# Patient Record
Sex: Female | Born: 1970
Health system: Southern US, Community
[De-identification: ages and names within clinical notes are randomized; demographics above are authoritative.]

## PROBLEM LIST (undated history)

## (undated) DIAGNOSIS — F419 Anxiety disorder, unspecified: Secondary | ICD-10-CM

## (undated) DIAGNOSIS — K625 Hemorrhage of anus and rectum: Secondary | ICD-10-CM

## (undated) DIAGNOSIS — F410 Panic disorder [episodic paroxysmal anxiety] without agoraphobia: Secondary | ICD-10-CM

## (undated) DIAGNOSIS — I10 Essential (primary) hypertension: Secondary | ICD-10-CM

## (undated) DIAGNOSIS — K648 Other hemorrhoids: Secondary | ICD-10-CM

## (undated) DIAGNOSIS — R5383 Other fatigue: Secondary | ICD-10-CM

## (undated) DIAGNOSIS — F32A Depression, unspecified: Secondary | ICD-10-CM

## (undated) DIAGNOSIS — R300 Dysuria: Secondary | ICD-10-CM

## (undated) DIAGNOSIS — N189 Chronic kidney disease, unspecified: Secondary | ICD-10-CM

## (undated) DIAGNOSIS — E669 Obesity, unspecified: Secondary | ICD-10-CM

## (undated) DIAGNOSIS — Z87898 Personal history of other specified conditions: Secondary | ICD-10-CM

## (undated) DIAGNOSIS — E119 Type 2 diabetes mellitus without complications: Secondary | ICD-10-CM

## (undated) DIAGNOSIS — I517 Cardiomegaly: Secondary | ICD-10-CM

## (undated) DIAGNOSIS — D649 Anemia, unspecified: Secondary | ICD-10-CM

## (undated) DIAGNOSIS — R0602 Shortness of breath: Secondary | ICD-10-CM

## (undated) DIAGNOSIS — K635 Polyp of colon: Secondary | ICD-10-CM

## (undated) DIAGNOSIS — A5901 Trichomonal vulvovaginitis: Secondary | ICD-10-CM

## (undated) DIAGNOSIS — E8940 Asymptomatic postprocedural ovarian failure: Secondary | ICD-10-CM

## (undated) DIAGNOSIS — E785 Hyperlipidemia, unspecified: Secondary | ICD-10-CM

## (undated) DIAGNOSIS — Z7251 High risk heterosexual behavior: Secondary | ICD-10-CM

## (undated) DIAGNOSIS — R5381 Other malaise: Secondary | ICD-10-CM

## (undated) DIAGNOSIS — F329 Major depressive disorder, single episode, unspecified: Secondary | ICD-10-CM

## (undated) DIAGNOSIS — E282 Polycystic ovarian syndrome: Secondary | ICD-10-CM

## (undated) DIAGNOSIS — N838 Other noninflammatory disorders of ovary, fallopian tube and broad ligament: Secondary | ICD-10-CM

## (undated) HISTORY — DX: Hyperlipidemia, unspecified: E78.5

## (undated) HISTORY — DX: Other hemorrhoids: K64.8

## (undated) HISTORY — PX: ABDOMINAL HYSTERECTOMY: SHX81

## (undated) HISTORY — DX: Obesity, unspecified: E66.9

## (undated) HISTORY — DX: Other malaise: R53.81

## (undated) HISTORY — DX: Asymptomatic postprocedural ovarian failure: E89.40

## (undated) HISTORY — DX: Major depressive disorder, single episode, unspecified: F32.9

## (undated) HISTORY — DX: Cardiomegaly: I51.7

## (undated) HISTORY — DX: Personal history of other specified conditions: Z87.898

## (undated) HISTORY — DX: Depression, unspecified: F32.A

## (undated) HISTORY — DX: Chronic kidney disease, unspecified: N18.9

## (undated) HISTORY — DX: High risk heterosexual behavior: Z72.51

## (undated) HISTORY — DX: Other noninflammatory disorders of ovary, fallopian tube and broad ligament: N83.8

## (undated) HISTORY — DX: Anxiety disorder, unspecified: F41.9

## (undated) HISTORY — DX: Panic disorder (episodic paroxysmal anxiety): F41.0

## (undated) HISTORY — DX: Anemia, unspecified: D64.9

## (undated) HISTORY — DX: Polycystic ovarian syndrome: E28.2

## (undated) HISTORY — PX: TUBAL LIGATION: SHX77

## (undated) HISTORY — DX: Hemorrhage of anus and rectum: K62.5

## (undated) HISTORY — DX: Shortness of breath: R06.02

## (undated) HISTORY — PX: APPENDECTOMY: SHX54

## (undated) HISTORY — DX: Trichomonal vulvovaginitis: A59.01

## (undated) HISTORY — DX: Polyp of colon: K63.5

## (undated) HISTORY — DX: Dysuria: R30.0

## (undated) HISTORY — PX: CHOLECYSTECTOMY: SHX55

## (undated) HISTORY — DX: Other fatigue: R53.83

---

## 2012-12-21 ENCOUNTER — Other Ambulatory Visit: Payer: Self-pay

## 2012-12-21 DIAGNOSIS — Z1231 Encounter for screening mammogram for malignant neoplasm of breast: Secondary | ICD-10-CM

## 2013-01-16 ENCOUNTER — Ambulatory Visit: Payer: Self-pay

## 2013-02-27 ENCOUNTER — Ambulatory Visit
Admission: RE | Admit: 2013-02-27 | Discharge: 2013-02-27 | Disposition: A | Discharge status: Home / Self Care | Payer: BC Managed Care – PPO | Source: Ambulatory Visit

## 2013-02-27 DIAGNOSIS — Z1231 Encounter for screening mammogram for malignant neoplasm of breast: Secondary | ICD-10-CM

## 2013-05-02 ENCOUNTER — Emergency Department (HOSPITAL_COMMUNITY): Payer: BC Managed Care – PPO

## 2013-05-02 ENCOUNTER — Emergency Department (HOSPITAL_COMMUNITY)
Admission: EM | Admit: 2013-05-02 | Discharge: 2013-05-02 | Disposition: A | Discharge status: Home / Self Care | Payer: BC Managed Care – PPO | Attending: Emergency Medicine | Admitting: Emergency Medicine

## 2013-05-02 ENCOUNTER — Encounter (HOSPITAL_COMMUNITY): Payer: Self-pay

## 2013-05-02 DIAGNOSIS — Y93K1 Activity, walking an animal: Secondary | ICD-10-CM | POA: Insufficient documentation

## 2013-05-02 DIAGNOSIS — Y929 Unspecified place or not applicable: Secondary | ICD-10-CM | POA: Insufficient documentation

## 2013-05-02 DIAGNOSIS — I10 Essential (primary) hypertension: Secondary | ICD-10-CM | POA: Insufficient documentation

## 2013-05-02 DIAGNOSIS — E119 Type 2 diabetes mellitus without complications: Secondary | ICD-10-CM | POA: Insufficient documentation

## 2013-05-02 DIAGNOSIS — M25511 Pain in right shoulder: Secondary | ICD-10-CM

## 2013-05-02 DIAGNOSIS — W108XXA Fall (on) (from) other stairs and steps, initial encounter: Secondary | ICD-10-CM | POA: Insufficient documentation

## 2013-05-02 DIAGNOSIS — S46909A Unspecified injury of unspecified muscle, fascia and tendon at shoulder and upper arm level, unspecified arm, initial encounter: Secondary | ICD-10-CM | POA: Insufficient documentation

## 2013-05-02 DIAGNOSIS — S4980XA Other specified injuries of shoulder and upper arm, unspecified arm, initial encounter: Secondary | ICD-10-CM | POA: Insufficient documentation

## 2013-05-02 HISTORY — DX: Essential (primary) hypertension: I10

## 2013-05-02 HISTORY — DX: Type 2 diabetes mellitus without complications: E11.9

## 2013-05-02 MED ORDER — HYDROCODONE-ACETAMINOPHEN 5-325 MG PO TABS: 1.0000 | ORAL_TABLET | Freq: Four times a day (QID) | ORAL | Status: DC | PRN

## 2013-05-02 MED ORDER — KETOROLAC TROMETHAMINE 60 MG/2ML IM SOLN
60.0000 mg | Freq: Once | INTRAMUSCULAR | Status: AC
  Administered 2013-05-02: 60 mg via INTRAMUSCULAR
  Filled 2013-05-02: qty 2

## 2013-05-02 NOTE — ED Provider Notes (Signed)
Medical screening examination/treatment/procedure(s) were performed by non-physician practitioner and as supervising physician I was immediately available for consultation/collaboration.  Veryl Speak, MD 05/02/13 [phone]

## 2013-05-02 NOTE — ED Notes (Signed)
Patient transported to X-ray 

## 2013-05-02 NOTE — ED Provider Notes (Signed)
CSN: [id number]     Arrival date & time 05/02/13  [id number] History   First MD Initiated Contact with Patient 05/02/13 0654     Chief Complaint  Patient presents with  . Shoulder Pain   (Consider location/radiation/quality/duration/timing/severity/associated sxs/prior Treatment) HPI Comments: Patient is a 42 year old female with history of diabetes and hypertension who presents today with right shoulder pain after a fall last name. She was taking her dog for a walk when the dog pulled her. She landed on her right shoulder. No loss of consciousness, disorientation. She describes the pain in her shoulder as a toothache that does not radiate out of the shoulder. The pain is worse with movement. She is holding her arm in internal rotation and flexion. This is the most comfortable position. She took 800 mg of ibuprofen which did not help her pain. She has never injured the shoulder in the past. She denies fever, chills, nausea, vomiting abdominal pain, numbness, weakness, paresthesias.  Patient is a 42 y.o. female presenting with shoulder pain. The history is provided by the patient. No language interpreter was used.  Shoulder Pain Associated symptoms include arthralgias, joint swelling and myalgias. Pertinent negatives include no chills, fever, headaches, nausea or vomiting.    Past Medical History  Diagnosis Date  . Diabetes mellitus without complication   . Hypertension    Past Surgical History  Procedure Laterality Date  . Abdominal hysterectomy      partial  . Appendectomy    . Cholecystectomy     No family history on file. History  Substance Use Topics  . Smoking status: Never Smoker   . Smokeless tobacco: Not on file  . Alcohol Use: No   OB History   Grav Para Term Preterm Abortions TAB SAB Ect Mult Living                 Review of Systems  Constitutional: Negative for fever and chills.  Gastrointestinal: Negative for nausea and vomiting.  Musculoskeletal: Positive for myalgias,  joint swelling and arthralgias.  Neurological: Negative for headaches.  All other systems reviewed and are negative.    Allergies  Review of patient's allergies indicates no known allergies.  Home Medications  No current outpatient prescriptions on file. BP 176/100  Pulse 95  Temp(Src) 98.6 F (37 C) (Oral)  Resp 16  SpO2 98% Physical Exam  Nursing note and vitals reviewed. Constitutional: She is oriented to person, place, and time. She appears well-developed and well-nourished. No distress.  HENT:  Head: Normocephalic and atraumatic.  Right Ear: External ear normal.  Left Ear: External ear normal.  Nose: Nose normal.  Mouth/Throat: Oropharynx is clear and moist.  Eyes: Conjunctivae are normal.  Neck: Normal range of motion.  Cardiovascular: Normal rate, regular rhythm, normal heart sounds, intact distal pulses and normal pulses.   Pulmonary/Chest: Effort normal and breath sounds normal. No stridor. No respiratory distress. She has no wheezes. She has no rales.  Abdominal: Soft. She exhibits no distension.  Musculoskeletal:       Right shoulder: She exhibits decreased range of motion, tenderness, bony tenderness, swelling and pain. She exhibits normal pulse.  Neurological: She is alert and oriented to person, place, and time. She has normal strength.  Skin: Skin is warm and dry. She is not diaphoretic. No erythema.  Psychiatric: She has a normal mood and affect. Her behavior is normal.    ED Course  Procedures (including critical care time) Labs Review Labs Reviewed - No data to display Imaging  Review Dg Shoulder Right  05/02/2013   CLINICAL DATA:  [place] down stairs. Right shoulder injury and pain.  EXAM: RIGHT SHOULDER - 2+ VIEW  COMPARISON:  None.  FINDINGS: There is no evidence of fracture or dislocation. There is no evidence of arthropathy or other focal bone abnormality. Soft tissues are unremarkable.  IMPRESSION: Negative.   Electronically Signed   By: Earle Gell    On: 05/02/2013 08:15    MDM   1. Right shoulder pain    Patient with right shoulder pain after fall. ROM limited. Neurovascularly intact. Compartment soft. Patient feeling some improvement after toradol. XR negative for fx, dislocation, or subluxation. Will given ortho referral if sx are not improving. Return instructions given. Vital signs stable for discharge. Patient / Family / Caregiver informed of clinical course, understand medical decision-making process, and agree with plan.     Elwyn Lade, PA-C 05/02/13 [phone]

## 2013-05-02 NOTE — ED Notes (Signed)
Pt. Reports that last night she fell down the steps and landed on right shoulder. Right shoulder swollen.

## 2013-05-02 NOTE — ED Notes (Signed)
Pillows given for support and ice pack applied to shoulder. Redness noted along right side base of neck with tenderness.

## 2013-05-02 NOTE — ED Notes (Signed)
Pt. Fell down stairs at apt complex last night and hit her right shoulder. Swelling noted, denies numbness/tingling distal to injury, radial pulse strong, cap refill instant. Pt. Took 800mg  ibuprofen around 0300.

## 2014-01-05 ENCOUNTER — Ambulatory Visit: Payer: Self-pay | Admitting: Podiatrist

## 2014-03-01 ENCOUNTER — Ambulatory Visit: Payer: Self-pay | Admitting: Podiatrist

## 2014-08-07 ENCOUNTER — Other Ambulatory Visit: Payer: Self-pay

## 2014-08-07 DIAGNOSIS — N644 Mastodynia: Secondary | ICD-10-CM

## 2014-10-10 DIAGNOSIS — R55 Syncope and collapse: Secondary | ICD-10-CM | POA: Insufficient documentation

## 2014-10-10 DIAGNOSIS — D72829 Elevated white blood cell count, unspecified: Secondary | ICD-10-CM | POA: Insufficient documentation

## 2014-10-10 DIAGNOSIS — N179 Acute kidney failure, unspecified: Secondary | ICD-10-CM | POA: Insufficient documentation

## 2015-06-24 ENCOUNTER — Emergency Department (HOSPITAL_COMMUNITY)
Admission: EM | Admit: 2015-06-24 | Discharge: 2015-06-24 | Disposition: A | Discharge status: Home / Self Care | Payer: BLUE CROSS/BLUE SHIELD | Attending: Emergency Medicine | Admitting: Emergency Medicine

## 2015-06-24 ENCOUNTER — Encounter (HOSPITAL_COMMUNITY): Payer: Self-pay | Admitting: Cardiology

## 2015-06-24 ENCOUNTER — Emergency Department (HOSPITAL_COMMUNITY): Payer: BLUE CROSS/BLUE SHIELD

## 2015-06-24 DIAGNOSIS — R0789 Other chest pain: Secondary | ICD-10-CM | POA: Diagnosis not present

## 2015-06-24 DIAGNOSIS — E1165 Type 2 diabetes mellitus with hyperglycemia: Secondary | ICD-10-CM | POA: Diagnosis not present

## 2015-06-24 DIAGNOSIS — Z79899 Other long term (current) drug therapy: Secondary | ICD-10-CM | POA: Insufficient documentation

## 2015-06-24 DIAGNOSIS — R739 Hyperglycemia, unspecified: Secondary | ICD-10-CM

## 2015-06-24 DIAGNOSIS — I10 Essential (primary) hypertension: Secondary | ICD-10-CM | POA: Diagnosis not present

## 2015-06-24 DIAGNOSIS — I160 Hypertensive urgency: Secondary | ICD-10-CM

## 2015-06-24 LAB — BASIC METABOLIC PANEL
Anion gap: 12 (ref 5–15)
BUN: 14 mg/dL (ref 6–20)
CO2: 21 mmol/L — ABNORMAL LOW (ref 22–32)
Calcium: 10.5 mg/dL — ABNORMAL HIGH (ref 8.9–10.3)
Chloride: 100 mmol/L — ABNORMAL LOW (ref 101–111)
Creatinine, Ser: 1.31 mg/dL — ABNORMAL HIGH (ref 0.44–1.00)
GFR calc Af Amer: 56 mL/min — ABNORMAL LOW (ref >60)
GFR, EST NON AFRICAN AMERICAN: 48 mL/min — AB (ref >60)
Glucose, Bld: 319 mg/dL — ABNORMAL HIGH (ref 65–99)
POTASSIUM: 3.7 mmol/L (ref 3.5–5.1)
SODIUM: 133 mmol/L — AB (ref 135–145)

## 2015-06-24 LAB — URINALYSIS, ROUTINE W REFLEX MICROSCOPIC
BILIRUBIN URINE: NEGATIVE
Glucose, UA: >1000 mg/dL — AB
Hgb urine dipstick: NEGATIVE
KETONES UR: NEGATIVE mg/dL
Leukocytes, UA: NEGATIVE
NITRITE: NEGATIVE
PROTEIN: 30 mg/dL — AB
SPECIFIC GRAVITY, URINE: 1.025 (ref 1.005–1.030)
UROBILINOGEN UA: 0.2 mg/dL (ref 0.0–1.0)
pH: 5 (ref 5.0–8.0)

## 2015-06-24 LAB — CBG MONITORING, ED
GLUCOSE-CAPILLARY: 318 mg/dL — AB (ref 65–99)
Glucose-Capillary: 247 mg/dL — ABNORMAL HIGH (ref 65–99)
Glucose-Capillary: 250 mg/dL — ABNORMAL HIGH (ref 65–99)

## 2015-06-24 LAB — CBC
HEMATOCRIT: 37.5 % (ref 36.0–46.0)
Hemoglobin: 12.5 g/dL (ref 12.0–15.0)
MCH: 27.1 pg (ref 26.0–34.0)
MCHC: 33.3 g/dL (ref 30.0–36.0)
MCV: 81.2 fL (ref 78.0–100.0)
PLATELETS: 307 10*3/uL (ref 150–400)
RBC: 4.62 MIL/uL (ref 3.87–5.11)
RDW: 14.3 % (ref 11.5–15.5)
WBC: 11.1 10*3/uL — AB (ref 4.0–10.5)

## 2015-06-24 LAB — TROPONIN I: Troponin I: <0.03 ng/mL (ref <0.031)

## 2015-06-24 LAB — URINE MICROSCOPIC-ADD ON

## 2015-06-24 MED ORDER — AMLODIPINE BESYLATE 5 MG PO TABS: 10.0000 mg | ORAL_TABLET | Freq: Two times a day (BID) | ORAL | Status: DC

## 2015-06-24 MED ORDER — LOSARTAN POTASSIUM 50 MG PO TABS
100.0000 mg | ORAL_TABLET | Freq: Every day | ORAL | Status: DC
  Filled 2015-06-24: qty 2

## 2015-06-24 MED ORDER — SODIUM CHLORIDE 0.9 % IV BOLUS (SEPSIS)
1000.0000 mL | Freq: Once | INTRAVENOUS | Status: AC
  Administered 2015-06-24: 1000 mL via INTRAVENOUS

## 2015-06-24 MED ORDER — CITALOPRAM HYDROBROMIDE 10 MG PO TABS: 10.0000 mg | ORAL_TABLET | Freq: Every day | ORAL | Status: DC

## 2015-06-24 MED ORDER — LOSARTAN POTASSIUM-HCTZ 100-12.5 MG PO TABS: 1.0000 | ORAL_TABLET | Freq: Every day | ORAL | Status: DC

## 2015-06-24 MED ORDER — METFORMIN HCL 500 MG PO TABS: 500.0000 mg | ORAL_TABLET | Freq: Two times a day (BID) | ORAL | Status: DC

## 2015-06-24 MED ORDER — HYDROCHLOROTHIAZIDE 12.5 MG PO CAPS: 12.5000 mg | ORAL_CAPSULE | Freq: Every day | ORAL | Status: DC

## 2015-06-24 NOTE — ED Notes (Signed)
Discharge instructions reviewed - voiced understanding.  Patient will call to make an appointment

## 2015-06-24 NOTE — ED Notes (Signed)
Pt CBG is 250. Nurse notified.

## 2015-06-24 NOTE — Discharge Instructions (Signed)
Blood Glucose Monitoring, Adult Monitoring your blood glucose (also know as blood sugar) helps you to manage your diabetes. It also helps you and your health care provider monitor your diabetes and determine how well your treatment plan is working. WHY SHOULD YOU MONITOR YOUR BLOOD GLUCOSE?  It can help you understand how food, exercise, and medicine affect your blood glucose.  It allows you to know what your blood glucose is at any given moment. You can quickly tell if you are having low blood glucose (hypoglycemia) or high blood glucose (hyperglycemia).  It can help you and your health care provider know how to adjust your medicines.  It can help you understand how to manage an illness or adjust medicine for exercise. WHEN SHOULD YOU TEST? Your health care provider will help you decide how often you should check your blood glucose. This may depend on the type of diabetes you have, your diabetes control, or the types of medicines you are taking. Be sure to write down all of your blood glucose readings so that this information can be reviewed with your health care provider. See below for examples of testing times that your health care provider may suggest. Type 1 Diabetes  Test at least 2 times per day if your diabetes is well controlled, if you are using an insulin pump, or if you perform multiple daily injections.  If your diabetes is not well controlled or if you are sick, you may need to test more often.  It is a good idea to also test:  Before every insulin injection.  Before and after exercise.  Between meals and 2 hours after a meal.  Occasionally between 2:00 a.m. and 3:00 a.m. Type 2 Diabetes  If you are taking insulin, test at least 2 times per day. However, it is best to test before every insulin injection.  If you take medicines by mouth (orally), test 2 times a day.  If you are on a controlled diet, test once a day.  If your diabetes is not well controlled or if you  are sick, you may need to monitor more often. HOW TO MONITOR YOUR BLOOD GLUCOSE Supplies Needed  Blood glucose meter.  Test strips for your meter. Each meter has its own strips. You must use the strips that go with your own meter.  A pricking needle (lancet).  A device that holds the lancet (lancing device).  A journal or log book to write down your results. Procedure  Wash your hands with soap and water. Alcohol is not preferred.  Prick the side of your finger (not the tip) with the lancet.  Gently milk the finger until a small drop of blood appears.  Follow the instructions that come with your meter for inserting the test strip, applying blood to the strip, and using your blood glucose meter. Other Areas to Get Blood for Testing Some meters allow you to use other areas of your body (other than your finger) to test your blood. These areas are called alternative sites. The most common alternative sites are:  The forearm.  The thigh.  The back area of the lower leg.  The palm of the hand. The blood flow in these areas is slower. Therefore, the blood glucose values you get may be delayed, and the numbers are different from what you would get from your fingers. Do not use alternative sites if you think you are having hypoglycemia. Your reading will not be accurate. Always use a finger if you are  having hypoglycemia. Also, if you cannot feel your lows (hypoglycemia unawareness), always use your fingers for your blood glucose checks. ADDITIONAL TIPS FOR GLUCOSE MONITORING  Do not reuse lancets.  Always carry your supplies with you.  All blood glucose meters have a 24-hour "hotline" number to call if you have questions or need help.  Adjust (calibrate) your blood glucose meter with a control solution after finishing a few boxes of strips. BLOOD GLUCOSE RECORD KEEPING It is a good idea to keep a daily record or log of your blood glucose readings. Most glucose meters, if not all,  keep your glucose records stored in the meter. Some meters come with the ability to download your records to your home computer. Keeping a record of your blood glucose readings is especially helpful if you are wanting to look for patterns. Make notes to go along with the blood glucose readings because you might forget what happened at that exact time. Keeping good records helps you and your health care provider to work together to achieve good diabetes management.    This information is not intended to replace advice given to you by your health care provider. Make sure you discuss any questions you have with your health care provider.   Document Released: 08/20/2003 Document Revised: 09/07/2014 Document Reviewed: 01/09/2013 Elsevier Interactive Patient Education 2016 Elsevier Inc.  Chest Wall Pain Chest wall pain is pain in or around the bones and muscles of your chest. Sometimes, an injury causes this pain. Sometimes, the cause may not be known. This pain may take several weeks or longer to get better. HOME CARE INSTRUCTIONS  Pay attention to any changes in your symptoms. Take these actions to help with your pain:   Rest as told by your health care provider.   Avoid activities that cause pain. These include any activities that use your chest muscles or your abdominal and side muscles to lift heavy items.   If directed, apply ice to the painful area:  Put ice in a plastic bag.  Place a towel between your skin and the bag.  Leave the ice on for 20 minutes, 2-3 times per day.  Take over-the-counter and prescription medicines only as told by your health care provider.  Do not use tobacco products, including cigarettes, chewing tobacco, and e-cigarettes. If you need help quitting, ask your health care provider.  Keep all follow-up visits as told by your health care provider. This is important. SEEK MEDICAL CARE IF:  You have a fever.  Your chest pain becomes worse.  You have new  symptoms. SEEK IMMEDIATE MEDICAL CARE IF:  You have nausea or vomiting.  You feel sweaty or light-headed.  You have a cough with phlegm (sputum) or you cough up blood.  You develop shortness of breath.   This information is not intended to replace advice given to you by your health care provider. Make sure you discuss any questions you have with your health care provider.   Document Released: 08/17/2005 Document Revised: 05/08/2015 Document Reviewed: 11/12/2014 Elsevier Interactive Patient Education [employer].

## 2015-06-24 NOTE — ED Notes (Signed)
Reports she has been feeling bad since Thursday. Her CBG was 501 at work that night, has been running high since then. Reports feeling tried, and nauseated.

## 2015-06-24 NOTE — ED Provider Notes (Signed)
CSN: [id number]     Arrival date & time 06/24/15  1358 History   First MD Initiated Contact with Patient 06/24/15 1542     Chief Complaint  Patient presents with  . Hyperglycemia   HPI Patient presents the emergency department for assessment of fatigue and nausea and chest tightness the past 3 days. Patient states that her blood sugars have been significantly elevated running above 500s past 3 days as well. States that fatigue and nausea are also associated with an increase urinary frequency that she states is related. Patient denies dysuria or blood in urine denies vaginal bleeding or vaginal discharge no pelvic or abdominal pain. Patient has no bilateral lower back pain or flank pain. Patient states chest pain is not related to exertion is described as a generalized tightness in chest. Patient has had this sensation in several times in past and has been worked up prior including an syncopal event she states during which they were unable to find any etiology. Patient states that she does not have menstrual periods any longer as she has had a bilateral tubal ligation and partial hysterectomy however ovaries are present. Patient takes no estrogen supplementation has been on no recent antibiotics and has no history of recurrent UTIs. Patient states no family history of cardiac disease and no sudden unexplained tests in family members. States she has no sensation of chest pain at this time and took nothing to resolve this.  Past Medical History  Diagnosis Date  . Diabetes mellitus without complication ([place])   . Hypertension    Past Surgical History  Procedure Laterality Date  . Abdominal hysterectomy      partial  . Appendectomy    . Cholecystectomy     History reviewed. No pertinent family history. Social History  Substance Use Topics  . Smoking status: Never Smoker   . Smokeless tobacco: None  . Alcohol Use: No   OB History    No data available     Review of Systems  Constitutional:  Positive for fatigue. Negative for fever.  Respiratory: Positive for chest tightness. Negative for cough, choking, shortness of breath, wheezing and stridor.   Cardiovascular: Negative for chest pain, palpitations and leg swelling.  Gastrointestinal: Positive for nausea. Negative for vomiting, abdominal pain, diarrhea, blood in stool and abdominal distention.  Genitourinary: Positive for frequency. Negative for dysuria, urgency, hematuria, flank pain, decreased urine volume, vaginal bleeding, vaginal discharge, difficulty urinating, vaginal pain, menstrual problem and pelvic pain.  Musculoskeletal: Negative for back pain, neck pain and neck stiffness.  Skin: Negative for rash and wound.  Neurological: Negative for dizziness, seizures, syncope, weakness, light-headedness, numbness and headaches.  All other systems reviewed and are negative.  Allergies  Review of patient's allergies indicates no known allergies.  Home Medications   Prior to Admission medications   Medication Sig Start Date End Date Taking? Authorizing Provider  ALPRAZolam Duanne Moron) 0.5 MG tablet Take 0.5 mg by mouth 3 (three) times daily as needed for anxiety or sleep.   Yes Historical Provider, MD  amLODipine (NORVASC) 10 MG tablet Take 10 mg by mouth 2 (two) times daily.    Yes Historical Provider, MD  losartan-hydrochlorothiazide (HYZAAR) 100-12.5 MG tablet Take 1 tablet by mouth daily.   Yes Historical Provider, MD  metFORMIN (GLUCOPHAGE) 500 MG tablet Take 500 mg by mouth 2 (two) times daily with a meal.   Yes Historical Provider, MD  HYDROcodone-acetaminophen (NORCO/VICODIN) 5-325 MG per tablet Take 1 tablet by mouth every 6 (six) hours  as needed for pain. 05/02/13   Cleatrice Burke, PA-C  ibuprofen (ADVIL,MOTRIN) 200 MG tablet Take 800 mg by mouth every 6 (six) hours as needed for pain.    Historical Provider, MD   BP 173/93 mmHg  Pulse 89  Temp(Src) 98.2 F (36.8 C) (Oral)  Resp 17  Ht 5\' 6"  (1.676 m)  Wt 208 lb 6 oz  (94.518 kg)  BMI 33.65 kg/m2  SpO2 100% Physical Exam  Constitutional: She is oriented to person, place, and time. She appears well-developed and well-nourished.  Non-toxic appearance. She does not appear ill. No distress.  HENT:  Head: Normocephalic and atraumatic.  Right Ear: External ear normal.  Left Ear: External ear normal.  Eyes: Pupils are equal, round, and reactive to light. No scleral icterus.  Neck: Normal range of motion. Neck supple. No tracheal deviation present.  Cardiovascular: Normal heart sounds and intact distal pulses.   No murmur heard. Pulmonary/Chest: Effort normal and breath sounds normal. No stridor. No respiratory distress. She has no wheezes. She has no rales.  Abdominal: Soft. Bowel sounds are normal. She exhibits no distension. There is no tenderness. There is no rebound and no guarding.  Musculoskeletal: Normal range of motion.  Neurological: She is alert and oriented to person, place, and time. She has normal strength and normal reflexes. No cranial nerve deficit or sensory deficit.  Skin: Skin is warm and dry. No pallor.  Psychiatric: She has a normal mood and affect. Her behavior is normal.    ED Course  Procedures (including critical care time) Labs Review Labs Reviewed  BASIC METABOLIC PANEL - Abnormal; Notable for the following:    Sodium 133 (*)    Chloride 100 (*)    CO2 21 (*)    Glucose, Bld 319 (*)    Creatinine, Ser 1.31 (*)    Calcium 10.5 (*)    GFR calc non Af Amer 48 (*)    GFR calc Af Amer 56 (*)    All other components within normal limits  CBC - Abnormal; Notable for the following:    WBC 11.1 (*)    All other components within normal limits  URINALYSIS, ROUTINE W REFLEX MICROSCOPIC (NOT AT [facility]) - Abnormal; Notable for the following:    APPearance HAZY (*)    Glucose, UA >1000 (*)    Protein, ur 30 (*)    All other components within normal limits  URINE MICROSCOPIC-ADD ON - Abnormal; Notable for the following:    Squamous  Epithelial / LPF MANY (*)    Bacteria, UA FEW (*)    Casts HYALINE CASTS (*)    All other components within normal limits  CBG MONITORING, ED - Abnormal; Notable for the following:    Glucose-Capillary 318 (*)    All other components within normal limits  CBG MONITORING, ED - Abnormal; Notable for the following:    Glucose-Capillary 247 (*)    All other components within normal limits  CBG MONITORING, ED - Abnormal; Notable for the following:    Glucose-Capillary 250 (*)    All other components within normal limits  URINE CULTURE  TROPONIN I    Imaging Review Dg Chest 1 View  06/24/2015  CLINICAL DATA:  Hyperglycemia today, chest pain, shortness of breath, hypertension, diabetes mellitus EXAM: CHEST 1 VIEW COMPARISON:  None FINDINGS: Upper normal heart size. Mediastinal contours and pulmonary vascularity normal. Minimal peribronchial thickening. No pulmonary infiltrate, pleural effusion or pneumothorax. Bones unremarkable. IMPRESSION: Minimal bronchitic changes without infiltrate. Electronically  Signed   By: Lavonia Dana M.D.   On: 06/24/2015 16:58   I have personally reviewed and evaluated these images and lab results as part of my medical decision-making.   EKG Interpretation   Date/Time:  Monday June 24 2015 15:51:35 EDT Ventricular Rate:  83 PR Interval:  154 QRS Duration: 77 QT Interval:  381 QTC Calculation: 448 R Axis:   45 Text Interpretation:  Sinus rhythm No acute changes No old tracing to  compare Confirmed by Kathrynn Humble, MD, Thelma Comp [phone]) on 06/24/2015 4:28:47 PM      MDM   Casey Acosta is a 44 y.o. female with H&P as above. ED clinical course as follows:  DDx includes medication non-compliance, infection (PNA, UTI, Cellulitis), steroid induced, or DKA and HHNKS.  Patient is known Type 2 Diabetic.  Regimen involves metformin, with endorsed good compliance. No recent change in medications and no recent steroid course.  Patient without evidence of  acidosis or other metabolic abnormalities, and given without Hx of insulin dependence do not suspect DKA. HHNKS also unlikely.  CBC reveals WBC unremarkable. Exam reveals no concerning source of infection. Without evidence of acute infection on UA or CXR. Without AMS, hypotension or poor clinical perfusion therefore Sepsis or Septic shock not likely.  ED course inconsistent with considered emergent conditions, and thus I believe a low risk diagnosis much more likely.  Symptoms are controlled with IVF boluses which partial resolved the levated BGs.   Given atypical nature and low cardiac Hx do not suspect etiology of chest discomfort is cardiac.  Required no ASA or nitro given atypical symptoms.   Without peritoneal signs, rebound, or guarding on exam; do not suspect Intraabdominal source.  ECG is wthout anatomical ST segment or T wave changes of myocardial ischemia.  No evidence of Pericarditis.  Without leukocytosis, cough, or fever. Chest x-ray was done and evaluated by me, which showed no signs of focal airspace disease or infiltrate concerning for Pneumonia. Without Pneumothorax.   Patient is PERC negative, and low risk per [place] and have low suspicion for Pulmonary Embolism.    Patient is (total 3) low risk HEART score and had delta troponins which were both negative.  Clinical Impression:  1. Hypertensive urgency   2. Atypical chest pain   3. Hyperglycemia without ketosis    Disposition:  I explained the differential diagnoses along with likely diagnosis and have given explicit precautions to return to the ER including any other new or worsening symptoms. The patient understands and I both nursing and I confirmed there are no further concerns or questions. Discharge instructions concerning symptomatic care and follow up have been given.  The patient is STABLE and is discharged to home in good condition.  Laboratory and Imaging results were personally reviewed by myself and  used in the medical decision making of this patient's treatment and disposition. I also reviewed Radiology's interpretation of Imaging.   Patient care discussed with Dr. Kathrynn Humble, who oversaw their evaluation & treatment & voiced agreement. House Officer: Voncille Lo, MD, Emergency Medicine.  Voncille Lo, MD 06/25/15 [place], MD 07/12/15 1517

## 2015-06-25 ENCOUNTER — Ambulatory Visit: Payer: Self-pay | Admitting: Podiatry

## 2015-06-26 LAB — URINE CULTURE

## 2015-06-28 ENCOUNTER — Encounter: Payer: Self-pay | Admitting: Cardiovascular Disease

## 2015-07-11 ENCOUNTER — Ambulatory Visit (INDEPENDENT_AMBULATORY_CARE_PROVIDER_SITE_OTHER): Payer: BLUE CROSS/BLUE SHIELD | Admitting: Cardiology

## 2015-07-11 DIAGNOSIS — R072 Precordial pain: Secondary | ICD-10-CM

## 2015-07-11 NOTE — Progress Notes (Signed)
No show

## 2015-07-18 ENCOUNTER — Encounter: Payer: Self-pay | Admitting: Cardiology

## 2015-07-30 ENCOUNTER — Ambulatory Visit: Payer: Self-pay | Admitting: Podiatry

## 2017-10-16 ENCOUNTER — Emergency Department (HOSPITAL_COMMUNITY): Payer: BLUE CROSS/BLUE SHIELD

## 2017-10-16 ENCOUNTER — Emergency Department (HOSPITAL_COMMUNITY)
Admission: EM | Admit: 2017-10-16 | Discharge: 2017-10-16 | Disposition: A | Discharge status: Home / Self Care | Payer: BLUE CROSS/BLUE SHIELD | Attending: Emergency Medicine | Admitting: Emergency Medicine

## 2017-10-16 ENCOUNTER — Encounter (HOSPITAL_COMMUNITY): Payer: Self-pay | Admitting: Emergency Medicine

## 2017-10-16 DIAGNOSIS — N189 Chronic kidney disease, unspecified: Secondary | ICD-10-CM | POA: Insufficient documentation

## 2017-10-16 DIAGNOSIS — M25512 Pain in left shoulder: Secondary | ICD-10-CM | POA: Insufficient documentation

## 2017-10-16 DIAGNOSIS — I129 Hypertensive chronic kidney disease with stage 1 through stage 4 chronic kidney disease, or unspecified chronic kidney disease: Secondary | ICD-10-CM | POA: Insufficient documentation

## 2017-10-16 DIAGNOSIS — R319 Hematuria, unspecified: Secondary | ICD-10-CM | POA: Diagnosis not present

## 2017-10-16 DIAGNOSIS — Y9389 Activity, other specified: Secondary | ICD-10-CM | POA: Diagnosis not present

## 2017-10-16 DIAGNOSIS — G8911 Acute pain due to trauma: Secondary | ICD-10-CM | POA: Insufficient documentation

## 2017-10-16 DIAGNOSIS — Y999 Unspecified external cause status: Secondary | ICD-10-CM | POA: Diagnosis not present

## 2017-10-16 DIAGNOSIS — Z794 Long term (current) use of insulin: Secondary | ICD-10-CM | POA: Insufficient documentation

## 2017-10-16 DIAGNOSIS — R1032 Left lower quadrant pain: Secondary | ICD-10-CM | POA: Diagnosis present

## 2017-10-16 DIAGNOSIS — Z79899 Other long term (current) drug therapy: Secondary | ICD-10-CM | POA: Insufficient documentation

## 2017-10-16 DIAGNOSIS — S4992XA Unspecified injury of left shoulder and upper arm, initial encounter: Secondary | ICD-10-CM | POA: Diagnosis not present

## 2017-10-16 DIAGNOSIS — Y92410 Unspecified street and highway as the place of occurrence of the external cause: Secondary | ICD-10-CM | POA: Insufficient documentation

## 2017-10-16 DIAGNOSIS — E119 Type 2 diabetes mellitus without complications: Secondary | ICD-10-CM | POA: Diagnosis not present

## 2017-10-16 DIAGNOSIS — R300 Dysuria: Secondary | ICD-10-CM | POA: Insufficient documentation

## 2017-10-16 LAB — BASIC METABOLIC PANEL
Anion gap: 12 (ref 5–15)
BUN: 14 mg/dL (ref 6–20)
CO2: 27 mmol/L (ref 22–32)
CREATININE: 1.22 mg/dL — AB (ref 0.44–1.00)
Calcium: 9.5 mg/dL (ref 8.9–10.3)
Chloride: 96 mmol/L — ABNORMAL LOW (ref 101–111)
GFR, EST AFRICAN AMERICAN: 60 mL/min — AB (ref >60)
GFR, EST NON AFRICAN AMERICAN: 52 mL/min — AB (ref >60)
Glucose, Bld: 366 mg/dL — ABNORMAL HIGH (ref 65–99)
POTASSIUM: 3.4 mmol/L — AB (ref 3.5–5.1)
SODIUM: 135 mmol/L (ref 135–145)

## 2017-10-16 LAB — CBC
HEMATOCRIT: 37.3 % (ref 36.0–46.0)
HEMOGLOBIN: 12.6 g/dL (ref 12.0–15.0)
MCH: 26.9 pg (ref 26.0–34.0)
MCHC: 33.8 g/dL (ref 30.0–36.0)
MCV: 79.7 fL (ref 78.0–100.0)
Platelets: 377 10*3/uL (ref 150–400)
RBC: 4.68 MIL/uL (ref 3.87–5.11)
RDW: 14.2 % (ref 11.5–15.5)
WBC: 13.6 10*3/uL — ABNORMAL HIGH (ref 4.0–10.5)

## 2017-10-16 LAB — URINALYSIS, ROUTINE W REFLEX MICROSCOPIC
BILIRUBIN URINE: NEGATIVE
Glucose, UA: >=500 mg/dL — AB
Ketones, ur: NEGATIVE mg/dL
Leukocytes, UA: NEGATIVE
Nitrite: NEGATIVE
Protein, ur: 100 mg/dL — AB
Specific Gravity, Urine: 1.02 (ref 1.005–1.030)
pH: 6 (ref 5.0–8.0)

## 2017-10-16 LAB — URINALYSIS, MICROSCOPIC (REFLEX)
Bacteria, UA: NONE SEEN
SQUAMOUS EPITHELIAL / LPF: NONE SEEN
WBC, UA: NONE SEEN WBC/hpf (ref 0–5)

## 2017-10-16 LAB — POC URINE PREG, ED: PREG TEST UR: NEGATIVE

## 2017-10-16 MED ORDER — IOPAMIDOL (ISOVUE-300) INJECTION 61%
INTRAVENOUS | Status: AC
  Administered 2017-10-16: 100 mL
  Filled 2017-10-16: qty 100

## 2017-10-16 MED ORDER — CEPHALEXIN 500 MG PO CAPS: 500.0000 mg | ORAL_CAPSULE | Freq: Two times a day (BID) | ORAL | 0 refills | Status: AC

## 2017-10-16 NOTE — Discharge Instructions (Signed)
Your CT scan and blood work were reassuring.  You do have blood in your urine. We will treat you for a UTI. Please take antibiotic twice a day.   Please take all of your antibiotics until finished!   You may develop abdominal discomfort or diarrhea from the antibiotic.  You may help offset this with probiotics which you can buy or get in yogurt. Do not eat  or take the probiotics until 2 hours after your antibiotic.   Follow up with your regular doctor next week for recheck. Your blood pressure was elevated in the ER today, please have this rechecked.   Xray of your shoulder reassuring. No broken bones. Please apply ice twice a day for 40min at a time and take aleve as needed for pain.    Return to the ER if you have worsening abdominal pain, fever >100.81F or have any new or worsening symptoms
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
## 2017-10-16 NOTE — ED Notes (Signed)
PER PT'S REQUEST, WARM COMPRESS GIVEN AND APPLIED TO LEFT NECK AND SHOULDER AREA
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
## 2017-10-18 ENCOUNTER — Ambulatory Visit: Payer: Self-pay | Admitting: Podiatry

## 2017-10-18 LAB — URINE CULTURE

## 2018-02-23 ENCOUNTER — Encounter

## 2018-02-23 ENCOUNTER — Other Ambulatory Visit (INDEPENDENT_AMBULATORY_CARE_PROVIDER_SITE_OTHER): Payer: PRIVATE HEALTH INSURANCE

## 2018-02-23 ENCOUNTER — Encounter: Payer: Self-pay | Admitting: Nurse Practitioner

## 2018-02-23 ENCOUNTER — Ambulatory Visit: Payer: PRIVATE HEALTH INSURANCE | Admitting: Nurse Practitioner

## 2018-02-23 VITALS — BP 184/102 | HR 89 | Temp 98.7°F | Resp 16 | Ht 66.0 in | Wt 216.8 lb

## 2018-02-23 DIAGNOSIS — I10 Essential (primary) hypertension: Secondary | ICD-10-CM | POA: Diagnosis not present

## 2018-02-23 DIAGNOSIS — E119 Type 2 diabetes mellitus without complications: Secondary | ICD-10-CM

## 2018-02-23 DIAGNOSIS — F329 Major depressive disorder, single episode, unspecified: Secondary | ICD-10-CM

## 2018-02-23 DIAGNOSIS — E785 Hyperlipidemia, unspecified: Secondary | ICD-10-CM | POA: Diagnosis not present

## 2018-02-23 DIAGNOSIS — R350 Frequency of micturition: Secondary | ICD-10-CM | POA: Diagnosis not present

## 2018-02-23 DIAGNOSIS — E1165 Type 2 diabetes mellitus with hyperglycemia: Secondary | ICD-10-CM | POA: Insufficient documentation

## 2018-02-23 DIAGNOSIS — F419 Anxiety disorder, unspecified: Secondary | ICD-10-CM

## 2018-02-23 DIAGNOSIS — F32A Depression, unspecified: Secondary | ICD-10-CM | POA: Insufficient documentation

## 2018-02-23 LAB — COMPREHENSIVE METABOLIC PANEL
ALT: 16 U/L (ref 0–35)
AST: 12 U/L (ref 0–37)
Albumin: 3.9 g/dL (ref 3.5–5.2)
Alkaline Phosphatase: 41 U/L (ref 39–117)
BUN: 22 mg/dL (ref 6–23)
CHLORIDE: 99 meq/L (ref 96–112)
CO2: 26 meq/L (ref 19–32)
CREATININE: 1.47 mg/dL — AB (ref 0.40–1.20)
Calcium: 9.9 mg/dL (ref 8.4–10.5)
GFR: 48.66 mL/min — ABNORMAL LOW (ref >60.00)
GLUCOSE: 293 mg/dL — AB (ref 70–99)
Potassium: 3.4 mEq/L — ABNORMAL LOW (ref 3.5–5.1)
SODIUM: 133 meq/L — AB (ref 135–145)
Total Bilirubin: 0.3 mg/dL (ref 0.2–1.2)
Total Protein: 7.9 g/dL (ref 6.0–8.3)

## 2018-02-23 LAB — URINALYSIS, ROUTINE W REFLEX MICROSCOPIC
BILIRUBIN URINE: NEGATIVE
Hgb urine dipstick: NEGATIVE
KETONES UR: NEGATIVE
LEUKOCYTES UA: NEGATIVE
NITRITE: NEGATIVE
Urine Glucose: 100 — AB
Urobilinogen, UA: 0.2 (ref 0.0–1.0)
pH: 5.5 (ref 5.0–8.0)

## 2018-02-23 LAB — LIPID PANEL
CHOLESTEROL: 327 mg/dL — AB (ref 0–200)
HDL: 36.4 mg/dL — ABNORMAL LOW (ref >39.00)
Total CHOL/HDL Ratio: 9
Triglycerides: 453 mg/dL — ABNORMAL HIGH (ref 0.0–149.0)

## 2018-02-23 LAB — MICROALBUMIN / CREATININE URINE RATIO
Creatinine,U: 269 mg/dL
Microalb Creat Ratio: 35.3 mg/g — ABNORMAL HIGH (ref 0.0–30.0)
Microalb, Ur: 95.1 mg/dL — ABNORMAL HIGH (ref 0.0–1.9)

## 2018-02-23 LAB — CBC
HEMATOCRIT: 34.5 % — AB (ref 36.0–46.0)
Hemoglobin: 11.5 g/dL — ABNORMAL LOW (ref 12.0–15.0)
MCHC: 33.4 g/dL (ref 30.0–36.0)
MCV: 80 fl (ref 78.0–100.0)
Platelets: 357 10*3/uL (ref 150.0–400.0)
RBC: 4.32 Mil/uL (ref 3.87–5.11)
RDW: 14.3 % (ref 11.5–15.5)
WBC: 11.9 10*3/uL — ABNORMAL HIGH (ref 4.0–10.5)

## 2018-02-23 LAB — LDL CHOLESTEROL, DIRECT: Direct LDL: 184 mg/dL

## 2018-02-23 MED ORDER — OLMESARTAN MEDOXOMIL-HCTZ 40-25 MG PO TABS: 1.0000 | ORAL_TABLET | Freq: Every day | ORAL | 1 refills | Status: DC

## 2018-02-23 MED ORDER — ESCITALOPRAM OXALATE 10 MG PO TABS: 10.0000 mg | ORAL_TABLET | Freq: Every day | ORAL | 1 refills | Status: DC

## 2018-02-23 MED ORDER — METOPROLOL SUCCINATE ER 100 MG PO TB24: 100.0000 mg | ORAL_TABLET | Freq: Every day | ORAL | 1 refills | Status: DC

## 2018-02-23 MED ORDER — AMLODIPINE BESYLATE 10 MG PO TABS: 10.0000 mg | ORAL_TABLET | Freq: Every day | ORAL | 1 refills | Status: DC

## 2018-02-23 NOTE — Patient Instructions (Addendum)
Please head downstairs for lab work. If any of your test results are critically abnormal, you will be contacted right away. Otherwise, I will contact you within a week about your test results and any recommendations for abnormalities.  TODAY: Please resume your amlodipine 10, metoprolol succinate 100 daily. Please START benicar-HCT 40-25 daily. Please try to check your blood pressure once daily or at least a few times a week, at the same time each day, and keep a log.  NEXT WEEK:  I have sent a prescription for lexapro 10mg  tablets to your pharmacy. Please start 1/2 tablet once daily for 1 week and then increase to a full tablet once daily on week two as tolerated.  Some side effects such as nausea, drowsiness and weight gain can occur.  Also rarely people have experienced suicidal thoughts when taking this medication.  Please discontinue the medication and go directly to ED if this occurs.    Come back and see me in 2 weeks, so I can see how you are doing.   Mediterranean Diet A Mediterranean diet refers to food and lifestyle choices that are based on the traditions of countries located on the [employer]. This way of eating has been shown to help prevent certain conditions and improve outcomes for people who have chronic diseases, like kidney disease and heart disease. What are tips for following this plan? Lifestyle  Cook and eat meals together with your family, when possible.  Drink enough fluid to keep your urine clear or pale yellow.  Be physically active every day. This includes: ? Aerobic exercise like running or swimming. ? Leisure activities like gardening, walking, or housework.  Get 7-8 hours of sleep each night.  If recommended by your health care provider, drink red wine in moderation. This means 1 glass a day for nonpregnant women and 2 glasses a day for men. A glass of wine equals 5 oz (150 mL). Reading food labels  Check the serving size of packaged foods. For  foods such as rice and pasta, the serving size refers to the amount of cooked product, not dry.  Check the total fat in packaged foods. Avoid foods that have saturated fat or trans fats.  Check the ingredients list for added sugars, such as corn syrup. Shopping  At the grocery store, buy most of your food from the areas near the walls of the store. This includes: ? Fresh fruits and vegetables (produce). ? Grains, beans, nuts, and seeds. Some of these may be available in unpackaged forms or large amounts (in bulk). ? Fresh seafood. ? Poultry and eggs. ? Low-fat dairy products.  Buy whole ingredients instead of prepackaged foods.  Buy fresh fruits and vegetables in-season from local farmers markets.  Buy frozen fruits and vegetables in resealable bags.  If you do not have access to quality fresh seafood, buy precooked frozen shrimp or canned fish, such as tuna, salmon, or sardines.  Buy small amounts of raw or cooked vegetables, salads, or olives from the deli or salad bar at your store.  Stock your pantry so you always have certain foods on hand, such as olive oil, canned tuna, canned tomatoes, rice, pasta, and beans. Cooking  Cook foods with extra-virgin olive oil instead of using butter or other vegetable oils.  Have meat as a side dish, and have vegetables or grains as your main dish. This means having meat in small portions or adding small amounts of meat to foods like pasta or stew.  Use beans  or vegetables instead of meat in common dishes like chili or lasagna.  Experiment with different cooking methods. Try roasting or broiling vegetables instead of steaming or sauteing them.  Add frozen vegetables to soups, stews, pasta, or rice.  Add nuts or seeds for added healthy fat at each meal. You can add these to yogurt, salads, or vegetable dishes.  Marinate fish or vegetables using olive oil, lemon juice, garlic, and fresh herbs. Meal planning  Plan to eat 1 vegetarian meal  one day each week. Try to work up to 2 vegetarian meals, if possible.  Eat seafood 2 or more times a week.  Have healthy snacks readily available, such as: ? Vegetable sticks with hummus. ? [place] yogurt. ? Fruit and nut trail mix.  Eat balanced meals throughout the week. This includes: ? Fruit: 2-3 servings a day ? Vegetables: 4-5 servings a day ? Low-fat dairy: 2 servings a day ? Fish, poultry, or lean meat: 1 serving a day ? Beans and legumes: 2 or more servings a week ? Nuts and seeds: 1-2 servings a day ? Whole grains: 6-8 servings a day ? Extra-virgin olive oil: 3-4 servings a day  Limit red meat and sweets to only a few servings a month What are my food choices?  Mediterranean diet ? Recommended ? Grains: Whole-grain pasta. Brown rice. Bulgar wheat. Polenta. Couscous. Whole-wheat bread. [place]. ? Vegetables: Artichokes. Beets. Broccoli. Cabbage. Carrots. Eggplant. Green beans. Chard. Kale. Spinach. Onions. Leeks. Peas. Squash. Tomatoes. Peppers. Radishes. ? Fruits: Apples. Apricots. Avocado. Berries. Bananas. Cherries. Dates. Figs. Grapes. Lemons. Melon. Oranges. Peaches. Plums. Pomegranate. ? Meats and other protein foods: Beans. Almonds. Sunflower seeds. Pine nuts. Peanuts. [place]. Salmon. [place]all[place]. Shrimp. [place]. Tilapia. Clams. Oysters. Eggs. ? Dairy: Low-fat milk. Cheese. Greek yogurt. ? Beverages: Water. Red wine. Herbal tea. ? Fats and oils: Extra virgin olive oil. Avocado oil. Grape seed oil. ? Sweets and desserts: [place] yogurt with honey. Baked apples. Poached pears. Trail mix. ? Seasoning and other foods: Basil. Cilantro. Coriander. Cumin. Mint. Parsley. Sage. Rosemary. Tarragon. Garlic. Oregano. Thyme. Pepper. Balsalmic vinegar. Tahini. Hummus. Tomato sauce. Olives. Mushrooms. ? Limit these ? Grains: Prepackaged pasta or rice dishes. Prepackaged cereal with added sugar. ? Vegetables: Deep fried potatoes (french fries). ? Fruits: Fruit canned in  syrup. ? Meats and other protein foods: Beef. Pork. Lamb. Poultry with skin. Hot dogs. [place] Salines. ? Dairy: Ice cream. Sour cream. Whole milk. ? Beverages: Juice. Sugar-sweetened soft drinks. Beer. Liquor and spirits. ? Fats and oils: Butter. Canola oil. Vegetable oil. Beef fat (tallow). Lard. ? Sweets and desserts: Cookies. Cakes. Pies. Candy. ? Seasoning and other foods: Mayonnaise. Premade sauces and marinades. ? The items listed may not be a complete list. Talk with your dietitian about what dietary choices are right for you. Summary  The Mediterranean diet includes both food and lifestyle choices.  Eat a variety of fresh fruits and vegetables, beans, nuts, seeds, and whole grains.  Limit the amount of red meat and sweets that you eat.  Talk with your health care provider about whether it is safe for you to drink red wine in moderation. This means 1 glass a day for nonpregnant women and 2 glasses a day for men. A glass of wine equals 5 oz (150 mL). This information is not intended to replace advice given to you by your health care provider. Make sure you discuss any questions you have with your health care provider. Document Released: 04/09/2016 Document Revised: 05/12/2016 Document Reviewed: 04/09/2016  Chartered certified accountant Patient Education  [employer].

## 2018-02-23 NOTE — Assessment & Plan Note (Signed)
She would like to try lexapro- prescription sent-dosing and side effects discussed- she was instructed to start lexapro next week, after starting blood pressure medications this week, to avoid starting multiple medications at one time We also discussed long term risks associated with benzodiazepines including memory loss, falls, addiction and we will not resume xanax at this time  RTC in 1 month for F/U - escitalopram (LEXAPRO) 10 MG tablet; Take 1 tablet (10 mg total) by mouth daily.  Dispense: 30 tablet; Refill: 1

## 2018-02-23 NOTE — Assessment & Plan Note (Signed)
Update labs F/U with further recommendations pending lab results - CBC; Future - Comprehensive metabolic panel; Future - Microalbumin / creatinine urine ratio; Future - Ambulatory referral to Nutrition and Diabetic Education

## 2018-02-23 NOTE — Assessment & Plan Note (Signed)
Update labs F/U with further recommendations pending lab results She is not fasting- steamed vegetables for lunch - Lipid panel; Future - CBC; Future - Comprehensive metabolic panel; Future - Ambulatory referral to Nutrition and Diabetic Education
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
## 2018-02-23 NOTE — Assessment & Plan Note (Addendum)
BP is quite elevated today, will resume amlodipine, metoprolol succinate and start benicar HCT-dosing and side effects discussed  Update labs RTC in 2 weeks for follow up - CBC; Future - Comprehensive metabolic panel; Future - olmesartan-hydrochlorothiazide (BENICAR HCT) 40-25 MG tablet; Take 1 tablet by mouth daily.  Dispense: 30 tablet; Refill: 1 - metoprolol succinate (TOPROL-XL) 100 MG 24 hr tablet; Take 1 tablet (100 mg total) by mouth daily. Take with or immediately following a meal.  Dispense: 30 tablet; Refill: 1 - amLODipine (NORVASC) 10 MG tablet; Take 1 tablet (10 mg total) by mouth daily.  Dispense: 30 tablet; Refill: 1 - EKG 12-Lead: I have personally reviewed the EKG tracing and agree with computerized printout as noted: Sinus  Rhythm, no acute abnormalities comparison to prior EKG dated 06/24/15 - Ambulatory referral to Nutrition and Diabetic Education

## 2018-02-24 ENCOUNTER — Other Ambulatory Visit (INDEPENDENT_AMBULATORY_CARE_PROVIDER_SITE_OTHER): Payer: PRIVATE HEALTH INSURANCE

## 2018-02-24 DIAGNOSIS — E119 Type 2 diabetes mellitus without complications: Secondary | ICD-10-CM | POA: Diagnosis not present

## 2018-02-24 LAB — HEMOGLOBIN A1C: HEMOGLOBIN A1C: 14.9 % — AB (ref 4.6–6.5)

## 2018-02-25 LAB — CULTURE, URINE COMPREHENSIVE
MICRO NUMBER: 90763671
SPECIMEN QUALITY:: ADEQUATE

## 2018-02-28 ENCOUNTER — Other Ambulatory Visit: Payer: Self-pay | Admitting: Nurse Practitioner

## 2018-02-28 DIAGNOSIS — E876 Hypokalemia: Secondary | ICD-10-CM

## 2018-02-28 DIAGNOSIS — E119 Type 2 diabetes mellitus without complications: Secondary | ICD-10-CM

## 2018-02-28 MED ORDER — POTASSIUM CHLORIDE CRYS ER 20 MEQ PO TBCR: 20.0000 meq | EXTENDED_RELEASE_TABLET | Freq: Every day | ORAL | 1 refills | Status: DC

## 2018-02-28 MED ORDER — INSULIN DETEMIR 100 UNIT/ML ~~LOC~~ SOLN: 10.0000 [IU] | Freq: Every day | SUBCUTANEOUS | 2 refills | Status: DC

## 2018-03-01 ENCOUNTER — Other Ambulatory Visit: Payer: Self-pay

## 2018-03-01 DIAGNOSIS — E119 Type 2 diabetes mellitus without complications: Secondary | ICD-10-CM

## 2018-03-01 DIAGNOSIS — E876 Hypokalemia: Secondary | ICD-10-CM

## 2018-03-01 MED ORDER — ATORVASTATIN CALCIUM 40 MG PO TABS: 40.0000 mg | ORAL_TABLET | Freq: Every day | ORAL | 1 refills | Status: DC

## 2018-03-01 MED ORDER — POTASSIUM CHLORIDE CRYS ER 20 MEQ PO TBCR: 20.0000 meq | EXTENDED_RELEASE_TABLET | Freq: Every day | ORAL | 1 refills | Status: DC

## 2018-03-01 MED ORDER — BLOOD GLUCOSE MONITOR KIT: PACK | 0 refills | Status: DC

## 2018-03-01 MED ORDER — INSULIN DETEMIR 100 UNIT/ML ~~LOC~~ SOLN: 10.0000 [IU] | Freq: Every day | SUBCUTANEOUS | 2 refills | Status: DC

## 2018-03-10 ENCOUNTER — Ambulatory Visit: Payer: PRIVATE HEALTH INSURANCE | Admitting: Nurse Practitioner

## 2018-03-10 DIAGNOSIS — Z0289 Encounter for other administrative examinations: Secondary | ICD-10-CM

## 2018-04-06 ENCOUNTER — Ambulatory Visit: Payer: PRIVATE HEALTH INSURANCE | Admitting: Nurse Practitioner

## 2018-04-06 DIAGNOSIS — Z0289 Encounter for other administrative examinations: Secondary | ICD-10-CM

## 2018-04-19 ENCOUNTER — Ambulatory Visit: Payer: PRIVATE HEALTH INSURANCE | Admitting: Registered"

## 2018-04-22 ENCOUNTER — Ambulatory Visit: Payer: PRIVATE HEALTH INSURANCE | Admitting: Nurse Practitioner

## 2018-04-22 ENCOUNTER — Encounter: Payer: Self-pay | Admitting: Nurse Practitioner

## 2018-04-22 ENCOUNTER — Encounter (INDEPENDENT_AMBULATORY_CARE_PROVIDER_SITE_OTHER): Payer: Self-pay

## 2018-04-22 ENCOUNTER — Encounter: Payer: Self-pay | Admitting: Internal Medicine

## 2018-04-22 ENCOUNTER — Ambulatory Visit (INDEPENDENT_AMBULATORY_CARE_PROVIDER_SITE_OTHER): Payer: PRIVATE HEALTH INSURANCE | Admitting: Internal Medicine

## 2018-04-22 VITALS — BP 150/90 | HR 97 | Temp 98.2°F | Ht 66.0 in | Wt 219.0 lb

## 2018-04-22 DIAGNOSIS — F419 Anxiety disorder, unspecified: Secondary | ICD-10-CM

## 2018-04-22 DIAGNOSIS — E876 Hypokalemia: Secondary | ICD-10-CM

## 2018-04-22 DIAGNOSIS — E1121 Type 2 diabetes mellitus with diabetic nephropathy: Secondary | ICD-10-CM

## 2018-04-22 DIAGNOSIS — I10 Essential (primary) hypertension: Secondary | ICD-10-CM

## 2018-04-22 DIAGNOSIS — Z23 Encounter for immunization: Secondary | ICD-10-CM | POA: Diagnosis not present

## 2018-04-22 DIAGNOSIS — F329 Major depressive disorder, single episode, unspecified: Secondary | ICD-10-CM | POA: Diagnosis not present

## 2018-04-22 DIAGNOSIS — F32A Depression, unspecified: Secondary | ICD-10-CM

## 2018-04-22 DIAGNOSIS — Z124 Encounter for screening for malignant neoplasm of cervix: Secondary | ICD-10-CM | POA: Diagnosis not present

## 2018-04-22 DIAGNOSIS — Z1231 Encounter for screening mammogram for malignant neoplasm of breast: Secondary | ICD-10-CM

## 2018-04-22 DIAGNOSIS — Z1239 Encounter for other screening for malignant neoplasm of breast: Secondary | ICD-10-CM

## 2018-04-22 DIAGNOSIS — R799 Abnormal finding of blood chemistry, unspecified: Secondary | ICD-10-CM

## 2018-04-22 DIAGNOSIS — N289 Disorder of kidney and ureter, unspecified: Secondary | ICD-10-CM

## 2018-04-22 DIAGNOSIS — E785 Hyperlipidemia, unspecified: Secondary | ICD-10-CM

## 2018-04-22 LAB — POCT GLYCOSYLATED HEMOGLOBIN (HGB A1C): HEMOGLOBIN A1C: 12.2 % — AB (ref 4.0–5.6)

## 2018-04-22 MED ORDER — METFORMIN HCL ER 500 MG PO TB24
500.0000 mg | ORAL_TABLET | Freq: Every day | ORAL | 5 refills | Status: DC
Start: 2018-04-22 — End: 2018-08-18

## 2018-04-22 MED ORDER — AMLODIPINE-OLMESARTAN 10-40 MG PO TABS: 1.0000 | ORAL_TABLET | Freq: Every day | ORAL | 1 refills | Status: DC

## 2018-04-22 MED ORDER — SEMAGLUTIDE(0.25 OR 0.5MG/DOS) 2 MG/1.5ML ~~LOC~~ SOPN: 0.5000 mg | PEN_INJECTOR | SUBCUTANEOUS | 3 refills | Status: DC

## 2018-04-22 MED ORDER — ESCITALOPRAM OXALATE 20 MG PO TABS: 10.0000 mg | ORAL_TABLET | Freq: Every day | ORAL | 1 refills | Status: DC

## 2018-04-22 MED ORDER — TRIAMTERENE-HCTZ 37.5-25 MG PO TABS: 1.0000 | ORAL_TABLET | Freq: Every day | ORAL | 1 refills | Status: DC

## 2018-04-22 MED ORDER — INSULIN DETEMIR 100 UNIT/ML ~~LOC~~ SOLN: 25.0000 [IU] | Freq: Every day | SUBCUTANEOUS | 5 refills | Status: DC

## 2018-04-22 NOTE — Patient Instructions (Addendum)
Please increase: - Levemir to 25 units at bedtime  Please add: - Metformin ER 500 mg 2x a day with meals   Please start: - Ozempic 0.25 mg weekly in a.m. (for example on Sunday morning) x 4 weeks, then increase to 0.5 mg weekly in a.m. if no nausea or hypoglycemia.  Please return in 3 months with your sugar log.   PATIENT INSTRUCTIONS FOR TYPE 2 DIABETES:  **Please join MyChart!** - see attached instructions about how to join if you have not done so already.  DIET AND EXERCISE Diet and exercise is an important part of diabetic treatment.  We recommended aerobic exercise in the form of brisk walking (working between 40-60% of maximal aerobic capacity, similar to brisk walking) for 150 minutes per week (such as 30 minutes five days per week) along with 3 times per week performing 'resistance' training (using various gauge rubber tubes with handles) 5-10 exercises involving the major muscle groups (upper body, lower body and core) performing 10-15 repetitions (or near fatigue) each exercise. Start at half the above goal but build slowly to reach the above goals. If limited by weight, joint pain, or disability, we recommend daily walking in a swimming pool with water up to waist to reduce pressure from joints while allow for adequate exercise.    BLOOD GLUCOSES Monitoring your blood glucoses is important for continued management of your diabetes. Please check your blood glucoses 2-4 times a day: fasting, before meals and at bedtime (you can rotate these measurements - e.g. one day check before the 3 meals, the next day check before 2 of the meals and before bedtime, etc.).   HYPOGLYCEMIA (low blood sugar) Hypoglycemia is usually a reaction to not eating, exercising, or taking too much insulin/ other diabetes drugs.  Symptoms include tremors, sweating, hunger, confusion, headache, etc. Treat IMMEDIATELY with 15 grams of Carbs: . 4 glucose tablets .  cup regular juice/soda . 2 tablespoons  raisins . 4 teaspoons sugar . 1 tablespoon honey Recheck blood glucose in 15 mins and repeat above if still symptomatic/blood glucose <100.  RECOMMENDATIONS TO REDUCE YOUR RISK OF DIABETIC COMPLICATIONS: * Take your prescribed MEDICATION(S) * Follow a DIABETIC diet: Complex carbs, fiber rich foods, (monounsaturated and polyunsaturated) fats * AVOID saturated/trans fats, high fat foods, >2,300 mg salt per day. * EXERCISE at least 5 times a week for 30 minutes or preferably daily.  * DO NOT SMOKE OR DRINK more than 1 drink a day. * Check your FEET every day. Do not wear tightfitting shoes. Contact us if you develop an ulcer * See your EYE doctor once a year or more if needed * Get a FLU shot once a year * Get a PNEUMONIA vaccine once before and once after age 80 years  GOALS:  * Your Hemoglobin A1c of <7%  * fasting sugars need to be <130 * after meals sugars need to be <180 (2h after you start eating) * Your Systolic BP should be 503 or lower  * Your Diastolic BP should be 80 or lower  * Your HDL (Good Cholesterol) should be 40 or higher  * Your LDL (Bad Cholesterol) should be 100 or lower. * Your Triglycerides should be 150 or lower  * Your Urine microalbumin (kidney function) should be <30 * Your Body Mass Index should be 25 or lower    Please consider the following ways to cut down carbs and fat and increase fiber and micronutrients in your diet: - substitute whole grain for  white bread or pasta - substitute brown rice for white rice - substitute 90-calorie flat bread pieces for slices of bread when possible - substitute sweet potatoes or yams for white potatoes - substitute humus for margarine - substitute tofu for cheese when possible - substitute almond or rice milk for regular milk (would not drink soy milk daily due to concern for soy estrogen influence on breast cancer risk) - substitute dark chocolate for other sweets when possible - substitute water - can add lemon or  orange slices for taste - for diet sodas (artificial sweeteners will trick your body that you can eat sweets without getting calories and will lead you to overeating and weight gain in the long run) - do not skip breakfast or other meals (this will slow down the metabolism and will result in more weight gain over time)  - can try smoothies made from fruit and almond/rice milk in am instead of regular breakfast - can also try old-fashioned (not instant) oatmeal made with almond/rice milk in am - order the dressing on the side when eating salad at a restaurant (pour less than half of the dressing on the salad) - eat as little meat as possible - can try juicing, but should not forget that juicing will get rid of the fiber, so would alternate with eating raw veg./fruits or drinking smoothies - use as little oil as possible, even when using olive oil - can dress a salad with a mix of balsamic vinegar and lemon juice, for e.g. - use agave nectar, stevia sugar, or regular sugar rather than artificial sweateners - steam or broil/roast veggies  - snack on veggies/fruit/nuts (unsalted, preferably) when possible, rather than processed foods - reduce or eliminate aspartame in diet (it is in diet sodas, chewing gum, etc) Read the labels!  Try to read Dr. Janene Harvey book: "Program for Reversing Diabetes" for other ideas for healthy eating.

## 2018-04-22 NOTE — Progress Notes (Signed)
Name: Casey Acosta   MRN: [id number]    DOB: 09-14-1970   Date:04/27/2018       Progress Note  Subjective  Chief Complaint Follow up   HPI Casey Acosta is here today for follow up of hypertension and anxiety and depression. We will also follow up on abnormal lab work noted on labs drawn on 6/26 OV including abnormal cholesterol, potassium, white blood cell count, and renal function. Since our last OV she has established care with endocrinology for management of uncontrolled type 2 diabetes.  Hypertension -maintained on amlodipine 10, Metoprolol succinate 100 And benicar HCT 40-25 daily was added at her last OV on 6/26 for high BP readings. She was asked to return in 2 weeks for follow up but says she missed an appointment and had trouble re-scheduling. She reports she has made medication adjustments as instructed but her BP readings remain elevated when she checks occasionally. Denies headaches, vision changes, chest pain, shortness of breath, edema. She was also found to be slightly hypokalemic on her 6/26 lab draw, given 1 week course of kdur 20 daily which she says he completed, however did not return for repeat potassium labwork as instructed.  BP Readings from Last 3 Encounters:  04/22/18 (!) 150/90  04/22/18 (!) 152/88  02/23/18 (!) 184/102   Cholesterol- started on atorvastatin 40 daily due to abnormal lipid panel on 6/26. Reports she has been taking the atorvastatin daily as prescribed and tolerating well without noted adverse medication effects including nausea, myalgias.  Lab Results  Component Value Date   CHOL 327 (H) 02/23/2018   HDL 36.40 (L) 02/23/2018   LDLDIRECT 184.0 02/23/2018   TRIG (H) 02/23/2018    453.0 Triglyceride is over 400; calculations on Lipids are invalid.   CHOLHDL 9 02/23/2018    Anxiety and depression- started on lexapro 10 at last OV on 02/23/18 for uncontrolled anxiety and depression. She says she likes the lexapro, has been taking daily as  instructed and tolerating well without noted adverse medication effects, and has noted overall improvement in her mood, does not feel as depressed, easily irritated or upset but does continue to experience some anxiety on a daily basis.  She denies thoughts of hurting herself or others  Depression screen [facility] 2/9 04/22/2018  Decreased Interest 0  Down, Depressed, Hopeless 0  PHQ - 2 Score 0  Altered sleeping 0  Tired, decreased energy 1  Change in appetite 3  Feeling bad or failure about yourself  0  Trouble concentrating 2  Moving slowly or fidgety/restless 0  Suicidal thoughts 0  PHQ-9 Score 6  Difficult doing work/chores Not difficult at all    Patient Active Problem List   Diagnosis Date Noted  . Essential hypertension 02/23/2018  . Type 2 diabetes mellitus without complication, without long-term current use of insulin ([place]) 02/23/2018  . Hyperlipidemia 02/23/2018  . Anxiety and depression 02/23/2018    Past Surgical History:  Procedure Laterality Date  . ABDOMINAL HYSTERECTOMY     partial  . APPENDECTOMY    . CHOLECYSTECTOMY    . TUBAL LIGATION      Family History  Problem Relation Age of Onset  . Anxiety disorder Mother   . Hyperlipidemia Mother   . Depression Mother   . Hypertension Mother   . Diabetes Mother   . Hypertension Sister   . Diabetes Sister     Social History   Socioeconomic History  . Marital status: Single    Spouse name: Not on file  .  Number of children: Not on file  . Years of education: Not on file  . Highest education level: Not on file  Occupational History  . Not on file  Social Needs  . Financial resource strain: Not on file  . Food insecurity:    Worry: Not on file    Inability: Not on file  . Transportation needs:    Medical: Not on file    Non-medical: Not on file  Tobacco Use  . Smoking status: Never Smoker  . Smokeless tobacco: Never Used  Substance and Sexual Activity  . Alcohol use: No  . Drug use: No  . Sexual  activity: Not on file  Lifestyle  . Physical activity:    Days per week: Not on file    Minutes per session: Not on file  . Stress: Not on file  Relationships  . Social connections:    Talks on phone: Not on file    Gets together: Not on file    Attends religious service: Not on file    Active member of club or organization: Not on file    Attends meetings of clubs or organizations: Not on file    Relationship status: Not on file  . Intimate partner violence:    Fear of current or ex partner: Not on file    Emotionally abused: Not on file    Physically abused: Not on file    Forced sexual activity: Not on file  Other Topics Concern  . Not on file  Social History Narrative  . Not on file     Current Outpatient Medications:  .  atorvastatin (LIPITOR) 40 MG tablet, Take 1 tablet (40 mg total) by mouth daily., Disp: 30 tablet, Rfl: 1 .  blood glucose meter kit and supplies KIT, Dispense based on patient and insurance preference. Use up to four times daily as directed. Dx Code-E11.9, Disp: 1 each, Rfl: 0 .  CINNAMON PO, Take 1 capsule by mouth every evening., Disp: , Rfl:  .  escitalopram (LEXAPRO) 20 MG tablet, Take 0.5 tablets (10 mg total) by mouth daily., Disp: 30 tablet, Rfl: 1 .  insulin detemir (LEVEMIR) 100 UNIT/ML injection, Inject 0.25 mLs (25 Units total) into the skin at bedtime. Taper up as directed., Disp: 15 mL, Rfl: 5 .  Magnesium 250 MG TABS, Take 250 mg by mouth every evening., Disp: , Rfl:  .  metFORMIN (GLUCOPHAGE-XR) 500 MG 24 hr tablet, Take 1 tablet (500 mg total) by mouth daily with breakfast., Disp: 60 tablet, Rfl: 5 .  metoprolol succinate (TOPROL-XL) 100 MG 24 hr tablet, Take 1 tablet (100 mg total) by mouth daily. Take with or immediately following a meal., Disp: 30 tablet, Rfl: 1 .  Semaglutide (OZEMPIC) 0.25 or 0.5 MG/DOSE SOPN, Inject 0.5 mg into the skin once a week., Disp: 2 pen, Rfl: 3 .  amLODipine-olmesartan (AZOR) 10-40 MG tablet, Take 1 tablet by  mouth daily., Disp: 30 tablet, Rfl: 1 .  triamterene-hydrochlorothiazide (MAXZIDE-25) 37.5-25 MG tablet, Take 1 tablet by mouth daily., Disp: 30 tablet, Rfl: 1  No Known Allergies   ROS See HPI  Objective  Vitals:   04/22/18 1620  BP: (!) 150/90  Pulse: 97  Temp: 98.2 F (36.8 C)  TempSrc: Oral  SpO2: 99%  Weight: 219 lb (99.3 kg)  Height: '5\' 6"'$  (1.676 m)    Body mass index is 35.35 kg/m.  Physical Exam Vital signs reviewed. Constitutional: Patient appears well-developed and well-nourished. No distress.  HENT: Head:  Normocephalic and atraumatic. Nose: Nose normal. Mouth/Throat: Oropharynx is clear and moist. No oropharyngeal exudate.  Eyes: Conjunctivae and EOM are normal. Pupils are equal, round, and reactive to light. No scleral icterus.  Neck: Normal range of motion. Neck supple.  Cardiovascular: Normal rate, regular rhythm and normal heart sounds.   No BLE edema. Distal pulses intact. Pulmonary/Chest: Effort normal and breath sounds normal. No respiratory distress. Neurological: She is alert and oriented to person, place, and time. No cranial nerve deficit. Coordination, balance, strength, speech and gait are normal.  Skin: Skin is warm and dry. No rash noted. No erythema.  Psychiatric: Patient has a normal mood and affect. behavior is normal. Judgment and thought content normal.    Assessment & Plan RTC in 3 weeks for F/U: HTN-medication adjustments, recheck BMET; Anxiety and depression- increasing lexapro dosage  -Reviewed Health Maintenance:  Breast cancer screening- MM Digital Screening; Future Need for Tdap vaccination- Tdap vaccine greater than or equal to 7yo IM Cervical cancer screening- Ambulatory referral to Obstetrics / Gynecology- hx of hysterectomy but she is unsure if partial or full, we discussed referral to GYN for routine womens care and she is agreeable  Abnormal blood cell count Update labs F/U with further recommendations pending lab  results - CBC; Future  Hypokalemia Update labs F/U with further recommendations pending lab results - Comprehensive metabolic panel; Future  Decreased renal function Update labs F/U with further recommendations pending lab results - Comprehensive metabolic panel; Future

## 2018-04-22 NOTE — Patient Instructions (Addendum)
Please head downstairs for lab work today If any of your test results are critically abnormal, you will be contacted right away. Otherwise, I will contact you within a week about your test results and follow up recommendations  Please CONTINUE your metoprolol Please STOP your amlodipine and olmesartan-HCTZ. Please START amlodipine- olmesartan 10-40 once daily Please START triamterene- HCTZ 37.5-25 once daily.  Please INCREASE lexapro to 20 mg daily.  Please return in about 3 weeks for follow up.   DASH Eating Plan DASH stands for "Dietary Approaches to Stop Hypertension." The DASH eating plan is a healthy eating plan that has been shown to reduce high blood pressure (hypertension). It may also reduce your risk for type 2 diabetes, heart disease, and stroke. The DASH eating plan may also help with weight loss. What are tips for following this plan? General guidelines  Avoid eating more than 2,300 mg (milligrams) of salt (sodium) a day. If you have hypertension, you may need to reduce your sodium intake to 1,500 mg a day.  Limit alcohol intake to no more than 1 drink a day for nonpregnant women and 2 drinks a day for men. One drink equals 12 oz of beer, 5 oz of wine, or 1 oz of hard liquor.  Work with your health care provider to maintain a healthy body weight or to lose weight. Ask what an ideal weight is for you.  Get at least 30 minutes of exercise that causes your heart to beat faster (aerobic exercise) most days of the week. Activities may include walking, swimming, or biking.  Work with your health care provider or diet and nutrition specialist (dietitian) to adjust your eating plan to your individual calorie needs. Reading food labels  Check food labels for the amount of sodium per serving. Choose foods with less than 5 percent of the Daily Value of sodium. Generally, foods with less than 300 mg of sodium per serving fit into this eating plan.  To find whole grains, look for the  word "whole" as the first word in the ingredient list. Shopping  Buy products labeled as "low-sodium" or "no salt added."  Buy fresh foods. Avoid canned foods and premade or frozen meals. Cooking  Avoid adding salt when cooking. Use salt-free seasonings or herbs instead of table salt or sea salt. Check with your health care provider or pharmacist before using salt substitutes.  Do not fry foods. Cook foods using healthy methods such as baking, boiling, grilling, and broiling instead.  Cook with heart-healthy oils, such as olive, canola, soybean, or sunflower oil. Meal planning   Eat a balanced diet that includes: ? 5 or more servings of fruits and vegetables each day. At each meal, try to fill half of your plate with fruits and vegetables. ? Up to 6-8 servings of whole grains each day. ? Less than 6 oz of lean meat, poultry, or fish each day. A 3-oz serving of meat is about the same size as a deck of cards. One egg equals 1 oz. ? 2 servings of low-fat dairy each day. ? A serving of nuts, seeds, or beans 5 times each week. ? Heart-healthy fats. Healthy fats called Omega-3 fatty acids are found in foods such as flaxseeds and coldwater fish, like sardines, salmon, and mackerel.  Limit how much you eat of the following: ? Canned or prepackaged foods. ? Food that is high in trans fat, such as fried foods. ? Food that is high in saturated fat, such as fatty meat. ?  Sweets, desserts, sugary drinks, and other foods with added sugar. ? Full-fat dairy products.  Do not salt foods before eating.  Try to eat at least 2 vegetarian meals each week.  Eat more home-cooked food and less restaurant, buffet, and fast food.  When eating at a restaurant, ask that your food be prepared with less salt or no salt, if possible. What foods are recommended? The items listed may not be a complete list. Talk with your dietitian about what dietary choices are best for you. Grains Whole-grain or  whole-wheat bread. Whole-grain or whole-wheat pasta. Brown rice. [place]. Bulgur. Whole-grain and low-sodium cereals. Pita bread. Low-fat, low-sodium crackers. Whole-wheat flour tortillas. Vegetables Fresh or frozen vegetables (raw, steamed, roasted, or grilled). Low-sodium or reduced-sodium tomato and vegetable juice. Low-sodium or reduced-sodium tomato sauce and tomato paste. Low-sodium or reduced-sodium canned vegetables. Fruits All fresh, dried, or frozen fruit. Canned fruit in natural juice (without added sugar). Meat and other protein foods Skinless chicken or [place]. Ground chicken or [place]. Pork with fat trimmed off. Fish and seafood. Egg whites. Dried beans, peas, or lentils. Unsalted nuts, nut butters, and seeds. Unsalted canned beans. Lean cuts of beef with fat trimmed off. Low-sodium, lean deli meat. Dairy Low-fat (1%) or fat-free (skim) milk. Fat-free, low-fat, or reduced-fat cheeses. Nonfat, low-sodium ricotta or cottage cheese. Low-fat or nonfat yogurt. Low-fat, low-sodium cheese. Fats and oils Soft margarine without trans fats. Vegetable oil. Low-fat, reduced-fat, or light mayonnaise and salad dressings (reduced-sodium). Canola, safflower, olive, soybean, and sunflower oils. Avocado. Seasoning and other foods Herbs. Spices. Seasoning mixes without salt. Unsalted popcorn and pretzels. Fat-free sweets. What foods are not recommended? The items listed may not be a complete list. Talk with your dietitian about what dietary choices are best for you. Grains Baked goods made with fat, such as croissants, muffins, or some breads. Dry pasta or rice meal packs. Vegetables Creamed or fried vegetables. Vegetables in a cheese sauce. Regular canned vegetables (not low-sodium or reduced-sodium). Regular canned tomato sauce and paste (not low-sodium or reduced-sodium). Regular tomato and vegetable juice (not low-sodium or reduced-sodium). Angie Fava. Olives. Fruits Canned fruit in a light  or heavy syrup. Fried fruit. Fruit in cream or butter sauce. Meat and other protein foods Fatty cuts of meat. Ribs. Fried meat. Berniece Salines. Sausage. Bologna and other processed lunch meats. Salami. Fatback. Hotdogs. Bratwurst. Salted nuts and seeds. Canned beans with added salt. Canned or smoked fish. Whole eggs or egg yolks. Chicken or [place] with skin. Dairy Whole or 2% milk, cream, and half-and-half. Whole or full-fat cream cheese. Whole-fat or sweetened yogurt. Full-fat cheese. Nondairy creamers. Whipped toppings. Processed cheese and cheese spreads. Fats and oils Butter. Stick margarine. Lard. Shortening. Ghee. Bacon fat. Tropical oils, such as coconut, palm kernel, or palm oil. Seasoning and other foods Salted popcorn and pretzels. Onion salt, garlic salt, seasoned salt, table salt, and sea salt. Worcestershire sauce. Tartar sauce. Barbecue sauce. Teriyaki sauce. Soy sauce, including reduced-sodium. Steak sauce. Canned and packaged gravies. Fish sauce. Oyster sauce. Cocktail sauce. Horseradish that you find on the shelf. Ketchup. Mustard. Meat flavorings and tenderizers. Bouillon cubes. Hot sauce and Tabasco sauce. Premade or packaged marinades. Premade or packaged taco seasonings. Relishes. Regular salad dressings. Where to find more information:  National Heart, Lung, and [place]: https://wilson-eaton.com/  American Heart Association: www.heart.org Summary  The DASH eating plan is a healthy eating plan that has been shown to reduce high blood pressure (hypertension). It may also reduce your risk for type 2 diabetes,  heart disease, and stroke.  With the DASH eating plan, you should limit salt (sodium) intake to 2,300 mg a day. If you have hypertension, you may need to reduce your sodium intake to 1,500 mg a day.  When on the DASH eating plan, aim to eat more fresh fruits and vegetables, whole grains, lean proteins, low-fat dairy, and heart-healthy fats.  Work with your health care provider or  diet and nutrition specialist (dietitian) to adjust your eating plan to your individual calorie needs. This information is not intended to replace advice given to you by your health care provider. Make sure you discuss any questions you have with your health care provider. Document Released: 08/06/2011 Document Revised: 08/10/2016 Document Reviewed: 08/10/2016 Elsevier Interactive Patient Education  [employer].

## 2018-04-22 NOTE — Progress Notes (Signed)
Patient ID: Casey Acosta, female   DOB: 1971-02-07, 47 y.o.   MRN: [id number]   HPI: Casey Acosta is a 47 y.o.-year-old female, referred by her PCP, Dr. Glendon Axe, for management of DM2, dx in 2010, insulin-dependent, uncontrolled, with complications (CKD).  She has a 9-year history of diabetes, with recent worsening.  She saw her PCP 2 months ago and at that time her HbA1c was very high, at 14.9%.  At that time, she was started on the low-dose of basal insulin.  She increase this to 15 units of Levemir daily.  She does not feel that this is helping significantly.    She complains of nocturia, increased urination, blurry vision.  Her weight is fluctuating, with no consistent trend.  Last hemoglobin A1c was: Lab Results  Component Value Date   HGBA1C 14.9 (H) 02/24/2018   Pt is on a regimen of: - Levemir 10 >> 15 units at bedtime -started 02/24/2018 She has been Metformin >> tolerated this well, but stopped 2/2 CKD. Had diarrhea with the regular formulation  She tried [place] >> tolerated this well, but not covered by her insurance.  Pt checks her sugars 2x a day and they are: - am: 300s - 2h after b'fast: n/c - before lunch: n/c - 2h after lunch: n/c - before dinner: n/c - 2h after dinner: 300s - bedtime: n/c - nighttime: n/c No lows. Lowest sugar was 200. Highest sugar was 542.  Glucometer: ReliOn  Pt's meals are: - Breakfast: boiled egg + bacon + toast; bisquit ([employer]) - Lunch: salad, chicken salad + croissant - Dinner: may skip - Snacks: chips, crackers  - + CKD, last BUN/creatinine:  Lab Results  Component Value Date   BUN 22 02/23/2018   BUN 14 10/16/2017   CREATININE 1.47 (H) 02/23/2018   CREATININE 1.22 (H) 10/16/2017  On olmesartan 40.  - + HL; last set of lipids: Lab Results  Component Value Date   CHOL 327 (H) 02/23/2018   HDL 36.40 (L) 02/23/2018   LDLDIRECT 184.0 02/23/2018   TRIG (H) 02/23/2018    453.0 Triglyceride is over 400;  calculations on Lipids are invalid.   CHOLHDL 9 02/23/2018  On Lipitor 40.  - last eye exam was in 2019. No DR.   - no numbness and tingling in her feet.  Pt has FH of DM in mother and sister.  ROS: Constitutional: + Please see HPI, also, + fatigue, feeling both hot and cold, hot flashes, poor sleep Eyes: no blurry vision, no xerophthalmia ENT: no sore throat, no nodules palpated in throat, no dysphagia/odynophagia, no hoarseness Cardiovascular: no CP/SOB/+ palpitations/no leg swelling Respiratory: no cough/SOB Gastrointestinal: no N/V/D/C Musculoskeletal: no muscle/joint aches Skin: no rashes Neurological: no tremors/numbness/tingling/dizziness, + headache Psychiatric: no depression/anxiety  Past Medical History:  Diagnosis Date  . Anemia   . Anxiety   . Anxiety and depression   . Chronic kidney disease   . Colon polyp   . Diabetes mellitus without complication ([place])   . Dysuria   . High risk sexual behavior   . History of syncope   . Hyperlipidemia   . Hypertension   . Internal hemorrhoids   . LVH (left ventricular hypertrophy)   . Malaise and fatigue   . Obesity (BMI 30-39.9)   . Ovarian mass   . Panic disorder   . PCOS (polycystic ovarian syndrome)   . Rectal bleeding   . Shortness of breath   . Surgical menopause   . Thyromegaly   .  Vaginal trichomoniasis    Past Surgical History:  Procedure Laterality Date  . ABDOMINAL HYSTERECTOMY     partial  . APPENDECTOMY    . CHOLECYSTECTOMY    . TUBAL LIGATION     Social History   Socioeconomic History  . Marital status: Single    Spouse name: Not on file  . Number of children: 2  . Years of education: Not on file  . Highest education level: Not on file  Occupational History  . nurse  Social Needs  . Financial resource strain: Not on file  . Food insecurity:    Worry: Not on file    Inability: Not on file  . Transportation needs:    Medical: Not on file    Non-medical: Not on file  Tobacco Use  .  Smoking status: Never Smoker  . Smokeless tobacco: Never Used  Substance and Sexual Activity  . Alcohol use:  Occasional 1 glass of wine  . Drug use: No   Current Outpatient Medications on File Prior to Visit  Medication Sig Dispense Refill  . amLODipine (NORVASC) 10 MG tablet Take 1 tablet (10 mg total) by mouth daily. 30 tablet 1  . atorvastatin (LIPITOR) 40 MG tablet Take 1 tablet (40 mg total) by mouth daily. 30 tablet 1  . blood glucose meter kit and supplies KIT Dispense based on patient and insurance preference. Use up to four times daily as directed. Dx Code-E11.9 1 each 0  . CINNAMON PO Take 1 capsule by mouth every evening.    . escitalopram (LEXAPRO) 10 MG tablet Take 1 tablet (10 mg total) by mouth daily. 30 tablet 1  . Magnesium 250 MG TABS Take 250 mg by mouth every evening.    . metoprolol succinate (TOPROL-XL) 100 MG 24 hr tablet Take 1 tablet (100 mg total) by mouth daily. Take with or immediately following a meal. 30 tablet 1  . naproxen sodium (ALEVE) 220 MG tablet Take 440 mg by mouth 2 (two) times daily as needed (for pain.).    Marland Kitchen olmesartan-hydrochlorothiazide (BENICAR HCT) 40-25 MG tablet Take 1 tablet by mouth daily. 30 tablet 1   No current facility-administered medications on file prior to visit.    Also, Levemir, 15 units at bedtime.  No Known Allergies Family History  Problem Relation Age of Onset  . Anxiety disorder Mother   . Hyperlipidemia Mother   . Depression Mother   . Hypertension Mother   . Diabetes Mother   . Hypertension Sister   . Diabetes Sister    PE: BP (!) 152/88   Pulse 83   Ht '5\' 6"'$  (1.676 m)   Wt 216 lb 12.8 oz (98.3 kg)   SpO2 98%   BMI 34.99 kg/m  Wt Readings from Last 3 Encounters:  04/22/18 216 lb 12.8 oz (98.3 kg)  02/23/18 216 lb 12.8 oz (98.3 kg)  10/16/17 212 lb 9 oz (96.4 kg)   Constitutional: overweight, in NAD Eyes: PERRLA, EOMI, no exophthalmos ENT: moist mucous membranes, no thyromegaly, no cervical  lymphadenopathy Cardiovascular: RRR, No MRG Respiratory: CTA B Gastrointestinal: abdomen soft, NT, ND, BS+ Musculoskeletal: no deformities, strength intact in all 4 Skin: moist, warm, no rashes Neurological: no tremor with outstretched hands, DTR normal in all 4  ASSESSMENT: 1. DM2, insulin-dependent, uncontrolled, with long-term complications - CKD  PLAN:  1. Patient with long-standing, uncontrolled diabetes, on a low-dose of basal insulin started 2 months ago. HbA1c today: 12.2%, lower.   - We discussed that her  insulin dose is likely not high enough so I advised her to increase to 25 units at bedtime. - She was on metformin before which was stopped due to her kidney dysfunction, however, her latest GFR is 48, so we can start on the half maximal dose of metformin.  Since she had diarrhea with regular metformin before, I advised her to start metformin ER 500 mg twice a day. - Since I do not think that the above changes are enough, and also since she tolerated Solik well well, I advised her to start another GLP-1 receptor agonist, Ozempic.  She does not have a history of pancreatitis or family history of medullary thyroid cancer.  We will started a low dose and I explained that it may have a side effect of nausea, which usually improves after the first injection.  I advised her to increase the dose after 4 weeks. - I suggested to:  Patient Instructions  Please increase: - Levemir to 25 units at bedtime  Please add: - Metformin ER 500 mg 2x a day with meals   Please start: - Ozempic 0.25 mg weekly in a.m. (for example on Sunday morning) x 4 weeks, then increase to 0.5 mg weekly in a.m. if no nausea or hypoglycemia.  Please return in 3 months with your sugar log.   - Strongly advised her to start checking sugars at different times of the day - check 2x a day, rotating checks - given sugar log and advised how to fill it and to bring it at next appt  - given foot care handout and  explained the principles  - given instructions for hypoglycemia management "15-15 rule"  - advised for yearly eye exams  - Return to clinic in 3 mo with sugar log   Philemon Kingdom, MD PhD [facility]

## 2018-04-25 ENCOUNTER — Encounter: Payer: Self-pay | Admitting: Nurse Practitioner

## 2018-04-26 ENCOUNTER — Encounter: Payer: Self-pay | Admitting: Nurse Practitioner

## 2018-04-27 ENCOUNTER — Telehealth: Payer: Self-pay

## 2018-04-27 ENCOUNTER — Encounter: Payer: Self-pay | Admitting: Nurse Practitioner

## 2018-04-27 MED ORDER — DULAGLUTIDE 0.75 MG/0.5ML ~~LOC~~ SOAJ: 0.7500 mg | SUBCUTANEOUS | 0 refills | Status: DC

## 2018-04-27 NOTE — Telephone Encounter (Signed)
sent 

## 2018-04-27 NOTE — Telephone Encounter (Signed)
Yes, we can try Trulicity 8.97 mg weekly for 4 weeks and then increase to 1.5 mg weekly.

## 2018-04-27 NOTE — Telephone Encounter (Signed)
Called [employer] benefits and have to fax office notes to them for the PA

## 2018-04-27 NOTE — Telephone Encounter (Signed)
Let us try a preauthorization for Ozempic.

## 2018-04-27 NOTE — Telephone Encounter (Signed)
Ozempic is not covered on pts insurance, please advise if PA is needed or we can switch medications

## 2018-04-27 NOTE — Addendum Note (Signed)
Addended by: Drucilla Schmidt on: 04/27/2018 11:49 AM   Modules accepted: Orders

## 2018-04-27 NOTE — Telephone Encounter (Signed)
Devon from [employer] benefits called to let us know that PA for Ozemoic has been approved from 04/27/18 to 10/28/18- PA ref # is [id number]

## 2018-04-27 NOTE — Telephone Encounter (Signed)
Trulicity is not covered either, would you like a PA for Trulicity Ozempic or try a new medication?

## 2018-04-28 MED ORDER — SEMAGLUTIDE(0.25 OR 0.5MG/DOS) 2 MG/1.5ML ~~LOC~~ SOPN: 0.5000 mg | PEN_INJECTOR | SUBCUTANEOUS | 3 refills | Status: DC

## 2018-04-28 NOTE — Telephone Encounter (Signed)
Resent medication

## 2018-05-01 ENCOUNTER — Encounter: Payer: Self-pay | Admitting: Nurse Practitioner

## 2018-05-10 ENCOUNTER — Ambulatory Visit: Payer: PRIVATE HEALTH INSURANCE | Admitting: *Deleted

## 2018-05-16 NOTE — Assessment & Plan Note (Signed)
Continue atorvastatin Update labs - Lipid panel; Future - Comprehensive metabolic panel; Future

## 2018-05-16 NOTE — Assessment & Plan Note (Signed)
BP readings remain elevated on current medications, Will adjust medications as follows: continue metoprolol, STOP amlodipine and olmesartan-HCTZ,  START amlodipine- olmesartan 10-40 once daily, START triamterene- HCTZ 37.5-25 once daily to see if we can get better control of her blood pressure Discussed the role of healthy diet and exercise in the management of HTN and printed additional information on AVS RTC in 3 weeks for follow up - Comprehensive metabolic panel; Future - amLODipine-olmesartan (AZOR) 10-40 MG tablet; Take 1 tablet by mouth daily.  Dispense: 30 tablet; Refill: 1 - triamterene-hydrochlorothiazide (MAXZIDE-25) 37.5-25 MG tablet; Take 1 tablet by mouth daily.  Dispense: 30 tablet; Refill: 1

## 2018-05-16 NOTE — Assessment & Plan Note (Signed)
Reported improvement in mood with some continued symptoms and tolerating lexapro well, will increase dosage to 20 daily  RTC in 3 weeks for follow up - escitalopram (LEXAPRO) 20 MG tablet; Take 0.5 tablets (10 mg total) by mouth daily.  Dispense: 30 tablet; Refill: 1

## 2018-05-18 ENCOUNTER — Ambulatory Visit: Payer: PRIVATE HEALTH INSURANCE | Admitting: Nurse Practitioner

## 2018-05-18 ENCOUNTER — Other Ambulatory Visit: Payer: Self-pay | Admitting: Nurse Practitioner

## 2018-05-18 DIAGNOSIS — I10 Essential (primary) hypertension: Secondary | ICD-10-CM

## 2018-05-20 ENCOUNTER — Other Ambulatory Visit (INDEPENDENT_AMBULATORY_CARE_PROVIDER_SITE_OTHER): Payer: PRIVATE HEALTH INSURANCE

## 2018-05-20 ENCOUNTER — Encounter: Payer: Self-pay | Admitting: Nurse Practitioner

## 2018-05-20 ENCOUNTER — Ambulatory Visit: Payer: PRIVATE HEALTH INSURANCE | Admitting: Nurse Practitioner

## 2018-05-20 VITALS — BP 170/100 | HR 96 | Ht 66.0 in | Wt 211.0 lb

## 2018-05-20 DIAGNOSIS — I10 Essential (primary) hypertension: Secondary | ICD-10-CM

## 2018-05-20 DIAGNOSIS — F329 Major depressive disorder, single episode, unspecified: Secondary | ICD-10-CM | POA: Diagnosis not present

## 2018-05-20 DIAGNOSIS — N289 Disorder of kidney and ureter, unspecified: Secondary | ICD-10-CM | POA: Diagnosis not present

## 2018-05-20 DIAGNOSIS — E876 Hypokalemia: Secondary | ICD-10-CM

## 2018-05-20 DIAGNOSIS — Z23 Encounter for immunization: Secondary | ICD-10-CM

## 2018-05-20 DIAGNOSIS — F419 Anxiety disorder, unspecified: Secondary | ICD-10-CM | POA: Diagnosis not present

## 2018-05-20 DIAGNOSIS — R799 Abnormal finding of blood chemistry, unspecified: Secondary | ICD-10-CM | POA: Diagnosis not present

## 2018-05-20 DIAGNOSIS — E785 Hyperlipidemia, unspecified: Secondary | ICD-10-CM

## 2018-05-20 LAB — COMPREHENSIVE METABOLIC PANEL
ALT: 22 U/L (ref 0–35)
AST: 15 U/L (ref 0–37)
Albumin: 4.1 g/dL (ref 3.5–5.2)
Alkaline Phosphatase: 47 U/L (ref 39–117)
BUN: 18 mg/dL (ref 6–23)
CHLORIDE: 99 meq/L (ref 96–112)
CO2: 28 meq/L (ref 19–32)
Calcium: 10.1 mg/dL (ref 8.4–10.5)
Creatinine, Ser: 1.34 mg/dL — ABNORMAL HIGH (ref 0.40–1.20)
GFR: 54.09 mL/min — AB (ref >60.00)
GLUCOSE: 277 mg/dL — AB (ref 70–99)
POTASSIUM: 3.8 meq/L (ref 3.5–5.1)
Sodium: 136 mEq/L (ref 135–145)
Total Bilirubin: 0.6 mg/dL (ref 0.2–1.2)
Total Protein: 8.3 g/dL (ref 6.0–8.3)

## 2018-05-20 LAB — LIPID PANEL
CHOL/HDL RATIO: 6
Cholesterol: 222 mg/dL — ABNORMAL HIGH (ref 0–200)
HDL: 37.1 mg/dL — AB (ref >39.00)
NONHDL: 184.75
TRIGLYCERIDES: 381 mg/dL — AB (ref 0.0–149.0)
VLDL: 76.2 mg/dL — ABNORMAL HIGH (ref 0.0–40.0)

## 2018-05-20 LAB — CBC
HCT: 37.9 % (ref 36.0–46.0)
Hemoglobin: 12.6 g/dL (ref 12.0–15.0)
MCHC: 33.3 g/dL (ref 30.0–36.0)
MCV: 79.5 fl (ref 78.0–100.0)
PLATELETS: 311 10*3/uL (ref 150.0–400.0)
RBC: 4.76 Mil/uL (ref 3.87–5.11)
RDW: 14.9 % (ref 11.5–15.5)
WBC: 10.3 10*3/uL (ref 4.0–10.5)

## 2018-05-20 LAB — LDL CHOLESTEROL, DIRECT: Direct LDL: 120 mg/dL

## 2018-05-20 NOTE — Progress Notes (Signed)
Name: Casey Acosta   MRN: [id number]    DOB: 1971-06-07   Date:05/20/2018       Progress Note  Subjective  Chief Complaint Follow up  HPI Casey Acosta is here today for F/U of HTN, anxiety, which we discussed at her last OV 8/23. I also asked her to go for several follow up labs on 8/23 but she says she forgot, will go today, is fasting.  Hypertension -the following medication adjustments were made at her last OV on 8/23 due to continued elevated HTN: continue metoprolol, STOP amlodipine and olmesartan-HCTZ,  START amlodipine- olmesartan 10-40 once daily, START triamterene- HCTZ 37.5-25 once daily to see if we could get better control of her blood pressure. She tells me today she has made all changes as instructed, has felt some nausea in the am but otherwise tolerating the medications well.  Her BP is quite elevated today, she is tearful during visit, telling me "work is just so stressful right now, management changes, rude patients, I feel I may break." she works as Marine scientist in [employer]. she is requesting a few days off to rest and feels certain her BP will go down then. She does not want to make any medication changes today, would like to return for BP recheck after a few days of rest from work. Denies headaches, vision changes, shortness of breath, edema.  BP Readings from Last 3 Encounters:  05/20/18 (!) 170/100  04/22/18 (!) 150/90  04/22/18 (!) 152/88   Anxiety and depression-lexapro dosage was increased to 20 daily at last OV due to continued symptoms of anxiety and depression, but overall feeling better after starting lexapro. She has increased dosage as instructed, says she continues to notice overall improvement in her mood on lexapro but does say today that work is so stressful right now she does not think any medication would help, asking for a few days off work to rest and get away from stress. No SI, HI   Patient Active Problem List   Diagnosis Date Noted  . Essential hypertension  02/23/2018  . Type 2 diabetes mellitus without complication, without long-term current use of insulin ([place]) 02/23/2018  . Hyperlipidemia 02/23/2018  . Anxiety and depression 02/23/2018    Past Surgical History:  Procedure Laterality Date  . ABDOMINAL HYSTERECTOMY     partial  . APPENDECTOMY    . CHOLECYSTECTOMY    . TUBAL LIGATION      Family History  Problem Relation Age of Onset  . Anxiety disorder Mother   . Hyperlipidemia Mother   . Depression Mother   . Hypertension Mother   . Diabetes Mother   . Hypertension Sister   . Diabetes Sister     Social History   Socioeconomic History  . Marital status: Single    Spouse name: Not on file  . Number of children: Not on file  . Years of education: Not on file  . Highest education level: Not on file  Occupational History  . Not on file  Social Needs  . Financial resource strain: Not on file  . Food insecurity:    Worry: Not on file    Inability: Not on file  . Transportation needs:    Medical: Not on file    Non-medical: Not on file  Tobacco Use  . Smoking status: Never Smoker  . Smokeless tobacco: Never Used  Substance and Sexual Activity  . Alcohol use: No  . Drug use: No  . Sexual activity: Not on file  Lifestyle  . Physical activity:    Days per week: Not on file    Minutes per session: Not on file  . Stress: Not on file  Relationships  . Social connections:    Talks on phone: Not on file    Gets together: Not on file    Attends religious service: Not on file    Active member of club or organization: Not on file    Attends meetings of clubs or organizations: Not on file    Relationship status: Not on file  . Intimate partner violence:    Fear of current or ex partner: Not on file    Emotionally abused: Not on file    Physically abused: Not on file    Forced sexual activity: Not on file  Other Topics Concern  . Not on file  Social History Narrative  . Not on file     Current Outpatient  Medications:  .  amLODipine-olmesartan (AZOR) 10-40 MG tablet, Take 1 tablet by mouth daily., Disp: 30 tablet, Rfl: 1 .  atorvastatin (LIPITOR) 40 MG tablet, Take 1 tablet (40 mg total) by mouth daily., Disp: 30 tablet, Rfl: 1 .  blood glucose meter kit and supplies KIT, Dispense based on patient and insurance preference. Use up to four times daily as directed. Dx Code-E11.9, Disp: 1 each, Rfl: 0 .  CINNAMON PO, Take 1 capsule by mouth every evening., Disp: , Rfl:  .  escitalopram (LEXAPRO) 20 MG tablet, Take 0.5 tablets (10 mg total) by mouth daily., Disp: 30 tablet, Rfl: 1 .  insulin detemir (LEVEMIR) 100 UNIT/ML injection, Inject 0.25 mLs (25 Units total) into the skin at bedtime. Taper up as directed., Disp: 15 mL, Rfl: 5 .  Magnesium 250 MG TABS, Take 250 mg by mouth every evening., Disp: , Rfl:  .  metFORMIN (GLUCOPHAGE-XR) 500 MG 24 hr tablet, Take 1 tablet (500 mg total) by mouth daily with breakfast., Disp: 60 tablet, Rfl: 5 .  metoprolol succinate (TOPROL-XL) 100 MG 24 hr tablet, Take 1 tablet (100 mg total) by mouth daily. Take with or immediately following a meal., Disp: 30 tablet, Rfl: 1 .  Semaglutide (OZEMPIC) 0.25 or 0.5 MG/DOSE SOPN, Inject 0.5 mg into the skin once a week., Disp: 2 pen, Rfl: 3 .  triamterene-hydrochlorothiazide (MAXZIDE-25) 37.5-25 MG tablet, Take 1 tablet by mouth daily., Disp: 30 tablet, Rfl: 1  No Known Allergies   ROS See HPI  Objective  Vitals:   05/20/18 0925  BP: (!) 170/100  Pulse: 96  SpO2: 97%  Weight: 211 lb (95.7 kg)  Height: '5\' 6"'$  (1.676 m)    Body mass index is 34.06 kg/m.  Physical Exam Vital signs reviewed. Constitutional: Patient appears well-developed and well-nourished. No distress.  HENT: Head: Normocephalic and atraumatic. Nose: Nose normal. Mouth/Throat: Oropharynx is clear and moist. No oropharyngeal exudate.  Eyes: Conjunctivae and EOM are normal. Pupils are equal, round, and reactive to light. No scleral icterus.  Neck:  Normal range of motion. Neck supple.  Cardiovascular: Normal rate, regular rhythm and normal heart sounds.  Distal pulses intact. Pulmonary/Chest: Effort normal and breath sounds normal. No respiratory distress. Neurological: She is alert and oriented to person, place, and time. No cranial nerve deficit. Coordination, balance, strength, speech and gait are normal.  Skin: Skin is warm and dry. No rash noted. No erythema.  Psychiatric: Patient appears anxious, tearful. behavior is normal. Judgment and thought content normal.   Assessment & Plan RTC in 3 days for  F/U: HTN- recheck BP, Anxiety and depression  -Reviewed Health Maintenance: Need for influenza vaccination- Flu Vaccine QUAD 36+ mos IM

## 2018-05-20 NOTE — Patient Instructions (Signed)
Please head downstairs for lab work  Please return Monday for follow up so I can see how you are doing

## 2018-05-20 NOTE — Assessment & Plan Note (Signed)
BP is quite elevated today with reported medication compliance, she declines medication changes today I have provided her with 3 days off work at her request, asked her to return on Monday to recheck BP Will consider additional testing, medication changes if BP remains elevated in 3 days

## 2018-05-20 NOTE — Assessment & Plan Note (Signed)
Reports Anxiety and depression improved on new dosage of lexapro but is requesting work note today for respite due to severe stress at job Will continue lexapro at current dosage for now I have provided her with 3 days off work at her request, asked her to return on Monday for follow up

## 2018-05-23 ENCOUNTER — Ambulatory Visit: Payer: PRIVATE HEALTH INSURANCE | Admitting: Nurse Practitioner

## 2018-05-23 ENCOUNTER — Encounter: Payer: Self-pay | Admitting: Nurse Practitioner

## 2018-05-23 VITALS — BP 150/98 | HR 106 | Ht 66.0 in | Wt 210.0 lb

## 2018-05-23 DIAGNOSIS — R799 Abnormal finding of blood chemistry, unspecified: Secondary | ICD-10-CM | POA: Diagnosis not present

## 2018-05-23 DIAGNOSIS — E785 Hyperlipidemia, unspecified: Secondary | ICD-10-CM | POA: Diagnosis not present

## 2018-05-23 DIAGNOSIS — F329 Major depressive disorder, single episode, unspecified: Secondary | ICD-10-CM

## 2018-05-23 DIAGNOSIS — F32A Depression, unspecified: Secondary | ICD-10-CM

## 2018-05-23 DIAGNOSIS — E876 Hypokalemia: Secondary | ICD-10-CM

## 2018-05-23 DIAGNOSIS — F419 Anxiety disorder, unspecified: Secondary | ICD-10-CM | POA: Diagnosis not present

## 2018-05-23 DIAGNOSIS — N289 Disorder of kidney and ureter, unspecified: Secondary | ICD-10-CM

## 2018-05-23 DIAGNOSIS — I10 Essential (primary) hypertension: Secondary | ICD-10-CM | POA: Diagnosis not present

## 2018-05-23 MED ORDER — VENLAFAXINE HCL ER 37.5 MG PO CP24: 37.5000 mg | ORAL_CAPSULE | Freq: Every day | ORAL | 1 refills | Status: DC

## 2018-05-23 MED ORDER — VENLAFAXINE HCL ER 37.5 MG PO CP24: 37.5000 mg | ORAL_CAPSULE | Freq: Two times a day (BID) | ORAL | 1 refills | Status: DC

## 2018-05-23 NOTE — Patient Instructions (Addendum)
STOP lexapro START effexor 37.5 mg twice daily  Please start taking your metoprolol at night  Continue other medications as you have been taking  I will see you back in 1 month, or sooner if needed.

## 2018-05-23 NOTE — Progress Notes (Signed)
Name: Casey Acosta   MRN: [id number]    DOB: [birth year]   Date:05/23/2018       Progress Note  Subjective  Chief Complaint Follow up  HPI  Casey Acosta is here today for follow up of hypertension, anxiety and depression, she came in last Friday on 05/20/18 for follow up and her blood pressure was very elevated, she was very stressed about work and asked to have some time off from work for respite prior to any further medication adjustments. She returns today for follow up, says she stayed in bed all weekend, still not ready to go back to work and feeling very anxious about returning, asking for one more day off, She has continued to take the lexapro 20 daily as prescribed, but says she really is not sure its helping her. We discussed returning for lab follow up at her 9/20 OV, for HLD, hypokalemia, elevated white blood cell count, and abnormal renal function, will review these labs today.  Hypertension -maintained on metoprolol succinate 100, amlodipine- olmesartan 10-40 once daily, triamterene- HCTZ 37.5-25  Reports daily medication compliance without noted adverse medication effects. Reports she takes all three medications at the same time every day, notices her blood pressure seems higher at some times of day than others, continues to feel stressed, asking for one more day off work  BP Readings from Last 3 Encounters:  05/23/18 (!) 150/98  05/20/18 (!) 170/100  04/22/18 (!) 150/90    Patient Active Problem List   Diagnosis Date Noted  . Essential hypertension 02/23/2018  . Type 2 diabetes mellitus without complication, without long-term current use of insulin ([place]) 02/23/2018  . Hyperlipidemia 02/23/2018  . Anxiety and depression 02/23/2018    Past Surgical History:  Procedure Laterality Date  . ABDOMINAL HYSTERECTOMY     partial  . APPENDECTOMY    . CHOLECYSTECTOMY    . TUBAL LIGATION      Family History  Problem Relation Age of Onset  . Anxiety disorder Mother   .  Hyperlipidemia Mother   . Depression Mother   . Hypertension Mother   . Diabetes Mother   . Hypertension Sister   . Diabetes Sister     Social History   Socioeconomic History  . Marital status: Single    Spouse name: Not on file  . Number of children: Not on file  . Years of education: Not on file  . Highest education level: Not on file  Occupational History  . Not on file  Social Needs  . Financial resource strain: Not on file  . Food insecurity:    Worry: Not on file    Inability: Not on file  . Transportation needs:    Medical: Not on file    Non-medical: Not on file  Tobacco Use  . Smoking status: Never Smoker  . Smokeless tobacco: Never Used  Substance and Sexual Activity  . Alcohol use: No  . Drug use: No  . Sexual activity: Not on file  Lifestyle  . Physical activity:    Days per week: Not on file    Minutes per session: Not on file  . Stress: Not on file  Relationships  . Social connections:    Talks on phone: Not on file    Gets together: Not on file    Attends religious service: Not on file    Active member of club or organization: Not on file    Attends meetings of clubs or organizations: Not on file  Relationship status: Not on file  . Intimate partner violence:    Fear of current or ex partner: Not on file    Emotionally abused: Not on file    Physically abused: Not on file    Forced sexual activity: Not on file  Other Topics Concern  . Not on file  Social History Narrative  . Not on file     Current Outpatient Medications:  .  amLODipine-olmesartan (AZOR) 10-40 MG tablet, Take 1 tablet by mouth daily., Disp: 30 tablet, Rfl: 1 .  atorvastatin (LIPITOR) 40 MG tablet, Take 1 tablet (40 mg total) by mouth daily., Disp: 30 tablet, Rfl: 1 .  blood glucose meter kit and supplies KIT, Dispense based on patient and insurance preference. Use up to four times daily as directed. Dx Code-E11.9, Disp: 1 each, Rfl: 0 .  CINNAMON PO, Take 1 capsule by  mouth every evening., Disp: , Rfl:  .  escitalopram (LEXAPRO) 20 MG tablet, Take 0.5 tablets (10 mg total) by mouth daily., Disp: 30 tablet, Rfl: 1 .  insulin detemir (LEVEMIR) 100 UNIT/ML injection, Inject 0.25 mLs (25 Units total) into the skin at bedtime. Taper up as directed., Disp: 15 mL, Rfl: 5 .  Magnesium 250 MG TABS, Take 250 mg by mouth every evening., Disp: , Rfl:  .  metFORMIN (GLUCOPHAGE-XR) 500 MG 24 hr tablet, Take 1 tablet (500 mg total) by mouth daily with breakfast., Disp: 60 tablet, Rfl: 5 .  metoprolol succinate (TOPROL-XL) 100 MG 24 hr tablet, Take 1 tablet (100 mg total) by mouth daily. Take with or immediately following a meal., Disp: 30 tablet, Rfl: 1 .  Semaglutide (OZEMPIC) 0.25 or 0.5 MG/DOSE SOPN, Inject 0.5 mg into the skin once a week., Disp: 2 pen, Rfl: 3 .  triamterene-hydrochlorothiazide (MAXZIDE-25) 37.5-25 MG tablet, Take 1 tablet by mouth daily., Disp: 30 tablet, Rfl: 1  No Known Allergies   ROS See HPI  Objective  Vitals:   05/23/18 1556  BP: (!) 150/98  Pulse: (!) 106  SpO2: 96%  Weight: 210 lb (95.3 kg)  Height: '5\' 6"'$  (1.676 m)    Body mass index is 33.89 kg/m.  Physical Exam Vital signs reviewed. Constitutional: Patient appears well-developed and well-nourished. No distress.  HENT: Head: Normocephalic and atraumatic. Nose: Nose normal. Mouth/Throat: Oropharynx is clear and moist. No oropharyngeal exudate.  Eyes: Conjunctivae and EOM are normal. Pupils are equal, round, and reactive to light. No scleral icterus.  Neck: Normal range of motion. Neck supple.  Cardiovascular: Normal rate, regular rhythm and normal heart sounds.  Distal pulses intact. Pulmonary/Chest: Effort normal and breath sounds normal. No respiratory distress. Neurological:She is alert and oriented to person, place, and time. No cranial nerve deficit. Coordination, balance, strength, speech and gait are normal.  Skin: Skin is warm and dry. No rash noted. No erythema.   Psychiatric: Patient appears anxious, tearful. behavior is normal. Judgment and thought content normal.  Assessment & Plan RTC in 1 month for F/U: HTN- moving metoprolol to evening dosing; Anxiety and depression- stopping lexapro, starting effexor  Abnormal blood cell count 05/20/18 CBC WNL Continue to monitor   Hypokalemia Potassium normal on 05/20/18, off potassium supplements Continue to monitor

## 2018-05-26 ENCOUNTER — Encounter: Payer: Self-pay | Admitting: Nurse Practitioner

## 2018-05-26 DIAGNOSIS — N184 Chronic kidney disease, stage 4 (severe): Secondary | ICD-10-CM | POA: Insufficient documentation

## 2018-05-26 DIAGNOSIS — N183 Chronic kidney disease, stage 3 (moderate): Secondary | ICD-10-CM

## 2018-05-26 NOTE — Assessment & Plan Note (Addendum)
Uncontrolled We discussed options to treatment anxiety and depression including medication adjustment, referral to counseling or psychiatry, she would like to try a different medication today, effexor sent-dosing and side effects discussed D/c lexapro We also discussed that her job or other factors could be contributing to her stress which I have no control over, she says she must continue to work due to finances but thinking about other ways to change her stress at home at [facility], I have provided a note for 1 more day off work but she says she will return after that RTC in 1 month for F/U - venlafaxine XR (EFFEXOR XR) 37.5 MG 24 hr capsule; Take 1 capsule (37.5 mg total) by mouth 2 (two) times daily.  Dispense: 60 capsule; Refill: 1

## 2018-05-26 NOTE — Assessment & Plan Note (Signed)
BP has improved today Will adjust timing of medications but continue current dosages - moving metoprolol to PM dosing She still feels BP is related to stress, which we are addressing today as well RTC in 1 month for F/U

## 2018-05-26 NOTE — Assessment & Plan Note (Signed)
cholesterol remains elevated but is improving on atorvastatin, continue current dosage Will consider additional medication adjustments once we have acute problems stabilized

## 2018-05-26 NOTE — Assessment & Plan Note (Signed)
Stable.       - Continue to monitor

## 2018-06-17 ENCOUNTER — Ambulatory Visit: Payer: PRIVATE HEALTH INSURANCE | Admitting: Nurse Practitioner

## 2018-06-17 ENCOUNTER — Encounter: Payer: Self-pay | Admitting: Nurse Practitioner

## 2018-06-17 VITALS — BP 152/100 | HR 86 | Temp 98.7°F | Wt 209.8 lb

## 2018-06-17 DIAGNOSIS — F329 Major depressive disorder, single episode, unspecified: Secondary | ICD-10-CM

## 2018-06-17 DIAGNOSIS — I10 Essential (primary) hypertension: Secondary | ICD-10-CM

## 2018-06-17 DIAGNOSIS — F419 Anxiety disorder, unspecified: Secondary | ICD-10-CM | POA: Diagnosis not present

## 2018-06-17 DIAGNOSIS — F32A Depression, unspecified: Secondary | ICD-10-CM

## 2018-06-17 MED ORDER — CLONIDINE HCL 0.1 MG PO TABS: 0.1000 mg | ORAL_TABLET | Freq: Every day | ORAL | 1 refills | Status: DC

## 2018-06-17 NOTE — Assessment & Plan Note (Signed)
Continue effexor at current dosage RTC in 1 month for F/U- check response to effexor

## 2018-06-17 NOTE — Assessment & Plan Note (Signed)
Metoprolol refills do not appear consistent in our system over past few months, I did try to call her pharmacy to verify but unable to get through She tells me that for the past 1 month she is positive that she has taken all medications daily as prescribed, does think maybe prior to last month she may have taken a few of her medications inconsistently Due to her continued high blood pressure on multiple doses of medications and young age, I am going to place referral to nephrology for further evaluation We discussed the importance of daily medication compliance, reduction of salt, avoidance of nsaids, in the management of BP as well as the long term risks of uncontrolled BP including MI, CVA, kidney disease We also discussed adding clonidine at bedtime, she is hesitant to clonidine but says she will take it until nephrology evaluation I am going to have her RTC in 1 month to follow up while awaiting nephrology referral Additional education provided on AVS - Ambulatory referral to Nephrology - cloNIDine (CATAPRES) 0.1 MG tablet; Take 1 tablet (0.1 mg total) by mouth daily.  Dispense: 30 tablet; Refill: 1

## 2018-06-17 NOTE — Patient Instructions (Signed)
Continue current medications  Add clonidine 0.1mg  at bedtime  Follow up with me in 1 month, you will hear about referral to kidney doctor as well   DASH Eating Plan DASH stands for "Dietary Approaches to Stop Hypertension." The DASH eating plan is a healthy eating plan that has been shown to reduce high blood pressure (hypertension). It may also reduce your risk for type 2 diabetes, heart disease, and stroke. The DASH eating plan may also help with weight loss. What are tips for following this plan? General guidelines  Avoid eating more than 2,300 mg (milligrams) of salt (sodium) a day. If you have hypertension, you may need to reduce your sodium intake to 1,500 mg a day.  Limit alcohol intake to no more than 1 drink a day for nonpregnant women and 2 drinks a day for men. One drink equals 12 oz of beer, 5 oz of wine, or 1 oz of hard liquor.  Work with your health care provider to maintain a healthy body weight or to lose weight. Ask what an ideal weight is for you.  Get at least 30 minutes of exercise that causes your heart to beat faster (aerobic exercise) most days of the week. Activities may include walking, swimming, or biking.  Work with your health care provider or diet and nutrition specialist (dietitian) to adjust your eating plan to your individual calorie needs. Reading food labels  Check food labels for the amount of sodium per serving. Choose foods with less than 5 percent of the Daily Value of sodium. Generally, foods with less than 300 mg of sodium per serving fit into this eating plan.  To find whole grains, look for the word "whole" as the first word in the ingredient list. Shopping  Buy products labeled as "low-sodium" or "no salt added."  Buy fresh foods. Avoid canned foods and premade or frozen meals. Cooking  Avoid adding salt when cooking. Use salt-free seasonings or herbs instead of table salt or sea salt. Check with your health care provider or pharmacist  before using salt substitutes.  Do not fry foods. Cook foods using healthy methods such as baking, boiling, grilling, and broiling instead.  Cook with heart-healthy oils, such as olive, canola, soybean, or sunflower oil. Meal planning   Eat a balanced diet that includes: ? 5 or more servings of fruits and vegetables each day. At each meal, try to fill half of your plate with fruits and vegetables. ? Up to 6-8 servings of whole grains each day. ? Less than 6 oz of lean meat, poultry, or fish each day. A 3-oz serving of meat is about the same size as a deck of cards. One egg equals 1 oz. ? 2 servings of low-fat dairy each day. ? A serving of nuts, seeds, or beans 5 times each week. ? Heart-healthy fats. Healthy fats called Omega-3 fatty acids are found in foods such as flaxseeds and coldwater fish, like sardines, salmon, and mackerel.  Limit how much you eat of the following: ? Canned or prepackaged foods. ? Food that is high in trans fat, such as fried foods. ? Food that is high in saturated fat, such as fatty meat. ? Sweets, desserts, sugary drinks, and other foods with added sugar. ? Full-fat dairy products.  Do not salt foods before eating.  Try to eat at least 2 vegetarian meals each week.  Eat more home-cooked food and less restaurant, buffet, and fast food.  When eating at a restaurant, ask that your food  be prepared with less salt or no salt, if possible. What foods are recommended? The items listed may not be a complete list. Talk with your dietitian about what dietary choices are best for you. Grains Whole-grain or whole-wheat bread. Whole-grain or whole-wheat pasta. Brown rice. [place]. Bulgur. Whole-grain and low-sodium cereals. Pita bread. Low-fat, low-sodium crackers. Whole-wheat flour tortillas. Vegetables Fresh or frozen vegetables (raw, steamed, roasted, or grilled). Low-sodium or reduced-sodium tomato and vegetable juice. Low-sodium or reduced-sodium tomato  sauce and tomato paste. Low-sodium or reduced-sodium canned vegetables. Fruits All fresh, dried, or frozen fruit. Canned fruit in natural juice (without added sugar). Meat and other protein foods Skinless chicken or [place]. Ground chicken or [place]. Pork with fat trimmed off. Fish and seafood. Egg whites. Dried beans, peas, or lentils. Unsalted nuts, nut butters, and seeds. Unsalted canned beans. Lean cuts of beef with fat trimmed off. Low-sodium, lean deli meat. Dairy Low-fat (1%) or fat-free (skim) milk. Fat-free, low-fat, or reduced-fat cheeses. Nonfat, low-sodium ricotta or cottage cheese. Low-fat or nonfat yogurt. Low-fat, low-sodium cheese. Fats and oils Soft margarine without trans fats. Vegetable oil. Low-fat, reduced-fat, or light mayonnaise and salad dressings (reduced-sodium). Canola, safflower, olive, soybean, and sunflower oils. Avocado. Seasoning and other foods Herbs. Spices. Seasoning mixes without salt. Unsalted popcorn and pretzels. Fat-free sweets. What foods are not recommended? The items listed may not be a complete list. Talk with your dietitian about what dietary choices are best for you. Grains Baked goods made with fat, such as croissants, muffins, or some breads. Dry pasta or rice meal packs. Vegetables Creamed or fried vegetables. Vegetables in a cheese sauce. Regular canned vegetables (not low-sodium or reduced-sodium). Regular canned tomato sauce and paste (not low-sodium or reduced-sodium). Regular tomato and vegetable juice (not low-sodium or reduced-sodium). Angie Fava. Olives. Fruits Canned fruit in a light or heavy syrup. Fried fruit. Fruit in cream or butter sauce. Meat and other protein foods Fatty cuts of meat. Ribs. Fried meat. Berniece Salines. Sausage. Bologna and other processed lunch meats. Salami. Fatback. Hotdogs. Bratwurst. Salted nuts and seeds. Canned beans with added salt. Canned or smoked fish. Whole eggs or egg yolks. Chicken or [place] with skin. Dairy Whole  or 2% milk, cream, and half-and-half. Whole or full-fat cream cheese. Whole-fat or sweetened yogurt. Full-fat cheese. Nondairy creamers. Whipped toppings. Processed cheese and cheese spreads. Fats and oils Butter. Stick margarine. Lard. Shortening. Ghee. Bacon fat. Tropical oils, such as coconut, palm kernel, or palm oil. Seasoning and other foods Salted popcorn and pretzels. Onion salt, garlic salt, seasoned salt, table salt, and sea salt. Worcestershire sauce. Tartar sauce. Barbecue sauce. Teriyaki sauce. Soy sauce, including reduced-sodium. Steak sauce. Canned and packaged gravies. Fish sauce. Oyster sauce. Cocktail sauce. Horseradish that you find on the shelf. Ketchup. Mustard. Meat flavorings and tenderizers. Bouillon cubes. Hot sauce and Tabasco sauce. Premade or packaged marinades. Premade or packaged taco seasonings. Relishes. Regular salad dressings. Where to find more information:  National Heart, Lung, and [place]: https://wilson-eaton.com/  American Heart Association: www.heart.org Summary  The DASH eating plan is a healthy eating plan that has been shown to reduce high blood pressure (hypertension). It may also reduce your risk for type 2 diabetes, heart disease, and stroke.  With the DASH eating plan, you should limit salt (sodium) intake to 2,300 mg a day. If you have hypertension, you may need to reduce your sodium intake to 1,500 mg a day.  When on the DASH eating plan, aim to eat more fresh fruits and vegetables, whole  grains, lean proteins, low-fat dairy, and heart-healthy fats.  Work with your health care provider or diet and nutrition specialist (dietitian) to adjust your eating plan to your individual calorie needs. This information is not intended to replace advice given to you by your health care provider. Make sure you discuss any questions you have with your health care provider. Document Released: 08/06/2011 Document Revised: 08/10/2016 Document Reviewed:  08/10/2016 Elsevier Interactive Patient Education  [employer].

## 2018-06-17 NOTE — Progress Notes (Signed)
Casey Acosta is a 47 y.o. female with the following history as recorded in EpicCare:  Patient Active Problem List   Diagnosis Date Noted  . Decreased renal function 05/26/2018  . Essential hypertension 02/23/2018  . Type 2 diabetes mellitus without complication, without long-term current use of insulin ([place]) 02/23/2018  . Hyperlipidemia 02/23/2018  . Anxiety and depression 02/23/2018    Current Outpatient Medications  Medication Sig Dispense Refill  . amLODipine-olmesartan (AZOR) 10-40 MG tablet Take 1 tablet by mouth daily. 30 tablet 1  . atorvastatin (LIPITOR) 40 MG tablet Take 1 tablet (40 mg total) by mouth daily. 30 tablet 1  . blood glucose meter kit and supplies KIT Dispense based on patient and insurance preference. Use up to four times daily as directed. Dx Code-E11.9 1 each 0  . CINNAMON PO Take 1 capsule by mouth every evening.    . insulin detemir (LEVEMIR) 100 UNIT/ML injection Inject 0.25 mLs (25 Units total) into the skin at bedtime. Taper up as directed. 15 mL 5  . Magnesium 250 MG TABS Take 250 mg by mouth every evening.    . metFORMIN (GLUCOPHAGE-XR) 500 MG 24 hr tablet Take 1 tablet (500 mg total) by mouth daily with breakfast. 60 tablet 5  . metoprolol succinate (TOPROL-XL) 100 MG 24 hr tablet Take 1 tablet (100 mg total) by mouth daily. Take with or immediately following a meal. 30 tablet 1  . Semaglutide (OZEMPIC) 0.25 or 0.5 MG/DOSE SOPN Inject 0.5 mg into the skin once a week. 2 pen 3  . triamterene-hydrochlorothiazide (MAXZIDE-25) 37.5-25 MG tablet Take 1 tablet by mouth daily. 30 tablet 1  . venlafaxine XR (EFFEXOR XR) 37.5 MG 24 hr capsule Take 1 capsule (37.5 mg total) by mouth 2 (two) times daily. 60 capsule 1  . cloNIDine (CATAPRES) 0.1 MG tablet Take 1 tablet (0.1 mg total) by mouth daily. 30 tablet 1   No current facility-administered medications for this visit.     Allergies: Patient has no known allergies.  Past Medical History:  Diagnosis Date  .  Anemia   . Anxiety   . Anxiety and depression   . Chronic kidney disease   . Colon polyp   . Diabetes mellitus without complication ([place])   . Dysuria   . High risk sexual behavior   . History of syncope   . Hyperlipidemia   . Hypertension   . Internal hemorrhoids   . LVH (left ventricular hypertrophy)   . Malaise and fatigue   . Obesity (BMI 30-39.9)   . Ovarian mass   . Panic disorder   . PCOS (polycystic ovarian syndrome)   . Rectal bleeding   . Shortness of breath   . Surgical menopause   . Thyromegaly   . Vaginal trichomoniasis     Past Surgical History:  Procedure Laterality Date  . ABDOMINAL HYSTERECTOMY     partial  . APPENDECTOMY    . CHOLECYSTECTOMY    . TUBAL LIGATION      Family History  Problem Relation Age of Onset  . Anxiety disorder Mother   . Hyperlipidemia Mother   . Depression Mother   . Hypertension Mother   . Diabetes Mother   . Hypertension Sister   . Diabetes Sister     Social History   Tobacco Use  . Smoking status: Never Smoker  . Smokeless tobacco: Never Used  Substance Use Topics  . Alcohol use: No     Subjective:  Casey Acosta is here today for  follow up of HTN, anxiety and depression. I last saw her on 05/23/18, she was very stressed and anxious especially about work, she felt this was contributing to her blood pressure, we decided to change her from lexapro to effexor for her anxiety. Her blood pressure was actually a little better than on her prior visit, we decided to try moving her metoprolol dosing to pm to see if this improved her BP readings, and the remainder of her BP medications were continued Today, she tells me she actually only started effexor about 1 week ago, so far has not noted any adverse effects, but has noticed feeling a little less worried and stressed, also things at work have improved some which has helped. She has moved metoprolol dosing to bedtime, continued amlodipine- olmesartan 10-40 once daily, triamterene-  HCTZ 37.5-25 once daily in am as instructed, reports taking all medications daily with no missed doses however was "late" taking her medications today due to busy day at work and was rushing to get her job done to get here in time for appointment, feels that is probably why her BP is up today. She denies weakness, syncope, cp, sob, palpitations, edema. She has noticed some intermittent headaches. She does report eating lots of soup recently, high in sodium, sometimes takes ibuprofen for her headaches but not often.  BP Readings from Last 3 Encounters:  06/17/18 (!) 152/100  05/23/18 (!) 150/98  05/20/18 (!) 170/100    ROS-See HPI  Objective:  Vitals:   06/17/18 1608  BP: (!) 152/100  Pulse: 86  Temp: 98.7 F (37.1 C)  TempSrc: Oral  Weight: 209 lb 12.8 oz (95.2 kg)    General: Well developed, well nourished, in no acute distress  Skin : Warm and dry.  Head: Normocephalic and atraumatic  Eyes: Sclera and conjunctiva clear; pupils round and reactive to light; extraocular movements intact  Oropharynx: Pink, supple. Neck: Supple  Lungs: Respirations unlabored; clear to auscultation bilaterally CVS exam: normal rate, regular rhythm, normal S1, S2.  Extremities: No edema, cyanosis Vessels: Symmetric bilaterally  Neurologic: Alert and oriented; speech intact; face symmetrical; moves all extremities well; CNII-XII intact without focal deficit    Assessment:  1. Essential hypertension   2. Anxiety and depression     Plan:   Return in about 1 month (around 07/18/2018) for F/U: anxiety-continue effexor, HTN-adding clonidine, neprhology referral .  Orders Placed This Encounter  Procedures  . Ambulatory referral to Nephrology    Referral Priority:   Routine    Referral Type:   Consultation    Referral Reason:   Specialty Services Required    Requested Specialty:   Nephrology    Number of Visits Requested:   1    Requested Prescriptions   Signed Prescriptions Disp Refills  .  cloNIDine (CATAPRES) 0.1 MG tablet 30 tablet 1    Sig: Take 1 tablet (0.1 mg total) by mouth daily.

## 2018-06-21 ENCOUNTER — Ambulatory Visit: Payer: PRIVATE HEALTH INSURANCE | Admitting: Women's Health

## 2018-06-26 ENCOUNTER — Other Ambulatory Visit: Payer: Self-pay | Admitting: Nurse Practitioner

## 2018-06-26 DIAGNOSIS — I10 Essential (primary) hypertension: Secondary | ICD-10-CM

## 2018-06-29 ENCOUNTER — Ambulatory Visit: Payer: PRIVATE HEALTH INSURANCE | Admitting: Women's Health

## 2018-07-11 ENCOUNTER — Ambulatory Visit: Payer: PRIVATE HEALTH INSURANCE | Admitting: Podiatry

## 2018-07-13 ENCOUNTER — Encounter: Payer: Self-pay | Admitting: Nurse Practitioner

## 2018-07-13 ENCOUNTER — Ambulatory Visit: Payer: PRIVATE HEALTH INSURANCE | Admitting: Nurse Practitioner

## 2018-07-13 VITALS — BP 160/90 | HR 99 | Temp 98.4°F | Ht 66.0 in | Wt 208.0 lb

## 2018-07-13 DIAGNOSIS — I10 Essential (primary) hypertension: Secondary | ICD-10-CM

## 2018-07-13 DIAGNOSIS — J069 Acute upper respiratory infection, unspecified: Secondary | ICD-10-CM

## 2018-07-13 MED ORDER — HYDROCODONE-HOMATROPINE 5-1.5 MG/5ML PO SYRP: 5.0000 mL | ORAL_SOLUTION | Freq: Three times a day (TID) | ORAL | 0 refills | Status: DC | PRN

## 2018-07-13 NOTE — Progress Notes (Signed)
Casey Acosta is a 47 y.o. female with the following history as recorded in EpicCare:  Patient Active Problem List   Diagnosis Date Noted  . Decreased renal function 05/26/2018  . Essential hypertension 02/23/2018  . Type 2 diabetes mellitus without complication, without long-term current use of insulin (Casey Acosta) 02/23/2018  . Hyperlipidemia 02/23/2018  . Anxiety and depression 02/23/2018    Current Outpatient Medications  Medication Sig Dispense Refill  . atorvastatin (LIPITOR) 40 MG tablet TAKE 1 TABLET BY MOUTH ONCE DAILY 90 tablet 1  . blood glucose meter kit and supplies KIT Dispense based on patient and insurance preference. Use up to four times daily as directed. Dx Code-E11.9 1 each 0  . cloNIDine (CATAPRES) 0.1 MG tablet Take 1 tablet (0.1 mg total) by mouth daily. 30 tablet 1  . insulin detemir (LEVEMIR) 100 UNIT/ML injection Inject 0.25 mLs (25 Units total) into the skin at bedtime. Taper up as directed. 15 mL 5  . metFORMIN (GLUCOPHAGE-XR) 500 MG 24 hr tablet Take 1 tablet (500 mg total) by mouth daily with breakfast. 60 tablet 5  . metoprolol succinate (TOPROL-XL) 100 MG 24 hr tablet TAKE 1 TABLET BY MOUTH ONCE DAILY. TAKE WITH OR IMMEDIATELY FOLLOWING A MEAL. 90 tablet 1  . Semaglutide (OZEMPIC) 0.25 or 0.5 MG/DOSE SOPN Inject 0.5 mg into the skin once a week. 2 pen 3  . triamterene-hydrochlorothiazide (MAXZIDE-25) 37.5-25 MG tablet Take 1 tablet by mouth daily. 30 tablet 1  . venlafaxine XR (EFFEXOR XR) 37.5 MG 24 hr capsule Take 1 capsule (37.5 mg total) by mouth 2 (two) times daily. 60 capsule 1  . amLODipine-olmesartan (AZOR) 10-40 MG tablet Take 1 tablet by mouth daily. (Patient not taking: Reported on 07/13/2018) 30 tablet 1  . HYDROcodone-homatropine (HYCODAN) 5-1.5 MG/5ML syrup Take 5 mLs by mouth every 8 (eight) hours as needed for cough. 120 mL 0   No current facility-administered medications for this visit.     Allergies: Patient has no known allergies.  Past  Medical History:  Diagnosis Date  . Anemia   . Anxiety   . Anxiety and depression   . Chronic kidney disease   . Colon polyp   . Diabetes mellitus without complication (Casey Acosta)   . Dysuria   . High risk sexual behavior   . History of syncope   . Hyperlipidemia   . Hypertension   . Internal hemorrhoids   . LVH (left ventricular hypertrophy)   . Malaise and fatigue   . Obesity (BMI 30-39.9)   . Ovarian mass   . Panic disorder   . PCOS (polycystic ovarian syndrome)   . Rectal bleeding   . Shortness of breath   . Surgical menopause   . Thyromegaly   . Vaginal trichomoniasis     Past Surgical History:  Procedure Laterality Date  . ABDOMINAL HYSTERECTOMY     partial  . APPENDECTOMY    . CHOLECYSTECTOMY    . TUBAL LIGATION      Family History  Problem Relation Age of Onset  . Anxiety disorder Mother   . Hyperlipidemia Mother   . Depression Mother   . Hypertension Mother   . Diabetes Mother   . Hypertension Sister   . Diabetes Sister     Social History   Tobacco Use  . Smoking status: Never Smoker  . Smokeless tobacco: Never Used  Substance Use Topics  . Alcohol use: No     Subjective:  Casey Acosta is here today requesting evaluation of acute  complaint of cough/cold symptoms, which first began about 4 days ago, this past Saturday, initially with body aches, dry cough, then over past 4 days developed malaise, watery eyes, nasal congestion, sore throat, some shortness of breath while coughing. States she cant sleep due to cough. Denies: fevers, chills, abdominal pain, nausea, vomiting. Smoker? No  Tried at home: otc alka seltzer cold and flu, theraflu, roibtussin dm, hot tea, lemon Works in [employer], many sick coworkers and patients recently with similar symptoms   ROS- SEE HPI  Objective:  Vitals:   07/13/18 1400  BP: (!) 160/90  Pulse: 99  Temp: 98.4 F (36.9 C)  TempSrc: Oral  SpO2: 98%  Weight: 208 lb (94.3 kg)  Height: '5\' 6"'$  (1.676 m)    General: Well  developed, well nourished, in no acute distress  Skin : Warm and dry.  Head: Normocephalic and atraumatic  Eyes: Sclera and conjunctiva clear; pupils round and reactive to light; extraocular movements intact  Ears: External normal; canals clear; tympanic membranes normal  Nose: no maxillary or frontal sinus tenderness Oropharynx: Pink, supple. No suspicious lesions  Neck: Supple without thyromegaly, adenopathy  Lungs: Respirations unlabored; clear to auscultation bilaterally  CVS exam: normal rate and regular rhythm.  Extremities: No edema, cyanosis Vessels: Symmetric bilaterally  Neurologic: Alert and oriented; speech intact; face symmetrical; moves all extremities well; CNII-XII intact without focal deficit  Psychiatric: Normal mood and affect.  Assessment:  1. Upper respiratory tract infection, unspecified type   2. Essential hypertension     Plan:   Due to short duration of symptoms, will treat symptoms  with OTC mediations, hycodan Rx for cough/sleep- dosing, side effects discussed  Home management, red flags and return precautions including when to seek immediate care discussed and printed on AVS She was instructed to follow up if symptoms continue for >1 week  No follow-ups on file.  No orders of the defined types were placed in this encounter.   Requested Prescriptions   Signed Prescriptions Disp Refills  . HYDROcodone-homatropine (HYCODAN) 5-1.5 MG/5ML syrup 120 mL 0    Sig: Take 5 mLs by mouth every 8 (eight) hours as needed for cough.

## 2018-07-13 NOTE — Patient Instructions (Signed)
Please start flonase and mucinex as discussed  Hycodan for cough/ to help you sleep at night- this can cause drowsiness. Please do not drink alcohol or operate machinery when you take this medication.  Please follow up for fevers over 101, if your symptoms get worse, or if your symptoms dont get better with the antibiotic.   Upper Respiratory Infection, Adult Most upper respiratory infections (URIs) are caused by a virus. A URI affects the nose, throat, and upper air passages. The most common type of URI is often called "the common cold." Follow these instructions at home:  Take medicines only as told by your doctor.  Gargle warm saltwater or take cough drops to comfort your throat as told by your doctor.  Use a warm mist humidifier or inhale steam from a shower to increase air moisture. This may make it easier to breathe.  Drink enough fluid to keep your pee (urine) clear or pale yellow.  Eat soups and other clear broths.  Have a healthy diet.  Rest as needed.  Go back to work when your fever is gone or your doctor says it is okay. ? You may need to stay home longer to avoid giving your URI to others. ? You can also wear a face mask and wash your hands often to prevent spread of the virus.  Use your inhaler more if you have asthma.  Do not use any tobacco products, including cigarettes, chewing tobacco, or electronic cigarettes. If you need help quitting, ask your doctor. Contact a doctor if:  You are getting worse, not better.  Your symptoms are not helped by medicine.  You have chills.  You are getting more short of breath.  You have brown or red mucus.  You have yellow or brown discharge from your nose.  You have pain in your face, especially when you bend forward.  You have a fever.  You have puffy (swollen) neck glands.  You have pain while swallowing.  You have white areas in the back of your throat. Get help right away if:  You have very bad or  constant: ? Headache. ? Ear pain. ? Pain in your forehead, behind your eyes, and over your cheekbones (sinus pain). ? Chest pain.  You have long-lasting (chronic) lung disease and any of the following: ? Wheezing. ? Long-lasting cough. ? Coughing up blood. ? A change in your usual mucus.  You have a stiff neck.  You have changes in your: ? Vision. ? Hearing. ? Thinking. ? Mood. This information is not intended to replace advice given to you by your health care provider. Make sure you discuss any questions you have with your health care provider. Document Released: 02/03/2008 Document Revised: 04/19/2016 Document Reviewed: 11/22/2013 Elsevier Interactive Patient Education  2018 [employer].

## 2018-07-13 NOTE — Assessment & Plan Note (Addendum)
Here for an acute visit today Continue current medications Continue with scheduled F/U next week for BP recheck

## 2018-07-14 ENCOUNTER — Other Ambulatory Visit: Payer: Self-pay | Admitting: *Deleted

## 2018-07-14 DIAGNOSIS — I10 Essential (primary) hypertension: Secondary | ICD-10-CM

## 2018-07-14 MED ORDER — AMLODIPINE-OLMESARTAN 10-40 MG PO TABS: 1.0000 | ORAL_TABLET | Freq: Every day | ORAL | 1 refills | Status: DC

## 2018-07-20 ENCOUNTER — Ambulatory Visit: Payer: PRIVATE HEALTH INSURANCE | Admitting: Nurse Practitioner

## 2018-07-20 DIAGNOSIS — Z0289 Encounter for other administrative examinations: Secondary | ICD-10-CM

## 2018-08-04 ENCOUNTER — Encounter: Payer: Self-pay | Admitting: Internal Medicine

## 2018-08-04 ENCOUNTER — Ambulatory Visit: Payer: PRIVATE HEALTH INSURANCE | Admitting: Internal Medicine

## 2018-08-04 VITALS — BP 150/100 | HR 96 | Ht 66.0 in | Wt 206.0 lb

## 2018-08-04 DIAGNOSIS — E785 Hyperlipidemia, unspecified: Secondary | ICD-10-CM | POA: Diagnosis not present

## 2018-08-04 DIAGNOSIS — E119 Type 2 diabetes mellitus without complications: Secondary | ICD-10-CM

## 2018-08-04 LAB — POCT GLYCOSYLATED HEMOGLOBIN (HGB A1C): HEMOGLOBIN A1C: 8.3 % — AB (ref 4.0–5.6)

## 2018-08-04 NOTE — Patient Instructions (Addendum)
Please continue: - Metformin ER 500 mg 2x a day with meals  - Ozempic 0.5 mg weekly in a.m.   Please stop insulin completely.  Please return in 3 months with your sugar log.

## 2018-08-04 NOTE — Addendum Note (Signed)
Addended by: Cardell Peach I on: 08/04/2018 03:59 PM   Modules accepted: Orders

## 2018-08-04 NOTE — Progress Notes (Signed)
Patient ID: Casey Acosta, female   DOB: 03-08-1971, 47 y.o.   MRN: [id number]   HPI: Casey Acosta is a 47 y.o.-year-old female, initially referred by her PCP, Dr. Glendon Axe, for management of DM2, dx in 2010, insulin-dependent, uncontrolled, with complications (CKD).  Last visit 3.5 months ago.  Last hemoglobin A1c was: Lab Results  Component Value Date   HGBA1C 12.2 (A) 04/22/2018   HGBA1C 14.9 (H) 02/24/2018   At last visit, she was on: - Levemir 10 >> 15 units at bedtime -started 02/24/2018 She has been Metformin >> tolerated this well, but stopped 2/2 CKD. Had diarrhea with the regular formulation  She tried [place] >> tolerated this well, but not covered by her insurance.  At that time, we changed to: - Levemir 25 units at bedtime  - takes this 3x a week - Metformin ER 500 mg 2x a day with meals  - Ozempic 0.5 mg weekly in a.m. >> decreased appetite and had nausea after she started Ozempic, but now improved  Pt checks her sugars 1-2x a day -significantly improved: - am: 300s >> 95-134 - 2h after b'fast: n/c - before lunch: n/c - 2h after lunch: n/c - before dinner: n/c - 2h after dinner: 300s >> 140-180 - bedtime: n/c - nighttime: n/c Lowest sugar was 200 >> 95. Highest sugar was 542 >> 200.  Glucometer: ReliOn  Pt's meals are: - Breakfast: boiled egg + bacon + toast; bisquit ([employer]) - Lunch: salad, chicken salad + croissant - Dinner: may skip - Snacks: chips, crackers  -+ CKD, last BUN/creatinine:  Lab Results  Component Value Date   BUN 18 05/20/2018   BUN 22 02/23/2018   CREATININE 1.34 (H) 05/20/2018   CREATININE 1.47 (H) 02/23/2018  On olmesartan 40.  -+ HL; last set of lipids: Lab Results  Component Value Date   CHOL 222 (H) 05/20/2018   HDL 37.10 (L) 05/20/2018   LDLDIRECT 120.0 05/20/2018   TRIG 381.0 (H) 05/20/2018   CHOLHDL 6 05/20/2018  On Lipitor 40.  - last eye exam was in 2019: No DR  -No numbness and tingling in her  feet.  Pt has FH of DM in mother and sister.  ROS: Constitutional: no weight gain/+ weight loss, no fatigue, no subjective hyperthermia, no subjective hypothermia Eyes: no blurry vision, no xerophthalmia ENT: no sore throat, no nodules palpated in neck, no dysphagia, no odynophagia, no hoarseness Cardiovascular: no CP/no SOB/no palpitations/no leg swelling Respiratory: no cough/no SOB/no wheezing Gastrointestinal: + N/no V/no D/no C/no acid reflux Musculoskeletal: no muscle aches/no joint aches Skin: no rashes, no hair loss Neurological: no tremors/no numbness/no tingling/no dizziness  I reviewed pt's medications, allergies, PMH, social hx, family hx, and changes were documented in the history of present illness. Otherwise, unchanged from my initial visit note.   Past Medical History:  Diagnosis Date  . Anemia   . Anxiety   . Anxiety and depression   . Chronic kidney disease   . Colon polyp   . Diabetes mellitus without complication ([place])   . Dysuria   . High risk sexual behavior   . History of syncope   . Hyperlipidemia   . Hypertension   . Internal hemorrhoids   . LVH (left ventricular hypertrophy)   . Malaise and fatigue   . Obesity (BMI 30-39.9)   . Ovarian mass   . Panic disorder   . PCOS (polycystic ovarian syndrome)   . Rectal bleeding   . Shortness of breath   .  Surgical menopause   . Thyromegaly   . Vaginal trichomoniasis    Past Surgical History:  Procedure Laterality Date  . ABDOMINAL HYSTERECTOMY     partial  . APPENDECTOMY    . CHOLECYSTECTOMY    . TUBAL LIGATION     Social History   Socioeconomic History  . Marital status: Single    Spouse name: Not on file  . Number of children: 2  . Years of education: Not on file  . Highest education level: Not on file  Occupational History  . nurse  Social Needs  . Financial resource strain: Not on file  . Food insecurity:    Worry: Not on file    Inability: Not on file  . Transportation needs:     Medical: Not on file    Non-medical: Not on file  Tobacco Use  . Smoking status: Never Smoker  . Smokeless tobacco: Never Used  Substance and Sexual Activity  . Alcohol use:  Occasional 1 glass of wine  . Drug use: No   Current Outpatient Medications on File Prior to Visit  Medication Sig Dispense Refill  . amLODipine-olmesartan (AZOR) 10-40 MG tablet Take 1 tablet by mouth daily. 90 tablet 1  . atorvastatin (LIPITOR) 40 MG tablet TAKE 1 TABLET BY MOUTH ONCE DAILY 90 tablet 1  . blood glucose meter kit and supplies KIT Dispense based on patient and insurance preference. Use up to four times daily as directed. Dx Code-E11.9 1 each 0  . cloNIDine (CATAPRES) 0.1 MG tablet Take 1 tablet (0.1 mg total) by mouth daily. 30 tablet 1  . insulin detemir (LEVEMIR) 100 UNIT/ML injection Inject 0.25 mLs (25 Units total) into the skin at bedtime. Taper up as directed. 15 mL 5  . metFORMIN (GLUCOPHAGE-XR) 500 MG 24 hr tablet Take 1 tablet (500 mg total) by mouth daily with breakfast. 60 tablet 5  . metoprolol succinate (TOPROL-XL) 100 MG 24 hr tablet TAKE 1 TABLET BY MOUTH ONCE DAILY. TAKE WITH OR IMMEDIATELY FOLLOWING A MEAL. 90 tablet 1  . Semaglutide (OZEMPIC) 0.25 or 0.5 MG/DOSE SOPN Inject 0.5 mg into the skin once a week. 2 pen 3  . triamterene-hydrochlorothiazide (MAXZIDE-25) 37.5-25 MG tablet Take 1 tablet by mouth daily. 30 tablet 1  . venlafaxine XR (EFFEXOR XR) 37.5 MG 24 hr capsule Take 1 capsule (37.5 mg total) by mouth 2 (two) times daily. 60 capsule 1  . HYDROcodone-homatropine (HYCODAN) 5-1.5 MG/5ML syrup Take 5 mLs by mouth every 8 (eight) hours as needed for cough. (Patient not taking: Reported on 08/04/2018) 120 mL 0   No current facility-administered medications on file prior to visit.    Also, Levemir, 15 units at bedtime.  No Known Allergies Family History  Problem Relation Age of Onset  . Anxiety disorder Mother   . Hyperlipidemia Mother   . Depression Mother   . Hypertension  Mother   . Diabetes Mother   . Hypertension Sister   . Diabetes Sister    PE: BP (!) 150/100   Pulse 96   Ht '5\' 6"'$  (1.676 m)   Wt 206 lb (93.4 kg)   SpO2 98%   BMI 33.25 kg/m  Wt Readings from Last 3 Encounters:  08/04/18 206 lb (93.4 kg)  07/13/18 208 lb (94.3 kg)  06/17/18 209 lb 12.8 oz (95.2 kg)   Constitutional: overweight, in NAD Eyes: PERRLA, EOMI, no exophthalmos ENT: moist mucous membranes, no thyromegaly, no cervical lymphadenopathy Cardiovascular: Tachycardia, RR, No MRG Respiratory: CTA B Gastrointestinal:  abdomen soft, NT, ND, BS+ Musculoskeletal: no deformities, strength intact in all 4 Skin: moist, warm, no rashes Neurological: no tremor with outstretched hands, DTR normal in all 4  ASSESSMENT: 1. DM2, insulin-dependent, uncontrolled, with long-term complications - CKD  2.  Hyperlipidemia  3.  Obesity class I  PLAN:  1. Patient with longstanding, uncontrolled, type 2 diabetes, previously on long-acting insulin only, to which we added metformin and GLP-1 receptor agonist at last visit.  At that time, HbA1c was slightly better, at 12.2%, but still very high. Today, HbA1c is 8.3% (much better).  Her sugars improved significantly.  At last visit, almost all of her sugars were in the 300s, while now, they are at or close to goal. -We are using a half maximal dose of metformin due to her decreased kidney function. -She is tolerating Ozempic better now, but she had nausea initially.  She still has a decreased appetite.  She lost 10 pounds since last visit.  Overall, however, she feels very good. -We discussed about continuing the same regimen, but I advised her that if she does have nausea with Ozempic, to try to alternate 0.25 with 05 mg every other week -As of now, she tells me that she is taking her Levemir only approximately 3 times a week.  In this case, I advised her to try to stay off Levemir completely.  I did advise her to let me know if the sugars increase  as in that case we may need to restart Levemir, but at a lower dose. - I suggested to:  Patient Instructions  Please continue: - Metformin ER 500 mg 2x a day with meals  - Ozempic 0.5 mg weekly in a.m.   Please stop insulin completely.  Please return in 3 months with your sugar log.   - continue checking sugars at different times of the day - check 1x a day, rotating checks - advised for yearly eye exams >> she is UTD - Return to clinic in 3 mo with sugar log   2. HL - Reviewed latest lipid panel from 05/2018: LDL high, as were the triglycerides.  HDL low Lab Results  Component Value Date   CHOL 222 (H) 05/20/2018   HDL 37.10 (L) 05/20/2018   LDLDIRECT 120.0 05/20/2018   TRIG 381.0 (H) 05/20/2018   CHOLHDL 6 05/20/2018  - Continues the statin without side effects.  3.  Obesity class I -She lost 10 pounds since last visit -Continue Ozempic which should also help with weight loss  Philemon Kingdom, MD PhD [facility] Endocrinology

## 2018-08-15 ENCOUNTER — Encounter: Payer: Self-pay | Admitting: Nurse Practitioner

## 2018-08-15 DIAGNOSIS — F329 Major depressive disorder, single episode, unspecified: Secondary | ICD-10-CM

## 2018-08-15 DIAGNOSIS — I10 Essential (primary) hypertension: Secondary | ICD-10-CM

## 2018-08-15 DIAGNOSIS — F419 Anxiety disorder, unspecified: Secondary | ICD-10-CM

## 2018-08-18 MED ORDER — CLONIDINE HCL 0.1 MG PO TABS: 0.1000 mg | ORAL_TABLET | Freq: Every day | ORAL | 0 refills | Status: DC

## 2018-08-18 MED ORDER — METOPROLOL SUCCINATE ER 100 MG PO TB24: ORAL_TABLET | ORAL | 0 refills | Status: DC

## 2018-08-18 MED ORDER — VENLAFAXINE HCL ER 37.5 MG PO CP24: 37.5000 mg | ORAL_CAPSULE | Freq: Two times a day (BID) | ORAL | 0 refills | Status: DC

## 2018-08-18 MED ORDER — METFORMIN HCL ER 500 MG PO TB24: 500.0000 mg | ORAL_TABLET | Freq: Every day | ORAL | 0 refills | Status: DC

## 2018-08-18 MED ORDER — TRIAMTERENE-HCTZ 37.5-25 MG PO TABS: 1.0000 | ORAL_TABLET | Freq: Every day | ORAL | 1 refills | Status: DC

## 2018-08-18 NOTE — Telephone Encounter (Signed)
Reviewed chart pt is up-to-date sent refills to [facility].../lmb  

## 2018-08-30 ENCOUNTER — Ambulatory Visit: Payer: PRIVATE HEALTH INSURANCE | Admitting: Women's Health

## 2018-08-30 DIAGNOSIS — Z0289 Encounter for other administrative examinations: Secondary | ICD-10-CM

## 2018-09-01 ENCOUNTER — Telehealth: Payer: Self-pay | Admitting: Internal Medicine

## 2018-09-01 NOTE — Telephone Encounter (Signed)
At last visit, I advised her to stop insulin completely.  If she is still off?

## 2018-09-01 NOTE — Telephone Encounter (Signed)
Patients insurance has called stating that they cover [place] for. Will be faxing a request also. Please Advise, thanks

## 2018-09-01 NOTE — Telephone Encounter (Signed)
Just an FYI

## 2018-09-15 NOTE — Telephone Encounter (Signed)
Patient is not taking any insulin

## 2018-09-19 ENCOUNTER — Other Ambulatory Visit: Payer: Self-pay | Admitting: Nurse Practitioner

## 2018-09-19 DIAGNOSIS — I10 Essential (primary) hypertension: Secondary | ICD-10-CM

## 2018-09-19 MED ORDER — METFORMIN HCL ER 500 MG PO TB24: 500.0000 mg | ORAL_TABLET | Freq: Every day | ORAL | 0 refills | Status: DC

## 2018-09-19 MED ORDER — CLONIDINE HCL 0.1 MG PO TABS: 0.1000 mg | ORAL_TABLET | Freq: Every day | ORAL | 0 refills | Status: DC

## 2018-09-19 MED ORDER — METOPROLOL SUCCINATE ER 100 MG PO TB24: ORAL_TABLET | ORAL | 0 refills | Status: DC

## 2018-09-20 MED ORDER — BLOOD GLUCOSE MONITOR KIT: PACK | 0 refills | Status: DC

## 2018-10-17 ENCOUNTER — Encounter: Payer: Self-pay | Admitting: Internal Medicine

## 2018-10-17 ENCOUNTER — Other Ambulatory Visit: Payer: Self-pay | Admitting: Internal Medicine

## 2018-10-17 MED ORDER — DULAGLUTIDE 1.5 MG/0.5ML ~~LOC~~ SOAJ: 1.5000 mg | SUBCUTANEOUS | 11 refills | Status: DC

## 2018-11-15 ENCOUNTER — Telehealth: Payer: Self-pay | Admitting: Internal Medicine

## 2018-11-15 ENCOUNTER — Ambulatory Visit: Payer: PRIVATE HEALTH INSURANCE | Admitting: Internal Medicine

## 2018-11-15 DIAGNOSIS — E669 Obesity, unspecified: Secondary | ICD-10-CM | POA: Insufficient documentation

## 2018-11-15 NOTE — Progress Notes (Deleted)
Patient ID: Casey Acosta, female   DOB: 07/07/1971, 48 y.o.   MRN: [id number]   HPI: Casey Acosta is a 48 y.o.-year-old female, initially referred by her PCP, Dr. Glendon Axe, for management of DM2, dx in 2010, insulin-dependent, uncontrolled, with complications (CKD).  Last visit 3 mo ago.  Last hemoglobin A1c was: Lab Results  Component Value Date   HGBA1C 8.3 (A) 08/04/2018   HGBA1C 12.2 (A) 04/22/2018   HGBA1C 14.9 (H) 02/24/2018   Previously on: - Levemir 10 >> 15 units at bedtime -started 02/24/2018 She has been Metformin >> tolerated this well, but stopped 2/2 CKD. Had diarrhea with the regular formulation  She tried [place] >> tolerated this well, but not covered by her insurance.  Now on: - Metformin ER 500 mg 2x a day with meals  - Ozempic 0.5 mg weekly in a.m. >> decreased appetite and had nausea after she started Ozempic, but then improved >> Trulicity 1.5 mg weekly Stopped Levemir 25 units daily in 07/2018  Pt checks her sugars 1-2x a day: - am: 300s >> 95-134 - 2h after b'fast: n/c - before lunch: n/c - 2h after lunch: n/c - before dinner: n/c - 2h after dinner: 300s >> 140-180 - bedtime: n/c - nighttime: n/c Lowest sugar was 200 >> 95. Highest sugar was 542 >> 200.  Glucometer: ReliOn  Pt's meals are: - Breakfast: boiled egg + bacon + toast; bisquit ([employer]) - Lunch: salad, chicken salad + croissant - Dinner: may skip - Snacks: chips, crackers  -+ CKD, last BUN/creatinine:  Lab Results  Component Value Date   BUN 18 05/20/2018   BUN 22 02/23/2018   CREATININE 1.34 (H) 05/20/2018   CREATININE 1.47 (H) 02/23/2018  On Olmesartan 40.  -+ HL; last set of lipids: Lab Results  Component Value Date   CHOL 222 (H) 05/20/2018   HDL 37.10 (L) 05/20/2018   LDLDIRECT 120.0 05/20/2018   TRIG 381.0 (H) 05/20/2018   CHOLHDL 6 05/20/2018  On Lipitor 40.  - last eye exam was in 2019: No DR  - no numbness and tingling in her feet.  Pt has FH of DM  in mother and sister.  ROS: Constitutional: no weight gain/no weight loss, no fatigue, no subjective hyperthermia, no subjective hypothermia Eyes: no blurry vision, no xerophthalmia ENT: no sore throat, no nodules palpated in neck, no dysphagia, no odynophagia, no hoarseness Cardiovascular: no CP/no SOB/no palpitations/no leg swelling Respiratory: no cough/no SOB/no wheezing Gastrointestinal: no N/no V/no D/no C/no acid reflux Musculoskeletal: no muscle aches/no joint aches Skin: no rashes, no hair loss Neurological: no tremors/no numbness/no tingling/no dizziness  I reviewed pt's medications, allergies, PMH, social hx, family hx, and changes were documented in the history of present illness. Otherwise, unchanged from my initial visit note.   Past Medical History:  Diagnosis Date  . Anemia   . Anxiety   . Anxiety and depression   . Chronic kidney disease   . Colon polyp   . Diabetes mellitus without complication ([place])   . Dysuria   . High risk sexual behavior   . History of syncope   . Hyperlipidemia   . Hypertension   . Internal hemorrhoids   . LVH (left ventricular hypertrophy)   . Malaise and fatigue   . Obesity (BMI 30-39.9)   . Ovarian mass   . Panic disorder   . PCOS (polycystic ovarian syndrome)   . Rectal bleeding   . Shortness of breath   . Surgical menopause   .  Thyromegaly   . Vaginal trichomoniasis    Past Surgical History:  Procedure Laterality Date  . ABDOMINAL HYSTERECTOMY     partial  . APPENDECTOMY    . CHOLECYSTECTOMY    . TUBAL LIGATION     Social History   Socioeconomic History  . Marital status: Single    Spouse name: Not on file  . Number of children: 2  . Years of education: Not on file  . Highest education level: Not on file  Occupational History  . nurse  Social Needs  . Financial resource strain: Not on file  . Food insecurity:    Worry: Not on file    Inability: Not on file  . Transportation needs:    Medical: Not on file     Non-medical: Not on file  Tobacco Use  . Smoking status: Never Smoker  . Smokeless tobacco: Never Used  Substance and Sexual Activity  . Alcohol use:  Occasional 1 glass of wine  . Drug use: No   Current Outpatient Medications on File Prior to Visit  Medication Sig Dispense Refill  . amLODipine-olmesartan (AZOR) 10-40 MG tablet Take 1 tablet by mouth daily. 90 tablet 1  . atorvastatin (LIPITOR) 40 MG tablet TAKE 1 TABLET BY MOUTH ONCE DAILY 90 tablet 1  . blood glucose meter kit and supplies KIT Dispense based on patient and insurance preference. Use up to four times daily as directed. 1 each 0  . cloNIDine (CATAPRES) 0.1 MG tablet Take 1 tablet (0.1 mg total) by mouth daily. 90 tablet 0  . Dulaglutide (TRULICITY) 1.5 ON/6.2XB SOPN Inject 1.5 mg into the skin once a week. 4 pen 11  . HYDROcodone-homatropine (HYCODAN) 5-1.5 MG/5ML syrup Take 5 mLs by mouth every 8 (eight) hours as needed for cough. (Patient not taking: Reported on 08/04/2018) 120 mL 0  . metFORMIN (GLUCOPHAGE-XR) 500 MG 24 hr tablet Take 1 tablet (500 mg total) by mouth daily with breakfast. 180 tablet 0  . metoprolol succinate (TOPROL-XL) 100 MG 24 hr tablet TAKE 1 TABLET BY MOUTH ONCE DAILY. TAKE WITH OR IMMEDIATELY FOLLOWING A MEAL. 90 tablet 0  . triamterene-hydrochlorothiazide (MAXZIDE-25) 37.5-25 MG tablet Take 1 tablet by mouth daily. 30 tablet 1  . venlafaxine XR (EFFEXOR XR) 37.5 MG 24 hr capsule Take 1 capsule (37.5 mg total) by mouth 2 (two) times daily. 180 capsule 0   No current facility-administered medications on file prior to visit.    Also, Levemir, 15 units at bedtime.  No Known Allergies Family History  Problem Relation Age of Onset  . Anxiety disorder Mother   . Hyperlipidemia Mother   . Depression Mother   . Hypertension Mother   . Diabetes Mother   . Hypertension Sister   . Diabetes Sister    PE: There were no vitals taken for this visit. Wt Readings from Last 3 Encounters:  08/04/18 206 lb  (93.4 kg)  07/13/18 208 lb (94.3 kg)  06/17/18 209 lb 12.8 oz (95.2 kg)   Constitutional: overweight, in NAD Eyes: PERRLA, EOMI, no exophthalmos ENT: moist mucous membranes, no thyromegaly, no cervical lymphadenopathy Cardiovascular: RRR, No MRG Respiratory: CTA B Gastrointestinal: abdomen soft, NT, ND, BS+ Musculoskeletal: no deformities, strength intact in all 4 Skin: moist, warm, no rashes Neurological: no tremor with outstretched hands, DTR normal in all 4  ASSESSMENT: 1. DM2, insulin-dependent, uncontrolled, with long-term complications - CKD  2.  Hyperlipidemia  3.  Obesity class I  PLAN:  1. Patient with longstanding, uncontrolled, type  2 diabetes, previously on long-acting insulin along with metformin and GLP-1 receptor agonist, but with improved sugars at last visit, after which we could stop insulin completely.  She did have some nausea with Ozempic and we discussed about trying to alternate 0.25 with 0.5 mg every other week. However, she is now on Trulicity 2/2 insurance preference.  We have to continue with half maximum dose of metformin due to her decreased kidney function. Latest HbA1c was much improved, from 12.2% to 8.3%.  - I suggested to:  Patient Instructions  Please continue: - Metformin ER 500 mg 2x a day with meals  - Trulicity 1.5 mg weekly in a.m.   Please return in 3-4 months with your sugar log.   - today, HbA1c is 7%  - continue checking sugars at different times of the day - check 1x a day, rotating checks - advised for yearly eye exams >> she is UTD - Return to clinic in 3-4 mo with sugar log    2. HL - Reviewed latest lipid panel from 05/2018: LDL higher than target, as were her triglycerides.  HDL low: Lab Results  Component Value Date   CHOL 222 (H) 05/20/2018   HDL 37.10 (L) 05/20/2018   LDLDIRECT 120.0 05/20/2018   TRIG 381.0 (H) 05/20/2018   CHOLHDL 6 05/20/2018  - Continues the statin without side effects.  3.  Obesity class I -She  lost 10 pounds before last visit -At last visit, we were also able to stop insulin completely -Continue GLP1 R agonist, which should also help with weight loss  Philemon Kingdom, MD PhD [facility] Endocrinology

## 2018-11-15 NOTE — Telephone Encounter (Signed)
3mo

## 2018-11-15 NOTE — Telephone Encounter (Signed)
Patient no showed today's appt. Please advise on how to follow up. °A. No follow up necessary. °B. Follow up urgent. Contact patient immediately. °C. Follow up necessary. Contact patient and schedule visit in ___ days. °D. Follow up advised. Contact patient and schedule visit in ____weeks. ° °Would you like the NS fee to be applied to this visit? ° °

## 2018-11-17 ENCOUNTER — Ambulatory Visit: Payer: PRIVATE HEALTH INSURANCE | Admitting: Internal Medicine

## 2018-11-23 NOTE — Progress Notes (Deleted)
Virtual Visit via Video Note  I connected with Casey Acosta on 11/23/18 at 10:30 AM EDT by a video enabled telemedicine application and verified that I am speaking with the correct person using two identifiers.   I discussed the limitations of evaluation and management by telemedicine and the availability of in person appointments. The patient expressed understanding and agreed to proceed.  History of Present Illness: She is here for follow up of her chronic medical conditions.   Hypertension: She is taking her medication daily. She is compliant with a low sodium diet.  She denies chest pain, palpitations, edema, shortness of breath and regular headaches. She does not monitor her blood pressure at home.    Diabetes: She is taking her medication daily as prescribed. She is compliant with a diabetic diet.  She monitors her sugars and they have been running XXX. She checks her feet daily and denies foot lesions. She is up-to-date with an ophthalmology examination.   Hyperlipidemia: She is taking her medication daily. She is compliant with a low fat/cholesterol diet. She denies myalgias.   Depression, anxiety: She is taking her medication daily as prescribed. She denies any side effects from the medication. She feels her depression and anxiety are well controlled and she is happy with her current dose of medication.   CKD:    Obesity:      Observations/Objective:    Lab Results  Component Value Date   WBC 10.3 05/20/2018   HGB 12.6 05/20/2018   HCT 37.9 05/20/2018   PLT 311.0 05/20/2018   GLUCOSE 277 (H) 05/20/2018   CHOL 222 (H) 05/20/2018   TRIG 381.0 (H) 05/20/2018   HDL 37.10 (L) 05/20/2018   LDLDIRECT 120.0 05/20/2018   ALT 22 05/20/2018   AST 15 05/20/2018   NA 136 05/20/2018   K 3.8 05/20/2018   CL 99 05/20/2018   CREATININE 1.34 (H) 05/20/2018   BUN 18 05/20/2018   CO2 28 05/20/2018   HGBA1C 8.3 (A) 08/04/2018   MICROALBUR 95.1 (H) 02/23/2018    Assessment and  Plan:  See Problem List for Assessment and Plan of chronic medical problems.   Follow Up Instructions:    I discussed the assessment and treatment plan with the patient. The patient was provided an opportunity to ask questions and all were answered. The patient agreed with the plan and demonstrated an understanding of the instructions.   The patient was advised to call back or seek an in-person evaluation if the symptoms worsen or if the condition fails to improve as anticipated.  I provided *** minutes of non-face-to-face time during this encounter.   Binnie Rail, MD

## 2018-11-24 ENCOUNTER — Ambulatory Visit: Payer: PRIVATE HEALTH INSURANCE | Admitting: Nurse Practitioner

## 2018-11-24 ENCOUNTER — Ambulatory Visit: Payer: PRIVATE HEALTH INSURANCE | Admitting: Internal Medicine

## 2018-11-24 ENCOUNTER — Other Ambulatory Visit: Payer: Self-pay

## 2018-11-28 ENCOUNTER — Other Ambulatory Visit: Payer: Self-pay

## 2018-11-28 ENCOUNTER — Ambulatory Visit: Payer: BLUE CROSS/BLUE SHIELD | Admitting: Women's Health

## 2018-11-28 ENCOUNTER — Encounter: Payer: Self-pay | Admitting: Women's Health

## 2018-11-28 VITALS — BP 140/90 | Ht 66.0 in | Wt 201.0 lb

## 2018-11-28 DIAGNOSIS — N898 Other specified noninflammatory disorders of vagina: Secondary | ICD-10-CM | POA: Diagnosis not present

## 2018-11-28 DIAGNOSIS — Z113 Encounter for screening for infections with a predominantly sexual mode of transmission: Secondary | ICD-10-CM | POA: Diagnosis not present

## 2018-11-28 DIAGNOSIS — A5901 Trichomonal vulvovaginitis: Secondary | ICD-10-CM

## 2018-11-28 DIAGNOSIS — Z01419 Encounter for gynecological examination (general) (routine) without abnormal findings: Secondary | ICD-10-CM | POA: Diagnosis not present

## 2018-11-28 LAB — WET PREP FOR TRICH, YEAST, CLUE

## 2018-11-28 MED ORDER — FLUCONAZOLE 150 MG PO TABS: 150.0000 mg | ORAL_TABLET | Freq: Once | ORAL | 0 refills | Status: AC

## 2018-11-28 MED ORDER — METRONIDAZOLE 500 MG PO TABS: ORAL_TABLET | ORAL | 0 refills | Status: DC

## 2018-11-28 NOTE — Progress Notes (Signed)
Casey Acosta 1971/05/28 [id number]    History:    Presents for annual exam.  TAH for fibroids/menorrhagia.  Primary care manages hypertension, diabetes, anxiety/depression and hypercholesteremia.  Reports hemoglobin A1c was 12 now at  8.  Same partner 3 years.  Normal Pap and mammogram history, overdue for mammogram.  Past medical history, past surgical history, family history and social history were all reviewed and documented in the EPIC chart.  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  2 children ages 58 and 58 both doing well.  Mother hypertension diabetes, father hypertension.  ROS:  A ROS was performed and pertinent positives and negatives are included.  Exam:  Vitals:   11/28/18 0937  BP: 140/90  Weight: 201 lb (91.2 kg)  Height: 5\' 6"  (1.676 m)   Body mass index is 32.44 kg/m.   General appearance:  Normal Thyroid:  Symmetrical, normal in size, without palpable masses or nodularity. Respiratory  Auscultation:  Clear without wheezing or rhonchi Cardiovascular  Auscultation:  Regular rate, without rubs, murmurs or gallops  Edema/varicosities:  Not grossly evident Abdominal  Soft,nontender, without masses, guarding or rebound.  Liver/spleen:  No organomegaly noted  Hernia:  None appreciated  Skin  Inspection:  Grossly normal   Breasts: Examined lying and sitting.     Right: Without masses, retractions, discharge or axillary adenopathy.     Left: Without masses, retractions, discharge or axillary adenopathy. Gentitourinary   Inguinal/mons:  Normal without inguinal adenopathy  External genitalia:  Normal  BUS/Urethra/Skene's glands:  Normal  Vagina: Erythemic, wet prep positive for trichomonas, clues, TNTC bacteria.  Cervix: And uterus absent  Adnexa/parametria:     Rt: Without masses or tenderness.   Lt: Without masses or tenderness.  Anus and perineum: Normal  Digital rectal exam: Normal sphincter tone without palpated masses or tenderness  Assessment/Plan:  48 y.o. SBF  G4 P2 for annual exam with complaint of dyspareunia and vaginal irritation for months.  TAH for fibroids Trichomonas Diabetes, hypertension, anxiety/depression, hypercholesteremia-primary care manages labs and meds Obesity  Plan: Flagyl 2 g p.o. for both she and partner.  Alcohol precautions reviewed.  Instructed to call if continued vaginal issues.  Diflucan 150 p.o. x1 dose if needed.  SBEs, reviewed importance of annual screening mammogram, breast center information given instructed to schedule.  Congratulated on recent weight loss, reviewed importance to continue to decrease simple carbs/calories and increase regular cardio type exercise.  Vitamin D 1000 daily encouraged.  GC/chlamydia, HIV, RPR pending.Huel C[facility], 10:04 AM 11/28/2018

## 2018-11-28 NOTE — Patient Instructions (Addendum)
Health Maintenance for Postmenopausal Women Menopause is a normal process in which your reproductive ability comes to an end. This process happens gradually over a span of months to years, usually between the ages of 62 and 89. Menopause is complete when you have missed 12 consecutive menstrual periods. It is important to talk with your health care provider about some of the most common conditions that affect postmenopausal women, such as heart disease, cancer, and bone loss (osteoporosis). Adopting a healthy lifestyle and getting preventive care can help to promote your health and wellness. Those actions can also lower your chances of developing some of these common conditions. What should I know about menopause? During menopause, you may experience a number of symptoms, such as:  Moderate-to-severe hot flashes.  Night sweats.  Decrease in sex drive.  Mood swings.  Headaches.  Tiredness.  Irritability.  Memory problems.  Insomnia. Choosing to treat or not to treat menopausal changes is an individual decision that you make with your health care provider. What should I know about hormone replacement therapy and supplements? Hormone therapy products are effective for treating symptoms that are associated with menopause, such as hot flashes and night sweats. Hormone replacement carries certain risks, especially as you become older. If you are thinking about using estrogen or estrogen with progestin treatments, discuss the benefits and risks with your health care provider. What should I know about heart disease and stroke? Heart disease, heart attack, and stroke become more likely as you age. This may be due, in part, to the hormonal changes that your body experiences during menopause. These can affect how your body processes dietary fats, triglycerides, and cholesterol. Heart attack and stroke are both medical emergencies. There are many things that you can do to help prevent heart disease  and stroke:  Have your blood pressure checked at least every 1-2 years. High blood pressure causes heart disease and increases the risk of stroke.  If you are 79-72 years old, ask your health care provider if you should take aspirin to prevent a heart attack or a stroke.  Do not use any tobacco products, including cigarettes, chewing tobacco, or electronic cigarettes. If you need help quitting, ask your health care provider.  It is important to eat a healthy diet and maintain a healthy weight. ? Be sure to include plenty of vegetables, fruits, low-fat dairy products, and lean protein. ? Avoid eating foods that are high in solid fats, added sugars, or salt (sodium).  Get regular exercise. This is one of the most important things that you can do for your health. ? Try to exercise for at least 150 minutes each week. The type of exercise that you do should increase your heart rate and make you sweat. This is known as moderate-intensity exercise. ? Try to do strengthening exercises at least twice each week. Do these in addition to the moderate-intensity exercise.  Know your numbers.Ask your health care provider to check your cholesterol and your blood glucose. Continue to have your blood tested as directed by your health care provider.  What should I know about cancer screening? There are several types of cancer. Take the following steps to reduce your risk and to catch any cancer development as early as possible. Breast Cancer  Practice breast self-awareness. ? This means understanding how your breasts normally appear and feel. ? It also means doing regular breast self-exams. Let your health care provider know about any changes, no matter how small.  If you are 40 or  older, have a clinician do a breast exam (clinical breast exam or CBE) every year. Depending on your age, family history, and medical history, it may be recommended that you also have a yearly breast X-ray (mammogram).  If you  have a family history of breast cancer, talk with your health care provider about genetic screening.  If you are at high risk for breast cancer, talk with your health care provider about having an MRI and a mammogram every year.  Breast cancer (BRCA) gene test is recommended for women who have family members with BRCA-related cancers. Results of the assessment will determine the need for genetic counseling and BRCA1 and for BRCA2 testing. BRCA-related cancers include these types: ? Breast. This occurs in males or females. ? Ovarian. ? Tubal. This may also be called fallopian tube cancer. ? Cancer of the abdominal or pelvic lining (peritoneal cancer). ? Prostate. ? Pancreatic. Cervical, Uterine, and Ovarian Cancer Your health care provider may recommend that you be screened regularly for cancer of the pelvic organs. These include your ovaries, uterus, and vagina. This screening involves a pelvic exam, which includes checking for microscopic changes to the surface of your cervix (Pap test).  For women ages 21-65, health care providers may recommend a pelvic exam and a Pap test every three years. For women ages 39-65, they may recommend the Pap test and pelvic exam, combined with testing for human papilloma virus (HPV), every five years. Some types of HPV increase your risk of cervical cancer. Testing for HPV may also be done on women of any age who have unclear Pap test results.  Other health care providers may not recommend any screening for nonpregnant women who are considered low risk for pelvic cancer and have no symptoms. Ask your health care provider if a screening pelvic exam is right for you.  If you have had past treatment for cervical cancer or a condition that could lead to cancer, you need Pap tests and screening for cancer for at least 20 years after your treatment. If Pap tests have been discontinued for you, your risk factors (such as having a new sexual partner) need to be reassessed  to determine if you should start having screenings again. Some women have medical problems that increase the chance of getting cervical cancer. In these cases, your health care provider may recommend that you have screening and Pap tests more often.  If you have a family history of uterine cancer or ovarian cancer, talk with your health care provider about genetic screening.  If you have vaginal bleeding after reaching menopause, tell your health care provider.  There are currently no reliable tests available to screen for ovarian cancer. Lung Cancer Lung cancer screening is recommended for adults 57-50 years old who are at high risk for lung cancer because of a history of smoking. A yearly low-dose CT scan of the lungs is recommended if you:  Currently smoke.  Have a history of at least 30 pack-years of smoking and you currently smoke or have quit within the past 15 years. A pack-year is smoking an average of one pack of cigarettes per day for one year. Yearly screening should:  Continue until it has been 15 years since you quit.  Stop if you develop a health problem that would prevent you from having lung cancer treatment. Colorectal Cancer  This type of cancer can be detected and can often be prevented.  Routine colorectal cancer screening usually begins at age 12 and continues through  age 80.  If you have risk factors for colon cancer, your health care provider may recommend that you be screened at an earlier age.  If you have a family history of colorectal cancer, talk with your health care provider about genetic screening.  Your health care provider may also recommend using home test kits to check for hidden blood in your stool.  A small camera at the end of a tube can be used to examine your colon directly (sigmoidoscopy or colonoscopy). This is done to check for the earliest forms of colorectal cancer.  Direct examination of the colon should be repeated every 5-10 years until  age 56. However, if early forms of precancerous polyps or small growths are found or if you have a family history or genetic risk for colorectal cancer, you may need to be screened more often. Skin Cancer  Check your skin from head to toe regularly.  Monitor any moles. Be sure to tell your health care provider: ? About any new moles or changes in moles, especially if there is a change in a mole's shape or color. ? If you have a mole that is larger than the size of a pencil eraser.  If any of your family members has a history of skin cancer, especially at a [place] Spitler age, talk with your health care provider about genetic screening.  Always use sunscreen. Apply sunscreen liberally and repeatedly throughout the day.  Whenever you are outside, protect yourself by wearing long sleeves, pants, a wide-brimmed hat, and sunglasses. What should I know about osteoporosis? Osteoporosis is a condition in which bone destruction happens more quickly than new bone creation. After menopause, you may be at an increased risk for osteoporosis. To help prevent osteoporosis or the bone fractures that can happen because of osteoporosis, the following is recommended:  If you are 32-39 years old, get at least 1,000 mg of calcium and at least 600 mg of vitamin D per day.  If you are older than age 53 but younger than age 57, get at least 1,200 mg of calcium and at least 600 mg of vitamin D per day.  If you are older than age 31, get at least 1,200 mg of calcium and at least 800 mg of vitamin D per day. Smoking and excessive alcohol intake increase the risk of osteoporosis. Eat foods that are rich in calcium and vitamin D, and do weight-bearing exercises several times each week as directed by your health care provider. What should I know about how menopause affects my mental health? Depression may occur at any age, but it is more common as you become older. Common symptoms of depression include:  Low or sad mood.   Changes in sleep patterns.  Changes in appetite or eating patterns.  Feeling an overall lack of motivation or enjoyment of activities that you previously enjoyed.  Frequent crying spells. Talk with your health care provider if you think that you are experiencing depression. What should I know about immunizations? It is important that you get and maintain your immunizations. These include:  Tetanus, diphtheria, and pertussis (Tdap) booster vaccine.  Influenza every year before the flu season begins.  Pneumonia vaccine.  Shingles vaccine. Your health care provider may also recommend other immunizations. This information is not intended to replace advice given to you by your health care provider. Make sure you discuss any questions you have with your health care provider. Document  Trichomoniasis Trichomoniasis is an STI (sexually transmitted infection) that can affect  both women and men. In women, the outer area of the female genitalia (vulva) and the vagina are affected. In men, the penis is mainly affected, but the prostate and other reproductive organs can also be involved. This condition can be treated with medicine. It often has no symptoms (is asymptomatic), especially in men. What are the causes? This condition is caused by an organism called Trichomonas vaginalis. Trichomoniasis most often spreads from person to person (is contagious) through sexual contact. What increases the risk? The following factors may make you more likely to develop this condition:  Having unprotected sexual intercourse.  Having sexual intercourse with a partner who has trichomoniasis.  Having multiple sexual partners.  Having had previous trichomoniasis infections or other STIs. What are the signs or symptoms? In women, symptoms of trichomoniasis include:  Abnormal vaginal discharge that is clear, white, gray, or yellow-green and foamy and has an unusual "fishy" odor.  Itching and irritation of  the vagina and vulva.  Burning or pain during urination or sexual intercourse.  Genital redness and swelling. In men, symptoms of trichomoniasis include:  Penile discharge that may be foamy or contain pus.  Pain in the penis. This may happen only when urinating.  Itching or irritation inside the penis.  Burning after urination or ejaculation. How is this diagnosed? In women, this condition may be found during a routine Pap test or physical exam. It may be found in men during a routine physical exam. Your health care provider may perform tests to help diagnose this infection, such as:  Urine tests (men and women).  The following in women: ? Testing the pH of the vagina. ? A vaginal swab test that checks for the Trichomonas vaginalis organism. ? Testing vaginal secretions. Your health care provider may test you for other STIs, including HIV (human immunodeficiency virus). How is this treated? This condition is treated with medicine taken by mouth (orally), such as metronidazole or tinidazole to fight the infection. Your sexual partner(s) may also need to be tested and treated.  If you are a woman and you plan to become pregnant or think you may be pregnant, tell your health care provider right away. Some medicines that are used to treat the infection should not be taken during pregnancy. Your health care provider may recommend over-the-counter medicines or creams to help relieve itching or irritation. You may be tested for infection again 3 months after treatment. Follow these instructions at home:  Take and use over-the-counter and prescription medicines, including creams, only as told by your health care provider.  Do not have sexual intercourse until one week after you finish your medicine, or until your health care provider approves. Ask your health care provider when you may resume sexual intercourse.  (Women) Do not douche or wear tampons while you have the infection.   Discuss your infection with your sexual partner(s). Make sure that your partner gets tested and treated, if necessary.  Keep all follow-up visits as told by your health care provider. This is important. How is this prevented?  Use condoms every time you have sex. Using condoms correctly and consistently can help protect against STIs.  Avoid having multiple sexual partners.  Talk with your sexual partner about any symptoms that either of you may have, as well as any history of STIs.  Get tested for STIs and STDs (sexually transmitted diseases) before you have sex. Ask your partner to do the same.  Do not have sexual contact if you have symptoms of  trichomoniasis or another STI. Contact a health care provider if:  You still have symptoms after you finish your medicine.  You develop pain in your abdomen.  You have pain when you urinate.  You have bleeding after sexual intercourse.  You develop a rash.  You feel nauseous or you vomit.  You plan to become pregnant or think you may be pregnant. Summary  Trichomoniasis is an STI (sexually transmitted infection) that can affect both women and men.  This condition often has no symptoms (is asymptomatic), especially in men.  You should not have sexual intercourse until one week after you finish your medicine, or until your health care provider approves. Ask your health care provider when you may resume sexual intercourse.  Discuss your infection with your sexual partner. Make sure that your partner gets tested and treated, if necessary. This information is not intended to replace advice given to you by your health care provider. Make sure you discuss any questions you have with your health care provider. Document Released: 02/10/2001 Document Revised: 07/10/2016 Document Reviewed: 07/10/2016 Elsevier Interactive Patient Education  2019 [employer]. : 10/09/2005 Document Revised: 03/06/2016 Document Reviewed: 05/21/2015 Elsevier  Interactive Patient Education  2019 [employer]  Vaginal Yeast infection, Adult  Vaginal yeast infection is a condition that causes vaginal discharge as well as soreness, swelling, and redness (inflammation) of the vagina. This is a common condition. Some women get this infection frequently. What are the causes? This condition is caused by a change in the normal balance of the yeast (candida) and bacteria that live in the vagina. This change causes an overgrowth of yeast, which causes the inflammation. What increases the risk? The condition is more likely to develop in women who:  Take antibiotic medicines.  Have diabetes.  Take birth control pills.  Are pregnant.  Douche often.  Have a weak body defense system (immune system).  Have been taking steroid medicines for a long time.  Frequently wear tight clothing. What are the signs or symptoms? Symptoms of this condition include:  White, thick, creamy vaginal discharge.  Swelling, itching, redness, and irritation of the vagina. The lips of the vagina (vulva) may be affected as well.  Pain or a burning feeling while urinating.  Pain during sex. How is this diagnosed? This condition is diagnosed based on:  Your medical history.  A physical exam.  A pelvic exam. Your health care provider will examine a sample of your vaginal discharge under a microscope. Your health care provider may send this sample for testing to confirm the diagnosis. How is this treated? This condition is treated with medicine. Medicines may be over-the-counter or prescription. You may be told to use one or more of the following:  Medicine that is taken by mouth (orally).  Medicine that is applied as a cream (topically).  Medicine that is inserted directly into the vagina (suppository). Follow these instructions at home:  Lifestyle  Do not have sex until your health care provider approves. Tell your sex partner that you have a yeast  infection. That person should go to his or her health care provider and ask if they should also be treated.  Do not wear tight clothes, such as pantyhose or tight pants.  Wear breathable cotton underwear. General instructions  Take or apply over-the-counter and prescription medicines only as told by your health care provider.  Eat more yogurt. This may help to keep your yeast infection from returning.  Do not use tampons until your health care  provider approves.  Try taking a sitz bath to help with discomfort. This is a warm water bath that is taken while you are sitting down. The water should only come up to your hips and should cover your buttocks. Do this 3-4 times per day or as told by your health care provider.  Do not douche.  If you have diabetes, keep your blood sugar levels under control.  Keep all follow-up visits as told by your health care provider. This is important. Contact a health care provider if:  You have a fever.  Your symptoms go away and then return.  Your symptoms do not get better with treatment.  Your symptoms get worse.  You have new symptoms.  You develop blisters in or around your vagina.  You have blood coming from your vagina and it is not your menstrual period.  You develop pain in your abdomen. Summary  Vaginal yeast infection is a condition that causes discharge as well as soreness, swelling, and redness (inflammation) of the vagina.  This condition is treated with medicine. Medicines may be over-the-counter or prescription.  Take or apply over-the-counter and prescription medicines only as told by your health care provider.  Do not douche. Do not have sex or use tampons until your health care provider approves.  Contact a health care provider if your symptoms do not get better with treatment or your symptoms go away and then return. This information is not intended to replace advice given to you by your health care provider. Make sure  you discuss any questions you have with your health care provider. Document Released: 05/27/2005 Document Revised: 01/03/2018 Document Reviewed: 01/03/2018 Elsevier Interactive Patient Education  2019 [employer].

## 2018-11-29 LAB — C. TRACHOMATIS/N. GONORRHOEAE RNA
C. TRACHOMATIS RNA, TMA: NOT DETECTED
N. gonorrhoeae RNA, TMA: NOT DETECTED

## 2018-11-29 LAB — HIV ANTIBODY (ROUTINE TESTING W REFLEX): HIV: NONREACTIVE

## 2018-11-29 LAB — RPR: RPR: NONREACTIVE

## 2019-01-02 ENCOUNTER — Encounter: Payer: Self-pay | Admitting: Internal Medicine

## 2019-01-02 ENCOUNTER — Telehealth: Payer: Self-pay | Admitting: *Deleted

## 2019-01-02 ENCOUNTER — Ambulatory Visit (INDEPENDENT_AMBULATORY_CARE_PROVIDER_SITE_OTHER): Payer: PRIVATE HEALTH INSURANCE | Admitting: Internal Medicine

## 2019-01-02 VITALS — Temp 97.7°F

## 2019-01-02 DIAGNOSIS — U071 COVID-19: Secondary | ICD-10-CM

## 2019-01-02 DIAGNOSIS — Z0000 Encounter for general adult medical examination without abnormal findings: Secondary | ICD-10-CM | POA: Insufficient documentation

## 2019-01-02 DIAGNOSIS — R0600 Dyspnea, unspecified: Secondary | ICD-10-CM

## 2019-01-02 DIAGNOSIS — E119 Type 2 diabetes mellitus without complications: Secondary | ICD-10-CM | POA: Diagnosis not present

## 2019-01-02 NOTE — Telephone Encounter (Signed)
Casey Acosta., [facility] Acosta [facility] called [facility] imaging re: chest CT ordered by Dr. Jenny Reichmann today for dyspnea and positive COVID 19. They are not scheduling CT scans for outpatient imaging.  Advised pt to go to [facility] ED if her sxs worsen. I advised pt of this and per Dr. Jenny Reichmann to keep monitoring her O2 saturation and temperature at home and go to [facility] ED if sxs worsen or O2 sat starts dropping. Pt verbalized understanding.

## 2019-01-02 NOTE — Patient Instructions (Signed)
We are unable to get the CT chest done as stat outpatient at this time at [facility].    Please continue to monitor your o2 sats, and proceed to [facility] ED for sat trending down or less than 90%  Please continue all other medications as before, and refills have been done if requested.  Please have the pharmacy call with any other refills you may need.  Please continue your efforts at being more active, low cholesterol diabetic diet,  Please keep your appointments with your specialists as you may have planned  Please return in 6 months, or sooner if needed

## 2019-01-02 NOTE — Assessment & Plan Note (Signed)
Concerning now for lower respiratory involvement, fortunately reliable o2 sats at home are normal, and pt with mild symptoms only; I have asked for stat CT chest at [facility] but this is not possible per scheduling, and all CT requests would need to come from the ED if needed; I d/w pt to continue current tx at home, cont to monitor o2 sat frequently every 15-30 min and to proceed directly to [facility] ED for o2 sat trending down at/near/below 90%

## 2019-01-02 NOTE — Assessment & Plan Note (Signed)
Relatively stable it seems, to follow cbgs closely, consider ED for CBG > 500 and call for persistent lesser cbgs; also due for f/u Endo with Dr Cruzita Lederer

## 2019-01-02 NOTE — Progress Notes (Signed)
Patient ID: Casey Acosta, female   DOB: Jun 13, 1971, 48 y.o.   MRN: [id number]  Virtual Visit via Video Note  I connected with Juanda Bond on 01/02/19 at 10:00 AM EDT by a video enabled telemedicine application and verified that I am speaking with the correct person using two identifiers.  Location: Patient: at home Provider: at office    I discussed the limitations of evaluation and management by telemedicine and the availability of in person appointments. The patient expressed understanding and agreed to proceed.  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  She was tested twice at [facility] - neg x 1, but the + after her symptoms began early last wk with cold chills, fatigue, malaise, No fever and no cough or sob until 3 days ago now with very mild prod infrequent cough and some mild DOE.  She has been at home, and has done her o2 sat frequently, even most of the day yesterday and o2 sats even with exertion have been 95-96%.  Denies fever throughout.   Pt denies polydipsia, polyuria,  Pt states overall good compliance with meds, trying to follow lower cholesterol, diabetic diet, cbgs has been in 100's - 200  Past Medical History:  Diagnosis Date  . Anemia   . Anxiety   . Anxiety and depression   . Chronic kidney disease   . Colon polyp   . Diabetes mellitus without complication ([place])   . Dysuria   . High risk sexual behavior   . History of syncope   . Hyperlipidemia   . Hypertension   . Internal hemorrhoids   . LVH (left ventricular hypertrophy)   . Malaise and fatigue   . Obesity (BMI 30-39.9)   . Ovarian mass   . Panic disorder   . PCOS (polycystic ovarian syndrome)   . Rectal bleeding   . Shortness of breath   . Surgical menopause   . Vaginal  trichomoniasis    Past Surgical History:  Procedure Laterality Date  . ABDOMINAL HYSTERECTOMY     partial  . APPENDECTOMY    . CHOLECYSTECTOMY    . TUBAL LIGATION      reports that she has never smoked. She has never used smokeless tobacco. She reports that she does not use drugs. No history on file for alcohol. family history includes ALS in her father; Anxiety disorder in her mother; Crohn's disease in her brother and sister; Depression in her mother; Diabetes in her mother and sister; Hyperlipidemia in her mother; Hypertension in her mother and sister; Thyroid disease in her mother. Allergies  Allergen Reactions  . Dilaudid [Hydromorphone Hcl] Nausea And Vomiting   Current Outpatient Medications on File Prior to Visit  Medication Sig Dispense Refill  . amLODipine-olmesartan (AZOR) 10-40 MG tablet Take 1 tablet by mouth daily. 90 tablet 1  . aspirin EC 81 MG tablet Take 81 mg by mouth daily.    Marland Kitchen atorvastatin (LIPITOR) 40 MG tablet TAKE 1 TABLET BY MOUTH ONCE DAILY 90 tablet 1  . blood glucose meter kit and supplies KIT Dispense based on patient and insurance preference. Use up to four times daily as directed. 1 each 0  . cloNIDine (CATAPRES) 0.1 MG tablet Take 1 tablet (0.1 mg total) by  mouth daily. 90 tablet 0  . escitalopram (LEXAPRO) 20 MG tablet Take 20 mg by mouth daily.    . metFORMIN (GLUCOPHAGE-XR) 500 MG 24 hr tablet Take 1 tablet (500 mg total) by mouth daily with breakfast. 180 tablet 0  . metoprolol succinate (TOPROL-XL) 100 MG 24 hr tablet TAKE 1 TABLET BY MOUTH ONCE DAILY. TAKE WITH OR IMMEDIATELY FOLLOWING A MEAL. 90 tablet 0  . Semaglutide,0.25 or 0.5MG/DOS, (OZEMPIC, 0.25 OR 0.5 MG/DOSE,) 2 MG/1.5ML SOPN Inject into the skin.    Marland Kitchen triamterene-hydrochlorothiazide (MAXZIDE-25) 37.5-25 MG tablet Take 1 tablet by mouth daily. 30 tablet 1   No current facility-administered medications on file prior to visit.     Observations/Objective: Alert, face mask in place, mild  ill appearing and fatigued, NAD, resps normal at rest, cn 2012 intact, moves all 4s, no visible swelling or rash Lab Results  Component Value Date   WBC 10.3 05/20/2018   HGB 12.6 05/20/2018   HCT 37.9 05/20/2018   PLT 311.0 05/20/2018   GLUCOSE 277 (H) 05/20/2018   CHOL 222 (H) 05/20/2018   TRIG 381.0 (H) 05/20/2018   HDL 37.10 (L) 05/20/2018   LDLDIRECT 120.0 05/20/2018   ALT 22 05/20/2018   AST 15 05/20/2018   NA 136 05/20/2018   K 3.8 05/20/2018   CL 99 05/20/2018   CREATININE 1.34 (H) 05/20/2018   BUN 18 05/20/2018   CO2 28 05/20/2018   HGBA1C 8.3 (A) 08/04/2018   MICROALBUR 95.1 (H) 02/23/2018   Assessment and Plan: See notes  Follow Up Instructions: See notes   I discussed the assessment and treatment plan with the patient. The patient was provided an opportunity to ask questions and all were answered. The patient agreed with the plan and demonstrated an understanding of the instructions.   The patient was advised to call back or seek an in-person evaluation if the symptoms worsen or if the condition fails to improve as anticipated.    Cathlean Cower, MD

## 2019-01-02 NOTE — Assessment & Plan Note (Signed)
Also d/w pt current supportive care measures of which she is aware, with rest, fluids, tylenol prn

## 2019-01-03 ENCOUNTER — Encounter: Payer: Self-pay | Admitting: Internal Medicine

## 2019-01-14 ENCOUNTER — Encounter: Payer: Self-pay | Admitting: Internal Medicine

## 2019-01-14 ENCOUNTER — Encounter: Payer: Self-pay | Admitting: Women's Health

## 2019-01-17 ENCOUNTER — Telehealth: Payer: Self-pay | Admitting: Family

## 2019-01-17 NOTE — Telephone Encounter (Signed)
Patient scheduled appointment for May 27.

## 2019-01-17 NOTE — Telephone Encounter (Signed)
Please advise on TOC

## 2019-01-17 NOTE — Telephone Encounter (Signed)
PATIENT WOULD LIKE TO [place]

## 2019-01-17 NOTE — Telephone Encounter (Signed)
Copied from [place] [id number]. Topic: General - Other >> Jan 17, 2019 12:29 PM Carolyn Stare wrote:  Pt call to say she is more comfortable with a female doctor and would like to transfer to Jodi Mourning

## 2019-01-17 NOTE — Telephone Encounter (Signed)
Ok with me 

## 2019-01-19 ENCOUNTER — Ambulatory Visit: Payer: PRIVATE HEALTH INSURANCE

## 2019-01-24 ENCOUNTER — Ambulatory Visit (INDEPENDENT_AMBULATORY_CARE_PROVIDER_SITE_OTHER): Payer: PRIVATE HEALTH INSURANCE | Admitting: Internal Medicine

## 2019-01-24 ENCOUNTER — Encounter: Payer: Self-pay | Admitting: Internal Medicine

## 2019-01-24 ENCOUNTER — Other Ambulatory Visit: Payer: Self-pay

## 2019-01-24 DIAGNOSIS — E785 Hyperlipidemia, unspecified: Secondary | ICD-10-CM | POA: Diagnosis not present

## 2019-01-24 DIAGNOSIS — E1121 Type 2 diabetes mellitus with diabetic nephropathy: Secondary | ICD-10-CM | POA: Diagnosis not present

## 2019-01-24 DIAGNOSIS — E669 Obesity, unspecified: Secondary | ICD-10-CM | POA: Diagnosis not present

## 2019-01-24 MED ORDER — METFORMIN HCL ER 500 MG PO TB24: 500.0000 mg | ORAL_TABLET | Freq: Every day | ORAL | 3 refills | Status: DC

## 2019-01-24 NOTE — Patient Instructions (Addendum)
Please continue: - Metformin ER 500 mg 2x a day with meals  - Ozempic 0.5 mg weekly in a.m.    Please return in 4 months with your sugar log.

## 2019-01-24 NOTE — Progress Notes (Signed)
Patient ID: Casey Acosta, female   DOB: 09-12-1970, 48 y.o.   MRN: [id number]   Patient location: Home My location: Office  Referring Provider: Biagio Borg, MD  I connected with the patient on 01/24/19 at  4:34 PM EDT by phone application and verified that I am speaking with the correct person.   I discussed the limitations of evaluation and management by phone and the availability of in person appointments. The patient expressed understanding and agreed to proceed.   Details of the encounter are shown below.  HPI: Casey Acosta is a 48 y.o.-year-old female, initially referred by her PCP, Dr. Glendon Axe, returning for follow-up for DM2, dx in 2010, insulin-dependent, uncontrolled, with complications (CKD).  Last visit 5 months ago.  Last hemoglobin A1c was: Lab Results  Component Value Date   HGBA1C 8.3 (A) 08/04/2018   HGBA1C 12.2 (A) 04/22/2018   HGBA1C 14.9 (H) 02/24/2018   At last visit, she was on: - Levemir 10 >> 15 units at bedtime -started 02/24/2018 She has been Metformin >> tolerated this well, but stopped 2/2 CKD. Had diarrhea with the regular formulation  She tried [place] >> tolerated this well, but not covered by her insurance.  She is now on: - Metformin ER 500 mg 2x a day with meals  - Ozempic 0.5 mg weekly in a.m.  - samples At last visit, she was on Levemir 25 units daily but only taking it 3 times a week, so we stopped.  Pt checks her sugars 1-2 times a day: - am: 300s >> 95-134 >> 130-140, 182 (last night: soda) - 2h after b'fast: n/c - before lunch: n/c >> 111-120 - 2h after lunch: n/c - before dinner: n/c >> 110-120 - 2h after dinner: 300s >> 140-180 >> 99-160 - bedtime: n/c - nighttime: n/c Lowest sugar was 200 >> 95 >> 99. Highest sugar was 542 >> 200 >> 182.  Glucometer: ReliOn  Pt's meals are: - Breakfast: boiled egg + bacon + toast; bisquit ([employer]) - Lunch: salad, chicken salad + croissant - Dinner: may skip - Snacks: chips,  crackers  -+ CKD, last BUN/creatinine:  Lab Results  Component Value Date   BUN 18 05/20/2018   BUN 22 02/23/2018   CREATININE 1.34 (H) 05/20/2018   CREATININE 1.47 (H) 02/23/2018  On olmesartan 40.  -+ HL; last set of lipids: Lab Results  Component Value Date   CHOL 222 (H) 05/20/2018   HDL 37.10 (L) 05/20/2018   LDLDIRECT 120.0 05/20/2018   TRIG 381.0 (H) 05/20/2018   CHOLHDL 6 05/20/2018  On Lipitor 40.  - last eye exam was in 2019: No DR  - no numbness and tingling in her feet.  Pt has FH of DM in mother and sister.  ROS: Constitutional: no weight gain/+ weight loss, no fatigue, no subjective hyperthermia, no subjective hypothermia Eyes: no blurry vision, no xerophthalmia ENT: no sore throat, no nodules palpated in neck, no dysphagia, no odynophagia, no hoarseness Cardiovascular: no CP/no SOB/no palpitations/no leg swelling Respiratory: no cough/no SOB/no wheezing Gastrointestinal: no N/no V/no D/no C/no acid reflux Musculoskeletal: no muscle aches/no joint aches Skin: no rashes, no hair loss Neurological: no tremors/no numbness/no tingling/no dizziness  I reviewed pt's medications, allergies, PMH, social hx, family hx, and changes were documented in the history of present illness. Otherwise, unchanged from my initial visit note.   Past Medical History:  Diagnosis Date  . Anemia   . Anxiety   . Anxiety and depression   . Chronic  kidney disease   . Colon polyp   . Diabetes mellitus without complication ([place])   . Dysuria   . High risk sexual behavior   . History of syncope   . Hyperlipidemia   . Hypertension   . Internal hemorrhoids   . LVH (left ventricular hypertrophy)   . Malaise and fatigue   . Obesity (BMI 30-39.9)   . Ovarian mass   . Panic disorder   . PCOS (polycystic ovarian syndrome)   . Rectal bleeding   . Shortness of breath   . Surgical menopause   . Vaginal trichomoniasis    Past Surgical History:  Procedure Laterality Date  .  ABDOMINAL HYSTERECTOMY     partial  . APPENDECTOMY    . CHOLECYSTECTOMY    . TUBAL LIGATION     Social History   Socioeconomic History  . Marital status: Single    Spouse name: Not on file  . Number of children: 2  . Years of education: Not on file  . Highest education level: Not on file  Occupational History  . nurse  Social Needs  . Financial resource strain: Not on file  . Food insecurity:    Worry: Not on file    Inability: Not on file  . Transportation needs:    Medical: Not on file    Non-medical: Not on file  Tobacco Use  . Smoking status: Never Smoker  . Smokeless tobacco: Never Used  Substance and Sexual Activity  . Alcohol use:  Occasional 1 glass of wine  . Drug use: No   Current Outpatient Medications on File Prior to Visit  Medication Sig Dispense Refill  . amLODipine-olmesartan (AZOR) 10-40 MG tablet Take 1 tablet by mouth daily. 90 tablet 1  . aspirin EC 81 MG tablet Take 81 mg by mouth daily.    Marland Kitchen atorvastatin (LIPITOR) 40 MG tablet TAKE 1 TABLET BY MOUTH ONCE DAILY 90 tablet 1  . blood glucose meter kit and supplies KIT Dispense based on patient and insurance preference. Use up to four times daily as directed. 1 each 0  . cloNIDine (CATAPRES) 0.1 MG tablet Take 1 tablet (0.1 mg total) by mouth daily. 90 tablet 0  . escitalopram (LEXAPRO) 20 MG tablet Take 20 mg by mouth daily.    . metFORMIN (GLUCOPHAGE-XR) 500 MG 24 hr tablet Take 1 tablet (500 mg total) by mouth daily with breakfast. 180 tablet 0  . metoprolol succinate (TOPROL-XL) 100 MG 24 hr tablet TAKE 1 TABLET BY MOUTH ONCE DAILY. TAKE WITH OR IMMEDIATELY FOLLOWING A MEAL. 90 tablet 0  . Semaglutide,0.25 or 0.5MG/DOS, (OZEMPIC, 0.25 OR 0.5 MG/DOSE,) 2 MG/1.5ML SOPN Inject into the skin.    Marland Kitchen triamterene-hydrochlorothiazide (MAXZIDE-25) 37.5-25 MG tablet Take 1 tablet by mouth daily. 30 tablet 1   No current facility-administered medications on file prior to visit.    Also, Levemir, 15 units at  bedtime.  Allergies  Allergen Reactions  . Dilaudid [Hydromorphone Hcl] Nausea And Vomiting   Family History  Problem Relation Age of Onset  . Anxiety disorder Mother   . Hyperlipidemia Mother   . Depression Mother   . Hypertension Mother   . Diabetes Mother   . Thyroid disease Mother   . Hypertension Sister   . Diabetes Sister   . ALS Father   . Crohn's disease Brother   . Crohn's disease Sister    PE: Weight 180 lbs now There were no vitals taken for this visit. Wt Readings from Last  3 Encounters:  11/28/18 201 lb (91.2 kg)  08/04/18 206 lb (93.4 kg)  07/13/18 208 lb (94.3 kg)   Constitutional:  in NAD  The physical exam was not performed (phone visit).  ASSESSMENT: 1. DM2, insulin-dependent, uncontrolled, with long-term complications - CKD  2.  Hyperlipidemia  3.  Obesity class I  PLAN:  1. Patient with longstanding, uncontrolled, type 2 diabetes, previously on the long-acting insulin only, to which we added metformin and GLP-1 receptor agonist.  Her HbA1c improved from 12.2% to 8.3%. -We are currently using how maximal metformin dose due to her decreased kidney function -She is tolerating Ozempic better now, she initially had nausea and decreased appetite.  Since last visit, she lost almost 26 pounds!  She feels great. -At last visit, she was only taking Levemir approximately 3 times a week so I advised her to try to stay off of Levemir completely while following the sugars. -At this visit, sugars are better later in the day but they are still slightly above target in the morning.  She is taking the metformin only once a day due to diarrhea.  I advised her to hold the metformin at night, with dinner, to hopefully improve her morning sugars. -Unfortunately, she is relying for Ozempic samples.  We discussed that she needs to call her insurance to see if Ozempic is covered as we do not always have samples to give her. - I suggested to:  Patient Instructions  Please  continue: - Metformin ER 500 mg daily but move it at dinnertime. - Ozempic 0.5 mg weekly in a.m.   Please return in 3 months with your sugar log.   -We will check her HbA1c when she returns to the clinic - continue checking sugars at different times of the day - check 1x a day, rotating checks - advised for yearly eye exams >> she is UTD - Return to clinic in 3 mo with sugar log   2. HL - Reviewed latest lipid panel from 05/2018: LDL higher than goal, as were her triglycerides.  HDL low. Lab Results  Component Value Date   CHOL 222 (H) 05/20/2018   HDL 37.10 (L) 05/20/2018   LDLDIRECT 120.0 05/20/2018   TRIG 381.0 (H) 05/20/2018   CHOLHDL 6 05/20/2018  - Continues the statin without side effects.  3.  Obesity class I -She lost 10 pounds before last visit and almost 26 pounds since then.  She feels great. -Continue Ozempic which definitely helps her with weight loss  - time spent with the patient: 13 min, of which >50% was spent in obtaining information about her symptoms, reviewing her previous labs, evaluations, and treatments, counseling her about her conditions (please see the discussed topics above), and developing a plan to further investigate and treat it; she had a number of questions which I addressed.   Philemon Kingdom, MD PhD [facility]

## 2019-01-25 ENCOUNTER — Ambulatory Visit: Payer: BLUE CROSS/BLUE SHIELD | Admitting: Women's Health

## 2019-01-25 NOTE — Telephone Encounter (Signed)
Let's have her stay with Dr. Jenny Reichmann for right now since she has already established with him. We can re-evaluate in November when things will hopefully be calmer with Casey Acosta's patients.

## 2019-01-27 ENCOUNTER — Telehealth: Payer: Self-pay | Admitting: Family Medicine

## 2019-01-27 NOTE — Telephone Encounter (Signed)
Patient is requesting to transfer care from Dr Jenny Reichmann at [facility] to Dr Volanda Napoleon at [facility]. (See message below).  Dr. Jenny Reichmann, is this okay with you? Dr. Volanda Napoleon, is this okay with you?

## 2019-01-27 NOTE — Telephone Encounter (Signed)
Copied from [place] [id number]. Topic: Appointment Scheduling - Transfer of Care >> Jan 27, 2019  8:57 AM Sheran Luz wrote: Pt is requesting to transfer FROM: Dr. Jenny Reichmann  Pt is requesting to transfer TO: Dr. Volanda Napoleon ([place])  Reason for requested transfer: Patient would like a female provider.

## 2019-01-27 NOTE — Telephone Encounter (Signed)
Ok with me 

## 2019-01-27 NOTE — Telephone Encounter (Signed)
LVM  Informing patient

## 2019-01-31 NOTE — Telephone Encounter (Signed)
ok 

## 2019-02-06 ENCOUNTER — Other Ambulatory Visit: Payer: Self-pay | Admitting: *Deleted

## 2019-02-06 MED ORDER — ESCITALOPRAM OXALATE 20 MG PO TABS: 20.0000 mg | ORAL_TABLET | Freq: Every day | ORAL | 3 refills | Status: DC

## 2019-02-09 NOTE — Telephone Encounter (Signed)
Can you help to schedule transfer of care visit with Dr Volanda Napoleon? Thank you!

## 2019-02-10 NOTE — Telephone Encounter (Signed)
lmom for pt to call the office to get TOC appt schedule

## 2019-02-16 ENCOUNTER — Other Ambulatory Visit: Payer: Self-pay

## 2019-02-16 ENCOUNTER — Ambulatory Visit (INDEPENDENT_AMBULATORY_CARE_PROVIDER_SITE_OTHER): Payer: PRIVATE HEALTH INSURANCE | Admitting: Family Medicine

## 2019-02-16 ENCOUNTER — Encounter: Payer: Self-pay | Admitting: Family Medicine

## 2019-02-16 DIAGNOSIS — F32A Depression, unspecified: Secondary | ICD-10-CM

## 2019-02-16 DIAGNOSIS — E1169 Type 2 diabetes mellitus with other specified complication: Secondary | ICD-10-CM | POA: Diagnosis not present

## 2019-02-16 DIAGNOSIS — F419 Anxiety disorder, unspecified: Secondary | ICD-10-CM | POA: Diagnosis not present

## 2019-02-16 DIAGNOSIS — E782 Mixed hyperlipidemia: Secondary | ICD-10-CM | POA: Diagnosis not present

## 2019-02-16 DIAGNOSIS — I10 Essential (primary) hypertension: Secondary | ICD-10-CM | POA: Diagnosis not present

## 2019-02-16 DIAGNOSIS — F329 Major depressive disorder, single episode, unspecified: Secondary | ICD-10-CM

## 2019-02-16 NOTE — Progress Notes (Signed)
Virtual Visit via Video Note  I connected with Casey Acosta on 02/16/19 at  9:30 AM EDT by a video enabled telemedicine application and verified that I am speaking with the correct person using two identifiers.  Location patient: home Location provider:work or home office Persons participating in the virtual visit: patient, provider  I discussed the limitations of evaluation and management by telemedicine and the availability of in person appointments. The patient expressed understanding and agreed to proceed.   HPI: Pt seen for f/u and TOC, previously seen by Dr. Jenny Reichmann.  Pt states she contracted COVID-19 at work in May.  Feeling better now.    HTN:  Pt not taking clonidine consistently.  May take 3/7 days per wk.  Taking amlodipine-olmesartan 10-40 mg daily, toprol xl 100 mg daily, and triamterene-hctz 37.5-25 mg daily.  Checking bp at home with a wrist curr.  BP was 209/150s, 191/150.  Endorses occasional HAs.  Pt was referred to Nephro, but appt was cancelled 2/2 COVID-19 pandemic.  Pt hesitant to not see a specialist in person.  DM II: followed by Dr. Cruzita Lederer. Pt taking ozempic which decreases her appetite.  Baking foods.  Pt losing wt, was 236 lbs now 180 lbs.  Snacks on cheese and  crackers, peanut butter crackers.  Fsbs 128 this am. Was 187 last night.  HLD: taking lipitor 40 mg.  Has a tiredness in her shoulder muscles.  Started recently.  Feels bad.  Anxiety and depression:  Taking lexapro 20 mg.  Sleep could be better. Pt tossing and turning.  Had sleep issues for a while.  Tried melatonin without success.  Mood is ok.  Energy is ok.  Has considered counseling.  ROS: See pertinent positives and negatives per HPI.  Past Medical History:  Diagnosis Date  . Anemia   . Anxiety   . Anxiety and depression   . Chronic kidney disease   . Colon polyp   . Diabetes mellitus without complication ([place])   . Dysuria   . High risk sexual behavior   . History of syncope   .  Hyperlipidemia   . Hypertension   . Internal hemorrhoids   . LVH (left ventricular hypertrophy)   . Malaise and fatigue   . Obesity (BMI 30-39.9)   . Ovarian mass   . Panic disorder   . PCOS (polycystic ovarian syndrome)   . Rectal bleeding   . Shortness of breath   . Surgical menopause   . Vaginal trichomoniasis     Past Surgical History:  Procedure Laterality Date  . ABDOMINAL HYSTERECTOMY     partial  . APPENDECTOMY    . CHOLECYSTECTOMY    . TUBAL LIGATION      Family History  Problem Relation Age of Onset  . Anxiety disorder Mother   . Hyperlipidemia Mother   . Depression Mother   . Hypertension Mother   . Diabetes Mother   . Thyroid disease Mother   . Hypertension Sister   . Diabetes Sister   . ALS Father   . Crohn's disease Brother   . Crohn's disease Sister    SOCIAL HX: Works at [employer].   Current Outpatient Medications:  .  amLODipine-olmesartan (AZOR) 10-40 MG tablet, Take 1 tablet by mouth daily., Disp: 90 tablet, Rfl: 1 .  aspirin EC 81 MG tablet, Take 81 mg by mouth daily., Disp: , Rfl:  .  atorvastatin (LIPITOR) 40 MG tablet, TAKE 1 TABLET BY MOUTH ONCE DAILY, Disp: 90 tablet, Rfl:  1 .  blood glucose meter kit and supplies KIT, Dispense based on patient and insurance preference. Use up to four times daily as directed., Disp: 1 each, Rfl: 0 .  cloNIDine (CATAPRES) 0.1 MG tablet, Take 1 tablet (0.1 mg total) by mouth daily., Disp: 90 tablet, Rfl: 0 .  escitalopram (LEXAPRO) 20 MG tablet, Take 1 tablet (20 mg total) by mouth daily., Disp: 30 tablet, Rfl: 3 .  metFORMIN (GLUCOPHAGE-XR) 500 MG 24 hr tablet, Take 1 tablet (500 mg total) by mouth daily with breakfast., Disp: 90 tablet, Rfl: 3 .  metoprolol succinate (TOPROL-XL) 100 MG 24 hr tablet, TAKE 1 TABLET BY MOUTH ONCE DAILY. TAKE WITH OR IMMEDIATELY FOLLOWING A MEAL., Disp: 90 tablet, Rfl: 0 .  Semaglutide,0.25 or 0.5MG/DOS, (OZEMPIC, 0.25 OR 0.5 MG/DOSE,) 2 MG/1.5ML SOPN, Inject into the  skin., Disp: , Rfl:  .  triamterene-hydrochlorothiazide (MAXZIDE-25) 37.5-25 MG tablet, Take 1 tablet by mouth daily., Disp: 30 tablet, Rfl: 1  EXAM:  VITALS per patient if applicable:  RR between 12-20 bpm  GENERAL: alert, oriented, appears well and in no acute distress  HEENT: atraumatic, conjunctiva clear, no obvious abnormalities on inspection of external nose and ears  NECK: normal movements of the head and neck  LUNGS: on inspection no signs of respiratory distress, breathing rate appears normal, no obvious gross SOB, gasping or wheezing  CV: no obvious cyanosis  MS: moves all visible extremities without noticeable abnormality  PSYCH/NEURO: pleasant and cooperative, no obvious depression or anxiety, speech and thought processing grossly intact  ASSESSMENT AND PLAN:  Discussed the following assessment and plan:  Anxiety and depression -continue lexapro  -pt advised to consider counseling.  Pt open to the idea. -given info on area [facility] providers.  Mixed hyperlipidemia  -continue lipitor 40 mg for now.   -will discuss other medication options given concern for myalgias. -last lipid panel 05/20/18.  Total chol 222, HLD 37.1, Triglycerides 381 -repeat lipid panel   Type 2 diabetes mellitus with other specified complication, without long-term current use of insulin ([place])  -last hgb A1C 8.3% on 08/04/18 -lifestyle modifications encouraged -continue ozempic and metformin xr 500 mg daily -continue f/u with Endocrinology.  Essential hypertension  -uncontrolled. -last BMP 05/20/18 -will d/c clonidine at this time 2/2 inconsistent use and concern for rebound hypertension -continue amlodipine-olmesartan 10-40 mg, toprol XL 100 mg daily, and triamterene-HCTZ 37.5-25 mg  -pt strongly advised to reschedule Nephrology appointment immediately. -consider renal u/s  F/u in 1-2 wks for bp   I discussed the assessment and treatment plan with the patient. The patient was provided an  opportunity to ask questions and all were answered. The patient agreed with the plan and demonstrated an understanding of the instructions.   The patient was advised to call back or seek an in-person evaluation if the symptoms worsen or if the condition fails to improve as anticipated.   Billie Ruddy, MD

## 2019-03-06 ENCOUNTER — Encounter: Payer: Self-pay | Admitting: Internal Medicine

## 2019-03-09 ENCOUNTER — Telehealth: Payer: Self-pay | Admitting: *Deleted

## 2019-03-09 MED ORDER — METFORMIN HCL 500 MG PO TABS: 500.0000 mg | ORAL_TABLET | Freq: Every day | ORAL | 3 refills | Status: DC

## 2019-03-09 NOTE — Telephone Encounter (Signed)
Patient informed of message below.

## 2019-03-09 NOTE — Telephone Encounter (Signed)
Received fax from pharmacy re: Metformin ER recall. Need new rx. Please advise.

## 2019-03-09 NOTE — Telephone Encounter (Signed)
Thanks for your inquiry about the concern related to a possible cancer causing contaminant to the metformin ER.   We will simply need to change your metformin ER to the regular metformin as there has been no concern about this with the regular metformin.  We will send a new prescription, and simply start this when you are able instead.

## 2019-03-09 NOTE — Telephone Encounter (Signed)
Left message for patient to call back. CRM created 

## 2019-03-17 ENCOUNTER — Other Ambulatory Visit: Payer: Self-pay

## 2019-03-20 ENCOUNTER — Ambulatory Visit: Payer: Self-pay | Admitting: Women's Health

## 2019-03-20 DIAGNOSIS — Z0289 Encounter for other administrative examinations: Secondary | ICD-10-CM

## 2019-04-28 ENCOUNTER — Encounter: Payer: Self-pay | Admitting: Internal Medicine

## 2019-05-01 ENCOUNTER — Encounter: Payer: Self-pay | Admitting: Internal Medicine

## 2019-05-18 ENCOUNTER — Encounter: Payer: Self-pay | Admitting: Internal Medicine

## 2019-05-19 ENCOUNTER — Ambulatory Visit: Payer: PRIVATE HEALTH INSURANCE | Admitting: Internal Medicine

## 2019-06-29 ENCOUNTER — Encounter: Payer: Self-pay | Admitting: Family Medicine

## 2019-06-30 ENCOUNTER — Encounter: Payer: Self-pay | Admitting: Internal Medicine

## 2019-06-30 ENCOUNTER — Other Ambulatory Visit: Payer: Self-pay

## 2019-06-30 DIAGNOSIS — I10 Essential (primary) hypertension: Secondary | ICD-10-CM

## 2019-06-30 MED ORDER — CLONIDINE HCL 0.1 MG PO TABS: 0.1000 mg | ORAL_TABLET | Freq: Every day | ORAL | 0 refills | Status: DC

## 2019-06-30 MED ORDER — METOPROLOL SUCCINATE ER 100 MG PO TB24: ORAL_TABLET | ORAL | 0 refills | Status: DC

## 2019-07-05 ENCOUNTER — Ambulatory Visit: Payer: PRIVATE HEALTH INSURANCE | Admitting: Internal Medicine

## 2019-08-07 ENCOUNTER — Encounter: Payer: Self-pay | Admitting: Family Medicine

## 2019-08-09 ENCOUNTER — Other Ambulatory Visit: Payer: Self-pay

## 2019-08-09 DIAGNOSIS — I10 Essential (primary) hypertension: Secondary | ICD-10-CM

## 2019-08-09 MED ORDER — TRIAMTERENE-HCTZ 37.5-25 MG PO TABS: 1.0000 | ORAL_TABLET | Freq: Every day | ORAL | 1 refills | Status: DC

## 2019-08-09 MED ORDER — AMLODIPINE-OLMESARTAN 10-40 MG PO TABS: 1.0000 | ORAL_TABLET | Freq: Every day | ORAL | 1 refills | Status: DC

## 2019-08-09 NOTE — Telephone Encounter (Signed)
Rx sent to pt pharmacy 

## 2019-08-31 ENCOUNTER — Other Ambulatory Visit: Payer: Self-pay

## 2019-08-31 ENCOUNTER — Telehealth: Payer: Self-pay | Admitting: *Deleted

## 2019-08-31 DIAGNOSIS — I10 Essential (primary) hypertension: Secondary | ICD-10-CM

## 2019-08-31 MED ORDER — METOPROLOL SUCCINATE ER 100 MG PO TB24: ORAL_TABLET | ORAL | 0 refills | Status: DC

## 2019-08-31 NOTE — Telephone Encounter (Signed)
Spoke with pt states that she is low on her Blood pressure is medication since she has been taking one extra tablet. Pt state that her BP has been elevated for the past few months. Pt states that last night she checked her BP it was 208/138, pt states that she has been calling her pharmacy to refill her medication but they have not contacted notifying her to pick up Rx. Pt denies dizziness, blurry vision but states that she has been experiencing headaches at the back of her head. Pt was advised to call 911 or have someone drive her to the Emergency Room,Pt state that her boyfriend is on his way and that she will let him know that Dr Volanda Napoleon advises her to go to the ED immediately. Pt verbalized understanding and agreed with Dr Volanda Napoleon instructions /recommendations

## 2019-08-31 NOTE — Telephone Encounter (Signed)
Copied from [place] [id number]. Topic: General - Other >> Aug 31, 2019 10:05 AM Yvette Rack wrote: Reason for CRM: Pt stated she was returning call to [place]. Pt requests call back

## 2019-09-04 ENCOUNTER — Telehealth: Payer: Self-pay

## 2019-09-04 ENCOUNTER — Ambulatory Visit: Payer: PRIVATE HEALTH INSURANCE | Admitting: Internal Medicine

## 2019-09-04 ENCOUNTER — Other Ambulatory Visit: Payer: Self-pay

## 2019-09-04 DIAGNOSIS — I10 Essential (primary) hypertension: Secondary | ICD-10-CM

## 2019-09-04 MED ORDER — CLONIDINE HCL 0.1 MG PO TABS: 0.1000 mg | ORAL_TABLET | Freq: Every day | ORAL | 0 refills | Status: DC

## 2019-09-04 NOTE — Telephone Encounter (Signed)
Called to check on if pt went to the ED for elevated BP on 08/31/2019 as advised, pt stated that she went to ED but the wait time was long and she decided to go back home, pt states that she picked up her BP medications from her pharmacy and has been taking them, pt denied any headaches, dizziness, blurry vision, states that she was not experiencing any elevated BP symptoms. Pt also state that she checked her BP this morning and it was not as high as previous provided a reading of 158/98, Pt was advised to schedule appointment with Dr Volanda Napoleon 09/12/2018 at 1.30 pm. Pt was also advised to seek medical attention if her BP gets worse or has any symptoms. Pt verbalized understanding

## 2019-09-05 ENCOUNTER — Encounter: Payer: Self-pay | Admitting: Internal Medicine

## 2019-09-05 ENCOUNTER — Ambulatory Visit: Payer: PRIVATE HEALTH INSURANCE | Admitting: Internal Medicine

## 2019-09-05 NOTE — Telephone Encounter (Signed)
Please advise as what to do

## 2019-09-11 NOTE — Progress Notes (Addendum)
Patient ID: Casey Acosta, female   DOB: Sep 10, 1970, 49 y.o.   MRN: [id number]   Patient location: Home My location: Office  Referring Provider: Billie Ruddy, MD  I connected with the patient on 09/11/19 at  4:34 PM EDT by phone application and verified that I am speaking with the correct person.   I discussed the limitations of evaluation and management by phone and the availability of in person appointments. The patient expressed understanding and agreed to proceed.   Details of the encounter are shown below.  HPI: Casey Acosta is a 49 y.o.-year-old female, initially referred by her PCP, Dr. Glendon Axe, returning for follow-up for DM2, dx in 2010, insulin-dependent, uncontrolled, with complications (CKD).  Last visit 5 months ago.  Last hemoglobin A1c was: Lab Results  Component Value Date   HGBA1C 8.3 (A) 08/04/2018   HGBA1C 12.2 (A) 04/22/2018   HGBA1C 14.9 (H) 02/24/2018   At last visit, she was on: - Levemir 10 >> 15 units at bedtime -started 02/24/2018 She has been Metformin >> tolerated this well, but stopped 2/2 CKD. Had diarrhea with the regular formulation  She tried [place] >> tolerated this well, but not covered by her insurance.  She is now on: - Metformin ER 500 mg 2x a day with meals  - Ozempic 0.5 mg weekly in a.m.  - samples At last visit, she was on Levemir 25 units daily but only taking it 3 times a week, so we stopped.  Pt checks her sugars 1-2 times a day: - am: 300s >> 95-134 >> 130-140, 182 (last night: soda) - 2h after b'fast: n/c - before lunch: n/c >> 111-120 - 2h after lunch: n/c - before dinner: n/c >> 110-120 - 2h after dinner: 300s >> 140-180 >> 99-160 - bedtime: n/c - nighttime: n/c Lowest sugar was 200 >> 95 >> 99. Highest sugar was 542 >> 200 >> 182.  Glucometer: ReliOn  Pt's meals are: - Breakfast: boiled egg + bacon + toast; bisquit ([employer]) - Lunch: salad, chicken salad + croissant - Dinner: may skip - Snacks: chips,  crackers  -+ CKD, last BUN/creatinine:  Lab Results  Component Value Date   BUN 18 05/20/2018   BUN 22 02/23/2018   CREATININE 1.34 (H) 05/20/2018   CREATININE 1.47 (H) 02/23/2018  On olmesartan 40.  -+ HL; last set of lipids: Lab Results  Component Value Date   CHOL 222 (H) 05/20/2018   HDL 37.10 (L) 05/20/2018   LDLDIRECT 120.0 05/20/2018   TRIG 381.0 (H) 05/20/2018   CHOLHDL 6 05/20/2018  On Lipitor 40.  - last eye exam was in 2019: No DR  - no numbness and tingling in her feet.  Pt has FH of DM in mother and sister.  ROS: Constitutional: no weight gain/+ weight loss, no fatigue, no subjective hyperthermia, no subjective hypothermia Eyes: no blurry vision, no xerophthalmia ENT: no sore throat, no nodules palpated in neck, no dysphagia, no odynophagia, no hoarseness Cardiovascular: no CP/no SOB/no palpitations/no leg swelling Respiratory: no cough/no SOB/no wheezing Gastrointestinal: no N/no V/no D/no C/no acid reflux Musculoskeletal: no muscle aches/no joint aches Skin: no rashes, no hair loss Neurological: no tremors/no numbness/no tingling/no dizziness  I reviewed pt's medications, allergies, PMH, social hx, family hx, and changes were documented in the history of present illness. Otherwise, unchanged from my initial visit note.   Past Medical History:  Diagnosis Date  . Anemia   . Anxiety   . Anxiety and depression   . Chronic  kidney disease   . Colon polyp   . Diabetes mellitus without complication ([place])   . Dysuria   . High risk sexual behavior   . History of syncope   . Hyperlipidemia   . Hypertension   . Internal hemorrhoids   . LVH (left ventricular hypertrophy)   . Malaise and fatigue   . Obesity (BMI 30-39.9)   . Ovarian mass   . Panic disorder   . PCOS (polycystic ovarian syndrome)   . Rectal bleeding   . Shortness of breath   . Surgical menopause   . Vaginal trichomoniasis    Past Surgical History:  Procedure Laterality Date  .  ABDOMINAL HYSTERECTOMY     partial  . APPENDECTOMY    . CHOLECYSTECTOMY    . TUBAL LIGATION     Social History   Socioeconomic History  . Marital status: Single    Spouse name: Not on file  . Number of children: 2  . Years of education: Not on file  . Highest education level: Not on file  Occupational History  . nurse  Social Needs  . Financial resource strain: Not on file  . Food insecurity:    Worry: Not on file    Inability: Not on file  . Transportation needs:    Medical: Not on file    Non-medical: Not on file  Tobacco Use  . Smoking status: Never Smoker  . Smokeless tobacco: Never Used  Substance and Sexual Activity  . Alcohol use:  Occasional 1 glass of wine  . Drug use: No   Current Outpatient Medications on File Prior to Visit  Medication Sig Dispense Refill  . amLODipine-olmesartan (AZOR) 10-40 MG tablet Take 1 tablet by mouth daily. 30 tablet 1  . aspirin EC 81 MG tablet Take 81 mg by mouth daily.    Marland Kitchen atorvastatin (LIPITOR) 40 MG tablet TAKE 1 TABLET BY MOUTH ONCE DAILY 90 tablet 1  . blood glucose meter kit and supplies KIT Dispense based on patient and insurance preference. Use up to four times daily as directed. 1 each 0  . cloNIDine (CATAPRES) 0.1 MG tablet Take 1 tablet (0.1 mg total) by mouth daily. 90 tablet 0  . escitalopram (LEXAPRO) 20 MG tablet Take 1 tablet (20 mg total) by mouth daily. 30 tablet 3  . metFORMIN (GLUCOPHAGE) 500 MG tablet Take 1 tablet (500 mg total) by mouth daily with breakfast. 90 tablet 3  . metoprolol succinate (TOPROL-XL) 100 MG 24 hr tablet TAKE 1 TABLET BY MOUTH ONCE DAILY. TAKE WITH OR IMMEDIATELY FOLLOWING A MEAL. 90 tablet 0  . Semaglutide,0.25 or 0.'5MG'$ /DOS, (OZEMPIC, 0.25 OR 0.5 MG/DOSE,) 2 MG/1.5ML SOPN Inject into the skin.    Marland Kitchen triamterene-hydrochlorothiazide (MAXZIDE-25) 37.5-25 MG tablet Take 1 tablet by mouth daily. 30 tablet 1   No current facility-administered medications on file prior to visit.   Also, Levemir,  15 units at bedtime.  Allergies  Allergen Reactions  . Dilaudid [Hydromorphone Hcl] Nausea And Vomiting   Family History  Problem Relation Age of Onset  . Anxiety disorder Mother   . Hyperlipidemia Mother   . Depression Mother   . Hypertension Mother   . Diabetes Mother   . Thyroid disease Mother   . Hypertension Sister   . Diabetes Sister   . ALS Father   . Crohn's disease Brother   . Crohn's disease Sister    PE: Weight 180 lbs now There were no vitals taken for this visit. Wt Readings from  Last 3 Encounters:  11/28/18 201 lb (91.2 kg)  08/04/18 206 lb (93.4 kg)  07/13/18 208 lb (94.3 kg)   Constitutional:  in NAD  The physical exam was not performed (phone visit).  ASSESSMENT: 1. DM2, insulin-dependent, uncontrolled, with long-term complications - CKD  2.  Hyperlipidemia  3.  Obesity class I  PLAN:  1. Patient with longstanding, uncontrolled, type 2 diabetes, previously on the long-acting insulin only, to which we added metformin and GLP-1 receptor agonist.  Her HbA1c improved from 12.2% to 8.3%. -We are currently using how maximal metformin dose due to her decreased kidney function -She is tolerating Ozempic better now, she initially had nausea and decreased appetite.  Since last visit, she lost almost 26 pounds!  She feels great. -At last visit, she was only taking Levemir approximately 3 times a week so I advised her to try to stay off of Levemir completely while following the sugars. -At this visit, sugars are better later in the day but they are still slightly above target in the morning.  She is taking the metformin only once a day due to diarrhea.  I advised her to hold the metformin at night, with dinner, to hopefully improve her morning sugars. -Unfortunately, she is relying for Ozempic samples.  We discussed that she needs to call her insurance to see if Ozempic is covered as we do not always have samples to give her. - I suggested to:  Patient  Instructions  Please continue: - Metformin ER 500 mg daily but move it at dinnertime. - Ozempic 0.5 mg weekly in a.m.   Please return in 3 months with your sugar log.   -We will check her HbA1c when she returns to the clinic - continue checking sugars at different times of the day - check 1x a day, rotating checks - advised for yearly eye exams >> she is UTD - Return to clinic in 3 mo with sugar log   2. HL - Reviewed latest lipid panel from 05/2018: LDL higher than goal, as were her triglycerides.  HDL low. Lab Results  Component Value Date   CHOL 222 (H) 05/20/2018   HDL 37.10 (L) 05/20/2018   LDLDIRECT 120.0 05/20/2018   TRIG 381.0 (H) 05/20/2018   CHOLHDL 6 05/20/2018  - Continues the statin without side effects.  3.  Obesity class I -She lost 10 pounds before last visit and almost 26 pounds since then.  She feels great. -Continue Ozempic which definitely helps her with weight loss  - time spent with the patient: 15 min, of which >50% was spent in obtaining information about her symptoms, reviewing her previous labs, evaluations, and treatments, counseling her about her conditions (please see the discussed topics above), and developing a plan to further investigate and treat them; she had a number of questions which I addressed.  Philemon Kingdom, MD PhD [facility] Endocrinology

## 2019-09-12 ENCOUNTER — Other Ambulatory Visit: Payer: Self-pay

## 2019-09-12 ENCOUNTER — Ambulatory Visit (INDEPENDENT_AMBULATORY_CARE_PROVIDER_SITE_OTHER): Payer: PRIVATE HEALTH INSURANCE | Admitting: Internal Medicine

## 2019-09-12 DIAGNOSIS — E1121 Type 2 diabetes mellitus with diabetic nephropathy: Secondary | ICD-10-CM

## 2019-09-12 DIAGNOSIS — E785 Hyperlipidemia, unspecified: Secondary | ICD-10-CM | POA: Diagnosis not present

## 2019-09-12 DIAGNOSIS — E669 Obesity, unspecified: Secondary | ICD-10-CM | POA: Diagnosis not present

## 2019-09-13 ENCOUNTER — Ambulatory Visit: Payer: PRIVATE HEALTH INSURANCE | Admitting: Family Medicine

## 2019-09-24 ENCOUNTER — Encounter: Payer: Self-pay | Admitting: Internal Medicine

## 2019-09-25 ENCOUNTER — Encounter: Payer: Self-pay | Admitting: Internal Medicine

## 2019-09-26 NOTE — Addendum Note (Signed)
Addended by: Philemon Kingdom on: 09/26/2019 01:02 PM   Modules accepted: Level of Service

## 2019-10-02 ENCOUNTER — Other Ambulatory Visit: Payer: Self-pay

## 2019-10-02 MED ORDER — OZEMPIC (0.25 OR 0.5 MG/DOSE) 2 MG/1.5ML ~~LOC~~ SOPN: 0.5000 mg | PEN_INJECTOR | SUBCUTANEOUS | 0 refills | Status: DC

## 2019-10-26 ENCOUNTER — Encounter: Payer: Self-pay | Admitting: Family Medicine

## 2019-10-26 ENCOUNTER — Other Ambulatory Visit: Payer: Self-pay

## 2019-10-26 ENCOUNTER — Ambulatory Visit (INDEPENDENT_AMBULATORY_CARE_PROVIDER_SITE_OTHER): Payer: 59 | Admitting: Family Medicine

## 2019-10-26 VITALS — BP 132/80 | HR 90 | Temp 97.9°F | Ht 66.0 in | Wt 188.0 lb

## 2019-10-26 DIAGNOSIS — E119 Type 2 diabetes mellitus without complications: Secondary | ICD-10-CM

## 2019-10-26 DIAGNOSIS — F419 Anxiety disorder, unspecified: Secondary | ICD-10-CM

## 2019-10-26 DIAGNOSIS — I10 Essential (primary) hypertension: Secondary | ICD-10-CM | POA: Diagnosis not present

## 2019-10-26 DIAGNOSIS — F329 Major depressive disorder, single episode, unspecified: Secondary | ICD-10-CM

## 2019-10-26 DIAGNOSIS — E782 Mixed hyperlipidemia: Secondary | ICD-10-CM

## 2019-10-26 LAB — BASIC METABOLIC PANEL
BUN: 20 mg/dL (ref 6–23)
CO2: 29 mEq/L (ref 19–32)
Calcium: 10.3 mg/dL (ref 8.4–10.5)
Chloride: 101 mEq/L (ref 96–112)
Creatinine, Ser: 1.98 mg/dL — ABNORMAL HIGH (ref 0.40–1.20)
GFR: 32.24 mL/min — ABNORMAL LOW (ref >60.00)
Glucose, Bld: 185 mg/dL — ABNORMAL HIGH (ref 70–99)
Potassium: 4.5 mEq/L (ref 3.5–5.1)
Sodium: 138 mEq/L (ref 135–145)

## 2019-10-26 LAB — CBC
HCT: 36.4 % (ref 36.0–46.0)
Hemoglobin: 11.8 g/dL — ABNORMAL LOW (ref 12.0–15.0)
MCHC: 32.4 g/dL (ref 30.0–36.0)
MCV: 81.8 fl (ref 78.0–100.0)
Platelets: 360 10*3/uL (ref 150.0–400.0)
RBC: 4.45 Mil/uL (ref 3.87–5.11)
RDW: 16.4 % — ABNORMAL HIGH (ref 11.5–15.5)
WBC: 11.7 10*3/uL — ABNORMAL HIGH (ref 4.0–10.5)

## 2019-10-26 LAB — LIPID PANEL
Cholesterol: 289 mg/dL — ABNORMAL HIGH (ref 0–200)
HDL: 45.7 mg/dL (ref >39.00)
Total CHOL/HDL Ratio: 6
Triglycerides: 404 mg/dL — ABNORMAL HIGH (ref 0.0–149.0)

## 2019-10-26 LAB — TSH: TSH: 0.79 u[IU]/mL (ref 0.35–4.50)

## 2019-10-26 LAB — T4, FREE: Free T4: 0.76 ng/dL (ref 0.60–1.60)

## 2019-10-26 LAB — LDL CHOLESTEROL, DIRECT: Direct LDL: 167 mg/dL

## 2019-10-26 MED ORDER — HYDROXYZINE HCL 50 MG PO TABS: 50.0000 mg | ORAL_TABLET | Freq: Three times a day (TID) | ORAL | 1 refills | Status: DC | PRN

## 2019-10-26 MED ORDER — BUPROPION HCL ER (SR) 150 MG PO TB12: 150.0000 mg | ORAL_TABLET | Freq: Two times a day (BID) | ORAL | 1 refills | Status: DC

## 2019-10-26 NOTE — Patient Instructions (Addendum)
Living With Depression Everyone experiences occasional disappointment, sadness, and loss in their lives. When you are feeling down, blue, or sad for at least 2 weeks in a row, it may mean that you have depression. Depression can affect your thoughts and feelings, relationships, daily activities, and physical health. It is caused by changes in the way your brain functions. If you receive a diagnosis of depression, your health care provider will tell you which type of depression you have and what treatment options are available to you. If you are living with depression, there are ways to help you recover from it and also ways to prevent it from coming back. How to cope with lifestyle changes Coping with stress     Stress is your body's reaction to life changes and events, both good and bad. Stressful situations may include:  Getting married.  The death of a spouse.  Losing a job.  Retiring.  Having a baby. Stress can last just a few hours or it can be ongoing. Stress can play a major role in depression, so it is important to learn both how to cope with stress and how to think about it differently. Talk with your health care provider or a counselor if you would like to learn more about stress reduction. He or she may suggest some stress reduction techniques, such as:  Music therapy. This can include creating music or listening to music. Choose music that you enjoy and that inspires you.  Mindfulness-based meditation. This kind of meditation can be done while sitting or walking. It involves being aware of your normal breaths, rather than trying to control your breathing.  Centering prayer. This is a kind of meditation that involves focusing on a spiritual word or phrase. Choose a word, phrase, or sacred image that is meaningful to you and that brings you peace.  Deep breathing. To do this, expand your stomach and inhale slowly through your nose. Hold your breath for 3-5 seconds, then  exhale slowly, allowing your stomach muscles to relax.  Muscle relaxation. This involves intentionally tensing muscles then relaxing them. Choose a stress reduction technique that fits your lifestyle and personality. Stress reduction techniques take time and practice to develop. Set aside 5-15 minutes a day to do them. Therapists can offer training in these techniques. The training may be covered by some insurance plans. Other things you can do to manage stress include:  Keeping a stress diary. This can help you learn what triggers your stress and ways to control your response.  Understanding what your limits are and saying no to requests or events that lead to a schedule that is too full.  Thinking about how you respond to certain situations. You may not be able to control everything, but you can control how you react.  Adding humor to your life by watching funny films or TV shows.  Making time for activities that help you relax and not feeling guilty about spending your time this way.  Medicines Your health care provider may suggest certain medicines if he or she feels that they will help improve your condition. Avoid using alcohol and other substances that may prevent your medicines from working properly (may interact). It is also important to:  Talk with your pharmacist or health care provider about all the medicines that you take, their possible side effects, and what medicines are safe to take together.  Make it your goal to take part in all treatment decisions (shared decision-making). This includes giving input  on the side effects of medicines. It is best if shared decision-making with your health care provider is part of your total treatment plan. If your health care provider prescribes a medicine, you may not notice the full benefits of it for 4-8 weeks. Most people who are treated for depression need to be on medicine for at least 6-12 months after they feel better. If you are taking  medicines as part of your treatment, do not stop taking medicines without first talking to your health care provider. You may need to have the medicine slowly decreased (tapered) over time to decrease the risk of harmful side effects. Relationships Your health care provider may suggest family therapy along with individual therapy and drug therapy. While there may not be family problems that are causing you to feel depressed, it is still important to make sure your family learns as much as they can about your mental health. Having your family's support can help make your treatment successful. How to recognize changes in your condition Everyone has a different response to treatment for depression. Recovery from major depression happens when you have not had signs of major depression for two months. This may mean that you will start to:  Have more interest in doing activities.  Feel less hopeless than you did 2 months ago.  Have more energy.  Overeat less often, or have better or improving appetite.  Have better concentration. Your health care provider will work with you to decide the next steps in your recovery. It is also important to recognize when your condition is getting worse. Watch for these signs:  Having fatigue or low energy.  Eating too much or too little.  Sleeping too much or too little.  Feeling restless, agitated, or hopeless.  Having trouble concentrating or making decisions.  Having unexplained physical complaints.  Feeling irritable, angry, or aggressive. Get help as soon as you or your family members notice these symptoms coming back. How to get support and help from others How to talk with friends and family members about your condition  Talking to friends and family members about your condition can provide you with one way to get support and guidance. Reach out to trusted friends or family members, explain your symptoms to them, and let them know that you are  working with a health care provider to treat your depression. Financial resources Not all insurance plans cover mental health care, so it is important to check with your insurance carrier. If paying for co-pays or counseling services is a problem, search for a local or county mental health care center. They may be able to offer public mental health care services at low or no cost when you are not able to see a private health care provider. If you are taking medicine for depression, you may be able to get the generic form, which may be less expensive. Some makers of prescription medicines also offer help to patients who cannot afford the medicines they need. Follow these instructions at home:   Get the right amount and quality of sleep.  Cut down on using caffeine, tobacco, alcohol, and other potentially harmful substances.  Try to exercise, such as walking or lifting small weights.  Take over-the-counter and prescription medicines only as told by your health care provider.  Eat a healthy diet that includes plenty of vegetables, fruits, whole grains, low-fat dairy products, and lean protein. Do not eat a lot of foods that are high in solid fats, added sugars, or  salt.  Keep all follow-up visits as told by your health care provider. This is important. Contact a health care provider if:  You stop taking your antidepressant medicines, and you have any of these symptoms: ? Nausea. ? Headache. ? Feeling lightheaded. ? Chills and body aches. ? Not being able to sleep (insomnia).  You or your friends and family think your depression is getting worse. Get help right away if:  You have thoughts of hurting yourself or others. If you ever feel like you may hurt yourself or others, or have thoughts about taking your own life, get help right away. You can go to your nearest emergency department or call:  Your local emergency services (911 in the [place]  A suicide crisis helpline, such as the  [facility] at 4031890952. This is open 24-hours a day. Summary  If you are living with depression, there are ways to help you recover from it and also ways to prevent it from coming back.  Work with your health care team to create a management plan that includes counseling, stress management techniques, and healthy lifestyle habits. This information is not intended to replace advice given to you by your health care provider. Make sure you discuss any questions you have with your health care provider. Document Revised: 12/09/2018 Document Reviewed: 07/20/2016 Elsevier Patient Education  [place], Adult After being diagnosed with an anxiety disorder, you may be relieved to know why you have felt or behaved a certain way. You may also feel overwhelmed about the treatment ahead and what it will mean for your life. With care and support, you can manage this condition and recover from it. How to manage lifestyle changes Managing stress and anxiety  Stress is your body's reaction to life changes and events, both good and bad. Most stress will last just a few hours, but stress can be ongoing and can lead to more than just stress. Although stress can play a major role in anxiety, it is not the same as anxiety. Stress is usually caused by something external, such as a deadline, test, or competition. Stress normally passes after the triggering event has ended.  Anxiety is caused by something internal, such as imagining a terrible outcome or worrying that something will go wrong that will devastate you. Anxiety often does not go away even after the triggering event is over, and it can become long-term (chronic) worry. It is important to understand the differences between stress and anxiety and to manage your stress effectively so that it does not lead to an anxious response. Talk with your health care provider or a counselor to learn more about reducing  anxiety and stress. He or she may suggest tension reduction techniques, such as:  Music therapy. This can include creating or listening to music that you enjoy and that inspires you.  Mindfulness-based meditation. This involves being aware of your normal breaths while not trying to control your breathing. It can be done while sitting or walking.  Centering prayer. This involves focusing on a word, phrase, or sacred image that means something to you and brings you peace.  Deep breathing. To do this, expand your stomach and inhale slowly through your nose. Hold your breath for 3-5 seconds. Then exhale slowly, letting your stomach muscles relax.  Self-talk. This involves identifying thought patterns that lead to anxiety reactions and changing those patterns.  Muscle relaxation. This involves tensing muscles and then relaxing them. Choose a tension reduction  technique that suits your lifestyle and personality. These techniques take time and practice. Set aside 5-15 minutes a day to do them. Therapists can offer counseling and training in these techniques. The training to help with anxiety may be covered by some insurance plans. Other things you can do to manage stress and anxiety include:  Keeping a stress/anxiety diary. This can help you learn what triggers your reaction and then learn ways to manage your response.  Thinking about how you react to certain situations. You may not be able to control everything, but you can control your response.  Making time for activities that help you relax and not feeling guilty about spending your time in this way.  Visual imagery and yoga can help you stay calm and relax.  Medicines Medicines can help ease symptoms. Medicines for anxiety include:  Anti-anxiety drugs.  Antidepressants. Medicines are often used as a primary treatment for anxiety disorder. Medicines will be prescribed by a health care provider. When used together, medicines, psychotherapy,  and tension reduction techniques may be the most effective treatment. Relationships Relationships can play a big part in helping you recover. Try to spend more time connecting with trusted friends and family members. Consider going to couples counseling, taking family education classes, or going to family therapy. Therapy can help you and others better understand your condition. How to recognize changes in your anxiety Everyone responds differently to treatment for anxiety. Recovery from anxiety happens when symptoms decrease and stop interfering with your daily activities at home or work. This may mean that you will start to:  Have better concentration and focus. Worry will interfere less in your daily thinking.  Sleep better.  Be less irritable.  Have more energy.  Have improved memory. It is important to recognize when your condition is getting worse. Contact your health care provider if your symptoms interfere with home or work and you feel like your condition is not improving. Follow these instructions at home: Activity  Exercise. Most adults should do the following: ? Exercise for at least 150 minutes each week. The exercise should increase your heart rate and make you sweat (moderate-intensity exercise). ? Strengthening exercises at least twice a week.  Get the right amount and quality of sleep. Most adults need 7-9 hours of sleep each night. Lifestyle   Eat a healthy diet that includes plenty of vegetables, fruits, whole grains, low-fat dairy products, and lean protein. Do not eat a lot of foods that are high in solid fats, added sugars, or salt.  Make choices that simplify your life.  Do not use any products that contain nicotine or tobacco, such as cigarettes, e-cigarettes, and chewing tobacco. If you need help quitting, ask your health care provider.  Avoid caffeine, alcohol, and certain over-the-counter cold medicines. These may make you feel worse. Ask your pharmacist  which medicines to avoid. General instructions  Take over-the-counter and prescription medicines only as told by your health care provider.  Keep all follow-up visits as told by your health care provider. This is important. Where to find support You can get help and support from these sources:  Self-help groups.  Online and [employer].  A trusted spiritual leader.  Couples counseling.  Family education classes.  Family therapy. Where to find more information You may find that joining a support group helps you deal with your anxiety. The following sources can help you locate counselors or support groups near you:  [place]: www.mentalhealthamerica.net  Anxiety and Depression Association  of [place] (ADAA): https://www.clark.net/  National Alliance on Mental Illness (NAMI): www.nami.org Contact a health care provider if you:  Have a hard time staying focused or finishing daily tasks.  Spend many hours a day feeling worried about everyday life.  Become exhausted by worry.  Start to have headaches, feel tense, or have nausea.  Urinate more than normal.  Have diarrhea. Get help right away if you have:  A racing heart and shortness of breath.  Thoughts of hurting yourself or others. If you ever feel like you may hurt yourself or others, or have thoughts about taking your own life, get help right away. You can go to your nearest emergency department or call:  Your local emergency services (911 in the [place]  A suicide crisis helpline, such as the [facility] at (929) 669-7545. This is open 24 hours a day. Summary  Taking steps to learn and use tension reduction techniques can help calm you and help prevent triggering an anxiety reaction.  When used together, medicines, psychotherapy, and tension reduction techniques may be the most effective treatment.  Family, friends, and partners can play a big part in helping you recover  from an anxiety disorder. This information is not intended to replace advice given to you by your health care provider. Make sure you discuss any questions you have with your health care provider. Document Revised: 01/17/2019 Document Reviewed: 01/17/2019 Elsevier Patient Education  [place].  Diabetes Mellitus and Nutrition, Adult When you have diabetes (diabetes mellitus), it is very important to have healthy eating habits because your blood sugar (glucose) levels are greatly affected by what you eat and drink. Eating healthy foods in the appropriate amounts, at about the same times every day, can help you:  Control your blood glucose.  Lower your risk of heart disease.  Improve your blood pressure.  Reach or maintain a healthy weight. Every person with diabetes is different, and each person has different needs for a meal plan. Your health care provider may recommend that you work with a diet and nutrition specialist (dietitian) to make a meal plan that is best for you. Your meal plan may vary depending on factors such as:  The calories you need.  The medicines you take.  Your weight.  Your blood glucose, blood pressure, and cholesterol levels.  Your activity level.  Other health conditions you have, such as heart or kidney disease. How do carbohydrates affect me? Carbohydrates, also called carbs, affect your blood glucose level more than any other type of food. Eating carbs naturally raises the amount of glucose in your blood. Carb counting is a method for keeping track of how many carbs you eat. Counting carbs is important to keep your blood glucose at a healthy level, especially if you use insulin or take certain oral diabetes medicines. It is important to know how many carbs you can safely have in each meal. This is different for every person. Your dietitian can help you calculate how many carbs you should have at each meal and for each snack. Foods that contain carbs  include:  Bread, cereal, rice, pasta, and crackers.  Potatoes and corn.  Peas, beans, and lentils.  Milk and yogurt.  Fruit and juice.  Desserts, such as cakes, cookies, ice cream, and candy. How does alcohol affect me? Alcohol can cause a sudden decrease in blood glucose (hypoglycemia), especially if you use insulin or take certain oral diabetes medicines. Hypoglycemia can be a life-threatening condition. Symptoms of hypoglycemia (  sleepiness, dizziness, and confusion) are similar to symptoms of having too much alcohol. If your health care provider says that alcohol is safe for you, follow these guidelines:  Limit alcohol intake to no more than 1 drink per day for nonpregnant women and 2 drinks per day for men. One drink equals 12 oz of beer, 5 oz of wine, or 1 oz of hard liquor.  Do not drink on an empty stomach.  Keep yourself hydrated with water, diet soda, or unsweetened iced tea.  Keep in mind that regular soda, juice, and other mixers may contain a lot of sugar and must be counted as carbs. What are tips for following this plan?  Reading food labels  Start by checking the serving size on the "Nutrition Facts" label of packaged foods and drinks. The amount of calories, carbs, fats, and other nutrients listed on the label is based on one serving of the item. Many items contain more than one serving per package.  Check the total grams (g) of carbs in one serving. You can calculate the number of servings of carbs in one serving by dividing the total carbs by 15. For example, if a food has 30 g of total carbs, it would be equal to 2 servings of carbs.  Check the number of grams (g) of saturated and trans fats in one serving. Choose foods that have low or no amount of these fats.  Check the number of milligrams (mg) of salt (sodium) in one serving. Most people should limit total sodium intake to less than 2,300 mg per day.  Always check the nutrition information of foods labeled  as "low-fat" or "nonfat". These foods may be higher in added sugar or refined carbs and should be avoided.  Talk to your dietitian to identify your daily goals for nutrients listed on the label. Shopping  Avoid buying canned, premade, or processed foods. These foods tend to be high in fat, sodium, and added sugar.  Shop around the outside edge of the grocery store. This includes fresh fruits and vegetables, bulk grains, fresh meats, and fresh dairy. Cooking  Use low-heat cooking methods, such as baking, instead of high-heat cooking methods like deep frying.  Cook using healthy oils, such as olive, canola, or sunflower oil.  Avoid cooking with butter, cream, or high-fat meats. Meal planning  Eat meals and snacks regularly, preferably at the same times every day. Avoid going long periods of time without eating.  Eat foods high in fiber, such as fresh fruits, vegetables, beans, and whole grains. Talk to your dietitian about how many servings of carbs you can eat at each meal.  Eat 4-6 ounces (oz) of lean protein each day, such as lean meat, chicken, fish, eggs, or tofu. One oz of lean protein is equal to: ? 1 oz of meat, chicken, or fish. ? 1 egg. ?  cup of tofu.  Eat some foods each day that contain healthy fats, such as avocado, nuts, seeds, and fish. Lifestyle  Check your blood glucose regularly.  Exercise regularly as told by your health care provider. This may include: ? 150 minutes of moderate-intensity or vigorous-intensity exercise each week. This could be brisk walking, biking, or water aerobics. ? Stretching and doing strength exercises, such as yoga or weightlifting, at least 2 times a week.  Take medicines as told by your health care provider.  Do not use any products that contain nicotine or tobacco, such as cigarettes and e-cigarettes. If you need help quitting, ask  your health care provider.  Work with a Social worker or diabetes educator to identify strategies to  manage stress and any emotional and social challenges. Questions to ask a health care provider  Do I need to meet with a diabetes educator?  Do I need to meet with a dietitian?  What number can I call if I have questions?  When are the best times to check my blood glucose? Where to find more information:  American Diabetes Association: diabetes.org  Academy of Nutrition and Dietetics: www.eatright.[employer] of Diabetes and Digestive and Kidney Diseases (NIH): [employer] Summary  A healthy meal plan will help you control your blood glucose and maintain a healthy lifestyle.  Working with a diet and nutrition specialist (dietitian) can help you make a meal plan that is best for you.  Keep in mind that carbohydrates (carbs) and alcohol have immediate effects on your blood glucose levels. It is important to count carbs and to use alcohol carefully. This information is not intended to replace advice given to you by your health care provider. Make sure you discuss any questions you have with your health care provider. Document Revised: 07/30/2017 Document Reviewed: 09/21/2016 Elsevier Patient Education  2020 [employer].
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
## 2019-10-27 LAB — HEMOGLOBIN A1C: Hgb A1c MFr Bld: 9.1 % — ABNORMAL HIGH (ref 4.6–6.5)

## 2019-10-30 ENCOUNTER — Encounter: Payer: Self-pay | Admitting: Internal Medicine

## 2019-10-30 ENCOUNTER — Ambulatory Visit: Payer: 59 | Admitting: Podiatry

## 2019-10-30 ENCOUNTER — Other Ambulatory Visit: Payer: Self-pay

## 2019-10-30 ENCOUNTER — Other Ambulatory Visit: Payer: Self-pay | Admitting: Family Medicine

## 2019-10-30 MED ORDER — ROSUVASTATIN CALCIUM 10 MG PO TABS: 10.0000 mg | ORAL_TABLET | Freq: Every day | ORAL | 3 refills | Status: DC

## 2019-10-31 ENCOUNTER — Ambulatory Visit: Payer: Self-pay | Admitting: Internal Medicine

## 2019-11-01 ENCOUNTER — Other Ambulatory Visit: Payer: Self-pay

## 2019-11-03 ENCOUNTER — Encounter: Payer: Self-pay | Admitting: Internal Medicine

## 2019-11-03 ENCOUNTER — Ambulatory Visit (INDEPENDENT_AMBULATORY_CARE_PROVIDER_SITE_OTHER): Payer: 59 | Admitting: Internal Medicine

## 2019-11-03 ENCOUNTER — Other Ambulatory Visit: Payer: Self-pay

## 2019-11-03 VITALS — BP 180/110 | HR 80 | Ht 66.0 in | Wt 190.0 lb

## 2019-11-03 DIAGNOSIS — E785 Hyperlipidemia, unspecified: Secondary | ICD-10-CM | POA: Diagnosis not present

## 2019-11-03 DIAGNOSIS — E669 Obesity, unspecified: Secondary | ICD-10-CM | POA: Diagnosis not present

## 2019-11-03 DIAGNOSIS — E1121 Type 2 diabetes mellitus with diabetic nephropathy: Secondary | ICD-10-CM

## 2019-11-03 NOTE — Progress Notes (Signed)
Patient ID: Casey Acosta, female   DOB: October 01, 1970, 49 y.o.   MRN: [id number]   This visit occurred during the SARS-CoV-2 public health emergency.  Safety protocols were in place, including screening questions prior to the visit, additional usage of staff PPE, and extensive cleaning of exam room while observing appropriate contact time as indicated for disinfecting solutions.   HPI: Casey Acosta is a 49 y.o.-year-old female, initially referred by her PCP, Dr. Glendon Axe, returning for follow-up for DM2, dx in 2010, insulin-dependent, uncontrolled, with complications (CKD).  Last visit 2 months ago (virtual).  Reviewed HbA1c levels: Lab Results  Component Value Date   HGBA1C 9.1 (H) 10/26/2019   HGBA1C 8.3 (A) 08/04/2018   HGBA1C 12.2 (A) 04/22/2018   HGBA1C 14.9 (H) 02/24/2018   She was on: - Levemir 10 >> 15 units at bedtime -started 02/24/2018 She has been Metformin >> tolerated this well, but stopped 2/2 CKD. Had diarrhea with the regular formulation  She tried [place] >> tolerated this well, but not covered by her insurance.  She is now on: - Metformin ER 500 mg 2x a day with meals >> IR 500 mg with dinner - Ozempic 0.5 mg weekly in a.m.  - samples >> off since 05/2020 as we did not have samples Previously on Levemir 25 units daily but only taking it 3 times a week, so we stopped.  Pt checks her sugars 1-2 times a day: - am: 300s >> 95-134 >> 130-140, 182 (soda) >> 98, 99 - 2h after b'fast: n/c - before lunch: n/c >> 111-120 >> n/c - 2h after lunch: n/c - before dinner: n/c >> 110-120 >> >> 160-170 - 2h after dinner: 300s >> 140-180 >> 99-160 >> n/c - bedtime: n/c - nighttime: n/c Lowest sugar was 200 >> 95 >> 99 >> 98. Highest sugar was 542 >> 200 >> 182 >> 200.  Glucometer: ReliOn  Pt's meals are: - Breakfast: boiled egg + bacon + toast; bisquit ([employer]) - Lunch: salad, chicken salad + croissant - Dinner: may skip - Snacks: chips, crackers  -+ CKD, last  BUN/creatinine:  Lab Results  Component Value Date   BUN 20 10/26/2019   BUN 18 05/20/2018   CREATININE 1.98 (H) 10/26/2019   CREATININE 1.34 (H) 05/20/2018  On olmesartan.  -+ HL; last set of lipids: Lab Results  Component Value Date   CHOL 289 (H) 10/26/2019   HDL 45.70 10/26/2019   LDLDIRECT 167.0 10/26/2019   TRIG (H) 10/26/2019    404.0 Triglyceride is over 400; calculations on Lipids are invalid.   CHOLHDL 6 10/26/2019  Previously on Lipitor 40 >> switched to Crestor 10 recently  - last eye exam was in 2019: No DR. Coming up in 2 weeks.  -She denies numbness and tingling in her feet.  Pt has FH of DM in mother and sister.  ROS: Constitutional: no weight gain/+ weight loss, no fatigue, no subjective hyperthermia, no subjective hypothermia Eyes: no blurry vision, no xerophthalmia ENT: no sore throat, no nodules palpated in neck, no dysphagia, no odynophagia, no hoarseness Cardiovascular: no CP/no SOB/no palpitations/no leg swelling Respiratory: no cough/no SOB/no wheezing Gastrointestinal: no N/no V/no D/no C/no acid reflux Musculoskeletal: no muscle aches/no joint aches Skin: no rashes, no hair loss Neurological: no tremors/no numbness/no tingling/no dizziness  I reviewed pt's medications, allergies, PMH, social hx, family hx, and changes were documented in the history of present illness. Otherwise, unchanged from my initial visit note.   Past Medical History:  Diagnosis Date  .  Anemia   . Anxiety   . Anxiety and depression   . Chronic kidney disease   . Colon polyp   . Diabetes mellitus without complication ([place])   . Dysuria   . High risk sexual behavior   . History of syncope   . Hyperlipidemia   . Hypertension   . Internal hemorrhoids   . LVH (left ventricular hypertrophy)   . Malaise and fatigue   . Obesity (BMI 30-39.9)   . Ovarian mass   . Panic disorder   . PCOS (polycystic ovarian syndrome)   . Rectal bleeding   . Shortness of breath   .  Surgical menopause   . Vaginal trichomoniasis    Past Surgical History:  Procedure Laterality Date  . ABDOMINAL HYSTERECTOMY     partial  . APPENDECTOMY    . CHOLECYSTECTOMY    . TUBAL LIGATION     Social History   Socioeconomic History  . Marital status: Single    Spouse name: Not on file  . Number of children: 2  . Years of education: Not on file  . Highest education level: Not on file  Occupational History  . nurse  Social Needs  . Financial resource strain: Not on file  . Food insecurity:    Worry: Not on file    Inability: Not on file  . Transportation needs:    Medical: Not on file    Non-medical: Not on file  Tobacco Use  . Smoking status: Never Smoker  . Smokeless tobacco: Never Used  Substance and Sexual Activity  . Alcohol use:  Occasional 1 glass of wine  . Drug use: No   Current Outpatient Medications on File Prior to Visit  Medication Sig Dispense Refill  . amLODipine-olmesartan (AZOR) 10-40 MG tablet Take 1 tablet by mouth daily. 30 tablet 1  . aspirin EC 81 MG tablet Take 81 mg by mouth daily.    . blood glucose meter kit and supplies KIT Dispense based on patient and insurance preference. Use up to four times daily as directed. 1 each 0  . buPROPion (WELLBUTRIN SR) 150 MG 12 hr tablet Take 1 tablet (150 mg total) by mouth 2 (two) times daily. 60 tablet 1  . cloNIDine (CATAPRES) 0.1 MG tablet Take 1 tablet (0.1 mg total) by mouth daily. 90 tablet 0  . escitalopram (LEXAPRO) 20 MG tablet Take 1 tablet (20 mg total) by mouth daily. 30 tablet 3  . hydrOXYzine (ATARAX/VISTARIL) 50 MG tablet Take 1 tablet (50 mg total) by mouth 3 (three) times daily as needed. 60 tablet 1  . metFORMIN (GLUCOPHAGE) 500 MG tablet Take 1 tablet (500 mg total) by mouth daily with breakfast. 90 tablet 3  . metoprolol succinate (TOPROL-XL) 100 MG 24 hr tablet TAKE 1 TABLET BY MOUTH ONCE DAILY. TAKE WITH OR IMMEDIATELY FOLLOWING A MEAL. 90 tablet 0  . rosuvastatin (CRESTOR) 10 MG  tablet Take 1 tablet (10 mg total) by mouth daily. 30 tablet 3  . Semaglutide,0.25 or 0.'5MG'$ /DOS, (OZEMPIC, 0.25 OR 0.5 MG/DOSE,) 2 MG/1.5ML SOPN Inject 0.5 mg into the skin once a week. 1.5 mL 0  . triamterene-hydrochlorothiazide (MAXZIDE-25) 37.5-25 MG tablet Take 1 tablet by mouth daily. 30 tablet 1   No current facility-administered medications on file prior to visit.   Also, Levemir, 15 units at bedtime.  Allergies  Allergen Reactions  . Dilaudid [Hydromorphone Hcl] Nausea And Vomiting  . Prozac [Fluoxetine Hcl]     hallucinations   Family History  Problem Relation Age of Onset  . Anxiety disorder Mother   . Hyperlipidemia Mother   . Depression Mother   . Hypertension Mother   . Diabetes Mother   . Thyroid disease Mother   . Hypertension Sister   . Diabetes Sister   . ALS Father   . Crohn's disease Brother   . Crohn's disease Sister    PE: BP (!) 180/110   Pulse 80   Ht '5\' 6"'$  (1.676 m)   Wt 190 lb (86.2 kg)   SpO2 99%   BMI 30.67 kg/m  Wt Readings from Last 3 Encounters:  11/03/19 190 lb (86.2 kg)  10/26/19 188 lb (85.3 kg)  11/28/18 201 lb (91.2 kg)   Constitutional: overweight, in NAD Eyes: PERRLA, EOMI, no exophthalmos ENT: moist mucous membranes, no thyromegaly, no cervical lymphadenopathy Cardiovascular: RRR, No MRG Respiratory: CTA B Gastrointestinal: abdomen soft, NT, ND, BS+ Musculoskeletal: no deformities, strength intact in all 4 Skin: moist, warm, no rashes Neurological: no tremor with outstretched hands, DTR normal in all 4  ASSESSMENT: 1. DM2, insulin-dependent, uncontrolled, with long-term complications - CKD  2.  Hyperlipidemia  3.  Obesity class I  PLAN:  1. Patient with longstanding, uncontrolled, type 2 diabetes, previously on long-acting insulin, after which we added Metformin and GLP-1 receptor agonist.  HbA1c improved from 12.2% to 8.3%.  We are now using a low dose of Metformin due to her decreased kidney function.  She is on  Ozempic 0.5 mg weekly, which she is tolerating well.  Unfortunately, she is relying on asked for Ozempic samples, which is not an ideal situation, since we do not consistently have these in the office. - At last visit, she lost weight and feeling better, however, she gained approximately 8 pounds since then and also she had a recent HbA1c which returned higher than before, at 9.1%. -She does not know she was sent to me for the last 5 months as we do not have samples.  At today's visit we can but we also discussed about getting into patient assistance program from [employer] for free.  In that case, we can do higher dose of Ozempic, 1 mg weekly.  I advised her to let me know when she is close to running out of the plan to see if we can get problems, but if we do not have these, we can try to give her Victoza or even Rybelsus.  If none are available, we can try glipizide before breakfast and dinner, to bridge her until she can restart the GLP-1 receptor agonist.  Unfortunately, we do not have many options for treatment due to her CKD - I suggested to:  Patient Instructions  Please continue: - Metformin ER 500 mg daily at dinnertime  Please restart: - Ozempic 0.5 mg weekly in a.m.   Please return in 3 months with your sugar log.   - advised to check sugars at different times of the day - 1-2x a day, rotating check times - advised for yearly eye exams >> she is not UTD - return to clinic in 3 months  2. HL -Reviewed latest lipid panel from 1.5 months ago: LDL very high, as were her triglycerides Lab Results  Component Value Date   CHOL 289 (H) 10/26/2019   HDL 45.70 10/26/2019   LDLDIRECT 167.0 10/26/2019   TRIG (H) 10/26/2019    404.0 Triglyceride is over 400; calculations on Lipids are invalid.   CHOLHDL 6 10/26/2019  -PCP recommended Crestor 10 >>  switched to this on Lipitor  3.  Obesity class I -Per review of the chart, she lost approximately 13 pounds since last in person  visit, but gained some weight back since last virtual visit - now 11 lbs -We will restart Ozempic which should also help with weight loss  Philemon Kingdom, MD PhD [facility] Endocrinology

## 2019-11-03 NOTE — Patient Instructions (Addendum)
Please continue: - Metformin ER 500 mg daily at dinnertime  Please restart: - Ozempic 0.5 mg weekly in a.m.   Please return in 3 months with your sugar log.

## 2019-11-17 ENCOUNTER — Telehealth: Payer: Self-pay | Admitting: Family Medicine

## 2019-11-17 ENCOUNTER — Other Ambulatory Visit: Payer: Self-pay | Admitting: Family Medicine

## 2019-11-17 DIAGNOSIS — I10 Essential (primary) hypertension: Secondary | ICD-10-CM

## 2019-11-17 NOTE — Telephone Encounter (Signed)
Pt would like a 90 day refill of both medications  Medication Refill:  Triamterene-hydrochlorothiazide  Amlodipine-olmesartan   Ph[facility]: [name]: [phone]

## 2019-11-17 NOTE — Telephone Encounter (Signed)
error 

## 2019-11-20 ENCOUNTER — Telehealth: Payer: Self-pay | Admitting: Family Medicine

## 2019-11-20 ENCOUNTER — Telehealth (INDEPENDENT_AMBULATORY_CARE_PROVIDER_SITE_OTHER): Payer: 59 | Admitting: Family Medicine

## 2019-11-20 DIAGNOSIS — R531 Weakness: Secondary | ICD-10-CM

## 2019-11-20 DIAGNOSIS — R55 Syncope and collapse: Secondary | ICD-10-CM | POA: Diagnosis not present

## 2019-11-20 DIAGNOSIS — I10 Essential (primary) hypertension: Secondary | ICD-10-CM

## 2019-11-20 DIAGNOSIS — E1121 Type 2 diabetes mellitus with diabetic nephropathy: Secondary | ICD-10-CM | POA: Diagnosis not present

## 2019-11-20 DIAGNOSIS — G4700 Insomnia, unspecified: Secondary | ICD-10-CM

## 2019-11-20 NOTE — Progress Notes (Addendum)
Virtual Visit via Video Note  I connected with Casey Acosta on 11/20/19 at 10:30 AM EDT by a video enabled telemedicine application 2/2 [id number]-52 pandemic and verified that I am speaking with the correct person using two identifiers.  Location patient: home Location provider:work or home office Persons participating in the virtual visit: patient, provider.  Pt's bf nearby.  I discussed the limitations of evaluation and management by telemedicine and the availability of in person appointments. The patient expressed understanding and agreed to proceed.   HPI: Pt is a 49 yo female with pmh sig for HTN, DM II, CKD 3b, HLD, anxiety and depression.  Pt endorses syncopal episode yesterday while visiting her brother in [place], [place].  Pt states she does not remember what happened.  Only remembers leaving the ED and being unable to walk.  CT head was negative in ED per chart review.  Creatinine was 1.9, glucose 184 UA negative.  Pt states he is still having R sided weakness in arm and leg.  Pt states this was confirmed by her son in law who works in [place].  Pt denies speech difficulty, HA, changes in vision, CP.  Pt slept on the couch as unable to go up stairs to her room.  Pt states bs around 200s at home.  Followed by Endocrinology., Dr. Cruzita Lederer. Taking metformin ER 500 mg daily (decreased due to renal funct.) and Ozempic 0.5 mg wkly.  States bp has been good.   Pt states she stopped Wellbutrin SR BID as it caused a residual tired feeling.  Pt tries to get in bed at 8 pm, but may be up until 12 or 1 am.  Hydralazine works some time.  In the past lexapro 20 mg stopped working and caused HAs.  Celexa did not work.  Prozac caused hallucinations.  Xanax helped.  Pt gets off work at 6 pm, eats dinner at 7 pm.  Pt endorses drinking a lot a soda, but cut down.  States does not like water.  ROS: See pertinent positives and negatives per HPI.  Past Medical History:  Diagnosis Date  . Anemia   . Anxiety    . Anxiety and depression   . Chronic kidney disease   . Colon polyp   . Diabetes mellitus without complication ([place])   . Dysuria   . High risk sexual behavior   . History of syncope   . Hyperlipidemia   . Hypertension   . Internal hemorrhoids   . LVH (left ventricular hypertrophy)   . Malaise and fatigue   . Obesity (BMI 30-39.9)   . Ovarian mass   . Panic disorder   . PCOS (polycystic ovarian syndrome)   . Rectal bleeding   . Shortness of breath   . Surgical menopause   . Vaginal trichomoniasis     Past Surgical History:  Procedure Laterality Date  . ABDOMINAL HYSTERECTOMY     partial  . APPENDECTOMY    . CHOLECYSTECTOMY    . TUBAL LIGATION      Family History  Problem Relation Age of Onset  . Anxiety disorder Mother   . Hyperlipidemia Mother   . Depression Mother   . Hypertension Mother   . Diabetes Mother   . Thyroid disease Mother   . Hypertension Sister   . Diabetes Sister   . ALS Father   . Crohn's disease Brother   . Crohn's disease Sister     Current Outpatient Medications:  .  amLODipine-olmesartan (AZOR) 10-40 MG tablet, TAKE  ONE TABLET BY MOUTH DAILY, Disp: 30 tablet, Rfl: 1 .  aspirin EC 81 MG tablet, Take 81 mg by mouth daily., Disp: , Rfl:  .  blood glucose meter kit and supplies KIT, Dispense based on patient and insurance preference. Use up to four times daily as directed., Disp: 1 each, Rfl: 0 .  buPROPion (WELLBUTRIN SR) 150 MG 12 hr tablet, Take 1 tablet (150 mg total) by mouth 2 (two) times daily., Disp: 60 tablet, Rfl: 1 .  cloNIDine (CATAPRES) 0.1 MG tablet, Take 1 tablet (0.1 mg total) by mouth daily., Disp: 90 tablet, Rfl: 0 .  escitalopram (LEXAPRO) 20 MG tablet, Take 1 tablet (20 mg total) by mouth daily., Disp: 30 tablet, Rfl: 3 .  hydrOXYzine (ATARAX/VISTARIL) 50 MG tablet, Take 1 tablet (50 mg total) by mouth 3 (three) times daily as needed., Disp: 60 tablet, Rfl: 1 .  metFORMIN (GLUCOPHAGE) 500 MG tablet, Take 1 tablet (500 mg  total) by mouth daily with breakfast., Disp: 90 tablet, Rfl: 3 .  metoprolol succinate (TOPROL-XL) 100 MG 24 hr tablet, TAKE 1 TABLET BY MOUTH ONCE DAILY. TAKE WITH OR IMMEDIATELY FOLLOWING A MEAL., Disp: 90 tablet, Rfl: 0 .  rosuvastatin (CRESTOR) 10 MG tablet, Take 1 tablet (10 mg total) by mouth daily., Disp: 30 tablet, Rfl: 3 .  Semaglutide,0.25 or 0.'5MG'$ /DOS, (OZEMPIC, 0.25 OR 0.5 MG/DOSE,) 2 MG/1.5ML SOPN, Inject 0.5 mg into the skin once a week. (Patient not taking: Reported on 11/03/2019), Disp: 1.5 mL, Rfl: 0 .  triamterene-hydrochlorothiazide (MAXZIDE-25) 37.5-25 MG tablet, TAKE ONE TABLET BY MOUTH DAILY, Disp: 30 tablet, Rfl: 1  EXAM:  VITALS per patient if applicable:  RR between 12-20 bpm  GENERAL: alert, oriented, appears well and in no acute distress  HEENT: atraumatic, conjunctiva clear, no obvious abnormalities on inspection of external nose and ears  NECK: normal movements of the head and neck  LUNGS: on inspection no signs of respiratory distress, breathing rate appears normal, no obvious gross SOB, gasping or wheezing  CV: no obvious cyanosis  MS: moves all visible extremities without noticeable abnormality  PSYCH/NEURO: pleasant and cooperative, no obvious depression or anxiety, speech and thought processing grossly intact  ASSESSMENT AND PLAN:  Discussed the following assessment and plan:  Lab results and imaging results available in chart review from OSH on 11/19/19, however EKG and provider notes are not available.  Right sided weakness  -discussed concern for CVA.  Consider withdrawal from Wellbutrin as stopped last wk. -Per chart review CT head on 11/19/2019 without acute findings.  Consider MRI for further assessment -Given concern for possible CVA will obtain MRI without contrast as GFR in the low 30s and patient is on Metformin. -Continue ASA 81 mg daily and Crestor as lipid panel elevated 10/26/19.  Previously on lipitor 40 mg, switched to Crestor on 11/01/19  2/2 pt preference. -Discussed the importance of good glycemic and blood pressure control. -Given precautions -consider PT - Plan: Ambulatory referral to Neurology, MR Brain Wo Contrast  Syncope, unspecified syncope type  - Plan: Ambulatory referral to Neurology, MR Brain Wo Contrast  Type 2 diabetes mellitus with diabetic nephropathy, without long-term current use of insulin ([place]) -Uncontrolled -Pt strongly encouraged to decrease the intake of carbs including bread, pasta, potatoes. -Discussed the importance of increasing p.o. intake of water -Patient encouraged to continue weight loss -Continue current Metformin ER 500 mg and Ozempic 0.5 mg weekly -Patient urged to look into medication assistance programs -Yearly eye exam encouraged -Continue follow-up with endocrinology,  Dr. Cruzita Lederer - Plan: Ambulatory referral to Nephrology  Essential hypertension -Elevated at endocrinology appointment on 11/03/2019 -Continue current medications including Toprol-XL, amlodipine-olmesartan 10-40 mg, triamterene-HCTZ 37.5-25 mg. -Given increasing creatinine consider d/c'ing triameterene-HCTZ 37.5-25 mg. -Lifestyle modification strongly encouraged -Pt advised to check BP at home daily and keep a log to bring with her to clinic -Follow-up in 1-2 weeks in clinic to make medication adjustments.  Insomnia, unspecified type -Sleep hygiene -d/c Wellbutrin SR 150 mg  F/u in clinic in 1 wk.  Given precautions for new or worsening symptoms   I discussed the assessment and treatment plan with the patient. The patient was provided an opportunity to ask questions and all were answered. The patient agreed with the plan and demonstrated an understanding of the instructions.   The patient was advised to call back or seek an in-person evaluation if the symptoms worsen or if the condition fails to improve as anticipated.  More than 50% of over 40 minutes spent in total in caring for this patient, counseling, and/or  coordinating care.    Billie Ruddy, MD

## 2019-11-20 NOTE — Telephone Encounter (Signed)
Pt states that the soonest availability with Neurology is in June. Pt states that she does not believe [facility] wants her to wait that long since she is experiencing symptoms of stroke. Pt would like advice on what to do or if she can make it urgent?   Pt can be reached at [phone]

## 2019-11-20 NOTE — Telephone Encounter (Signed)
  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

## 2019-11-22 ENCOUNTER — Other Ambulatory Visit: Payer: 59

## 2019-11-24 ENCOUNTER — Encounter: Payer: Self-pay | Admitting: Internal Medicine

## 2019-11-28 ENCOUNTER — Other Ambulatory Visit: Payer: Self-pay

## 2019-11-29 ENCOUNTER — Encounter: Payer: Self-pay | Admitting: Internal Medicine

## 2019-11-29 ENCOUNTER — Ambulatory Visit (INDEPENDENT_AMBULATORY_CARE_PROVIDER_SITE_OTHER): Payer: 59 | Admitting: Women's Health

## 2019-11-29 ENCOUNTER — Encounter: Payer: Self-pay | Admitting: Women's Health

## 2019-11-29 VITALS — BP 136/80 | Ht 66.0 in | Wt 192.0 lb

## 2019-11-29 DIAGNOSIS — N898 Other specified noninflammatory disorders of vagina: Secondary | ICD-10-CM | POA: Diagnosis not present

## 2019-11-29 DIAGNOSIS — R102 Pelvic and perineal pain: Secondary | ICD-10-CM | POA: Diagnosis not present

## 2019-11-29 DIAGNOSIS — Z01419 Encounter for gynecological examination (general) (routine) without abnormal findings: Secondary | ICD-10-CM | POA: Diagnosis not present

## 2019-11-29 LAB — WET PREP FOR TRICH, YEAST, CLUE

## 2019-11-29 MED ORDER — TINIDAZOLE 500 MG PO TABS: ORAL_TABLET | ORAL | 0 refills | Status: DC

## 2019-11-29 NOTE — Patient Instructions (Addendum)
[facility] center  [phone]   Vit D 2000 iu daily Colonoscopy  [facility] [phone] Dr Carlean Purl and Dr Raquel James Health Maintenance, Female Adopting a healthy lifestyle and getting preventive care are important in promoting health and wellness. Ask your health care provider about:  The right schedule for you to have regular tests and exams.  Things you can do on your own to prevent diseases and keep yourself healthy. What should I know about diet, weight, and exercise? Eat a healthy diet   Eat a diet that includes plenty of vegetables, fruits, low-fat dairy products, and lean protein.  Do not eat a lot of foods that are high in solid fats, added sugars, or sodium. Maintain a healthy weight Body mass index (BMI) is used to identify weight problems. It estimates body fat based on height and weight. Your health care provider can help determine your BMI and help you achieve or maintain a healthy weight. Get regular exercise Get regular exercise. This is one of the most important things you can do for your health. Most adults should:  Exercise for at least 150 minutes each week. The exercise should increase your heart rate and make you sweat (moderate-intensity exercise).  Do strengthening exercises at least twice a week. This is in addition to the moderate-intensity exercise.  Spend less time sitting. Even light physical activity can be beneficial. Watch cholesterol and blood lipids Have your blood tested for lipids and cholesterol at 51 years of age, then have this test every 5 years. Have your cholesterol levels checked more often if:  Your lipid or cholesterol levels are high.  You are older than 51 years of age.  You are at high risk for heart disease. What should I know about cancer screening? Depending on your health history and family history, you may need to have cancer screening at various ages. This may include screening for:  Breast cancer.  Cervical cancer.  Colorectal  cancer.  Skin cancer.  Lung cancer. What should I know about heart disease, diabetes, and high blood pressure? Blood pressure and heart disease  High blood pressure causes heart disease and increases the risk of stroke. This is more likely to develop in people who have high blood pressure readings, are of African descent, or are overweight.  Have your blood pressure checked: ? Every 3-5 years if you are 68-68 years of age. ? Every year if you are 58 years old or older. Diabetes Have regular diabetes screenings. This checks your fasting blood sugar level. Have the screening done:  Once every three years after age 36 if you are at a normal weight and have a low risk for diabetes.  More often and at a younger age if you are overweight or have a high risk for diabetes. What should I know about preventing infection? Hepatitis B If you have a higher risk for hepatitis B, you should be screened for this virus. Talk with your health care provider to find out if you are at risk for hepatitis B infection. Hepatitis C Testing is recommended for:  Everyone born from [birth year] through 1967.  Anyone with known risk factors for hepatitis C. Sexually transmitted infections (STIs)  Get screened for STIs, including gonorrhea and chlamydia, if: ? You are sexually active and are younger than 51 years of age. ? You are older than 51 years of age and your health care provider tells you that you are at risk for this type of infection. ? Your sexual activity has changed  since you were last screened, and you are at increased risk for chlamydia or gonorrhea. Ask your health care provider if you are at risk.  Ask your health care provider about whether you are at high risk for HIV. Your health care provider may recommend a prescription medicine to help prevent HIV infection. If you choose to take medicine to prevent HIV, you should first get tested for HIV. You should then be tested every 3 months for as long as  you are taking the medicine. Pregnancy  If you are about to stop having your period (premenopausal) and you may become pregnant, seek counseling before you get pregnant.  Take 400 to 800 micrograms (mcg) of folic acid every day if you become pregnant.  Ask for birth control (contraception) if you want to prevent pregnancy. Osteoporosis and menopause Osteoporosis is a disease in which the bones lose minerals and strength with aging. This can result in bone fractures. If you are 47 years old or older, or if you are at risk for osteoporosis and fractures, ask your health care provider if you should:  Be screened for bone loss.  Take a calcium or vitamin D supplement to lower your risk of fractures.  Be given hormone replacement therapy (HRT) to treat symptoms of menopause. Follow these instructions at home: Lifestyle  Do not use any products that contain nicotine or tobacco, such as cigarettes, e-cigarettes, and chewing tobacco. If you need help quitting, ask your health care provider.  Do not use street drugs.  Do not share needles.  Ask your health care provider for help if you need support or information about quitting drugs. Alcohol use  Do not drink alcohol if: ? Your health care provider tells you not to drink. ? You are pregnant, may be pregnant, or are planning to become pregnant.  If you drink alcohol: ? Limit how much you use to 0-1 drink a day. ? Limit intake if you are breastfeeding.  Be aware of how much alcohol is in your drink. In the [place] one drink equals one 12 oz bottle of beer (355 mL), one 5 oz glass of wine (148 mL), or one 1 oz glass of hard liquor (44 mL). General instructions  Schedule regular health, dental, and eye exams.  Stay current with your vaccines.  Tell your health care provider if: ? You often feel depressed. ? You have ever been abused or do not feel safe at home. Summary  Adopting a healthy lifestyle and getting preventive care are  important in promoting health and wellness.  Follow your health care provider's instructions about healthy diet, exercising, and getting tested or screened for diseases.  Follow your health care provider's instructions on monitoring your cholesterol and blood pressure. This information is not intended to replace advice given to you by your health care provider. Make sure you discuss any questions you have with your health care provider. Document Revised: 08/10/2018 Document Reviewed: 08/10/2018 Elsevier Patient Education  [place].   Bacterial Vaginosis  Bacterial vaginosis is an infection of the vagina. It happens when too many normal germs (healthy bacteria) grow in the vagina. This infection puts you at risk for infections from sex (STIs). Treating this infection can lower your risk for some STIs. You should also treat this if you are pregnant. It can cause your baby to be born early. Follow these instructions at home: Medicines  Take over-the-counter and prescription medicines only as told by your doctor.  Take or use your  antibiotic medicine as told by your doctor. Do not stop taking or using it even if you start to feel better. General instructions  If you your sexual partner is a woman, tell her that you have this infection. She needs to get treatment if she has symptoms. If you have a female partner, he does not need to be treated.  During treatment: ? Avoid sex. ? Do not douche. ? Avoid alcohol as told. ? Avoid breastfeeding as told.  Drink enough fluid to keep your pee (urine) clear or pale yellow.  Keep your vagina and butt (rectum) clean. ? Wash the area with warm water every day. ? Wipe from front to back after you use the toilet.  Keep all follow-up visits as told by your doctor. This is important. Preventing this condition  Do not douche.  Use only warm water to wash around your vagina.  Use protection when you have sex. This includes: ? Latex  condoms. ? Dental dams.  Limit how many people you have sex with. It is best to only have sex with the same person (be monogamous).  Get tested for STIs. Have your partner get tested.  Wear underwear that is cotton or lined with cotton.  Avoid tight pants and pantyhose. This is most important in summer.  Do not use any products that have nicotine or tobacco in them. These include cigarettes and e-cigarettes. If you need help quitting, ask your doctor.  Do not use illegal drugs.  Limit how much alcohol you drink. Contact a doctor if:  Your symptoms do not get better, even after you are treated.  You have more discharge or pain when you pee (urinate).  You have a fever.  You have pain in your belly (abdomen).  You have pain with sex.  Your bleed from your vagina between periods. Summary  This infection happens when too many germs (bacteria) grow in the vagina.  Treating this condition can lower your risk for some infections from sex (STIs).  You should also treat this if you are pregnant. It can cause early (premature) birth.  Do not stop taking or using your antibiotic medicine even if you start to feel better. This information is not intended to replace advice given to you by your health care provider. Make sure you discuss any questions you have with your health care provider. Document Revised: 07/30/2017 Document Reviewed: 05/02/2016 Elsevier Patient Education  2020 [employer].

## 2019-11-29 NOTE — Progress Notes (Addendum)
Stellar Gensel Nov 25, 1970 [id number]    History:    Presents for annual exam.  1996 TAH for fibroids on no HRT.  Normal Pap history has not had a screening mammogram.  Primary care manages hypertension, diabetes, hypercholesteremia and kidney disease.  Same partner for years.   Past medical history, past surgical history, family history and social history were all reviewed and documented in the EPIC chart.  Midwife at [employer].  Parents hypertension, mother diabetes.  Children ages 57 and 72 both doing well.  ROS:  A ROS was performed and pertinent positives and negatives are included.  Exam:  Vitals:   11/29/19 0921  BP: 136/80  Weight: 192 lb (87.1 kg)  Height: 5\' 6"  (1.676 m)   Body mass index is 30.99 kg/m.   General appearance:  Normal Thyroid:  Symmetrical, normal in size, without palpable masses or nodularity. Respiratory  Auscultation:  Clear without wheezing or rhonchi Cardiovascular  Auscultation:  Regular rate, without rubs, murmurs or gallops  Edema/varicosities:  Not grossly evident Abdominal  Soft,nontender, without masses, guarding or rebound.  Liver/spleen:  No organomegaly noted  Hernia:  None appreciated  Skin  Inspection:  Grossly normal   Breasts: Examined lying and sitting.     Right: Without masses, retractions, discharge or axillary adenopathy.     Left: Without masses, retractions, discharge or axillary adenopathy. Gentitourinary   Inguinal/mons:  Normal without inguinal adenopathy  External genitalia:  Enlarged/long labia minora causing irritation  BUS/Urethra/Skene's glands:  Normal  Vagina: Moderate white discharge wet prep positive for clues, TNTC bacteria   Cervix: And uterus absent   Adnexa/parametria:     Rt: Without masses or tenderness.   Lt: Without masses or tenderness.  Anus and perineum: Normal  Digital rectal exam: Normal sphincter tone without palpated masses or tenderness  Assessment/Plan:  49 y.o. SBF G4 P2 for annual  exam with no urinary complaints.  Has had low abdominal/pelvic pressure.  1996 TAH for fibroids on no HRT Hypertension, diabetes, hypercholesteremia, primary care manages labs and meds Bacterial vaginosis Labia minora hypertrophy  Plan: Tindamax 2 g today, repeat tomorrow alcohol precautions reviewed.  Instructed to call if continued vaginal discharge.  Has had some low abdominal cramping/pressure if symptoms persist instructed to call back we will get an ultrasound. Labia minora hypertrophy discussed possible labiaplasty, will need to discuss with  Dr Delilah Shan.   SBEs, reviewed importance of annual screening mammogram, breast center information given instructed to schedule.  Congratulations given on weight loss encouraged to continue healthy diet and regular exercise.  Calcium rich foods, vitamin D 2000 IUs daily encouraged.B Screening colonoscopy discussed, by her GI information given instructed to schedule reviewed importance.    [place], 9:53 AM 11/29/2019

## 2019-12-05 ENCOUNTER — Other Ambulatory Visit: Payer: 59

## 2019-12-07 ENCOUNTER — Telehealth: Payer: Self-pay

## 2019-12-07 NOTE — Telephone Encounter (Signed)
I received note from [place] asking me to check patient's insurance benefits for labioplasty.  I called patient and explained that insurance company will not guarantee payment ahead of surgery and payment is determined at the time the claim is received and based on medical necessity, the plan being effective, terms of plan, etc.  I told her she does have diagnosis of labial hypertrophy N90.6 and [place] stated it is not for cosmetic purposes but because of discomfort/irritation so hopefully they would see that as medically indicated and cover it. I reviewed her large deductible and co-insurance with her and also her estimated surgery prepymt due to [place] by one week prior to surgery. She wanted to schedule an appointment to come and see Dr. Delilah Shan and see if he will perform the surgery.  Appt scheduled 11/30/19.

## 2019-12-11 ENCOUNTER — Other Ambulatory Visit: Payer: 59

## 2019-12-11 ENCOUNTER — Ambulatory Visit: Payer: 59 | Admitting: Women's Health

## 2019-12-25 ENCOUNTER — Other Ambulatory Visit: Payer: Self-pay | Admitting: Nephrology

## 2019-12-25 DIAGNOSIS — N1832 Chronic kidney disease, stage 3b: Secondary | ICD-10-CM

## 2019-12-25 DIAGNOSIS — N189 Chronic kidney disease, unspecified: Secondary | ICD-10-CM

## 2019-12-25 DIAGNOSIS — N2581 Secondary hyperparathyroidism of renal origin: Secondary | ICD-10-CM

## 2019-12-25 DIAGNOSIS — E1122 Type 2 diabetes mellitus with diabetic chronic kidney disease: Secondary | ICD-10-CM

## 2020-01-01 ENCOUNTER — Ambulatory Visit (INDEPENDENT_AMBULATORY_CARE_PROVIDER_SITE_OTHER): Payer: 59 | Admitting: Obstetrics and Gynecology

## 2020-01-01 ENCOUNTER — Other Ambulatory Visit: Payer: Self-pay

## 2020-01-01 ENCOUNTER — Ambulatory Visit
Admission: RE | Admit: 2020-01-01 | Discharge: 2020-01-01 | Disposition: A | Discharge status: Home / Self Care | Payer: 59 | Source: Ambulatory Visit | Attending: Nephrology | Admitting: Nephrology

## 2020-01-01 ENCOUNTER — Encounter: Payer: Self-pay | Admitting: Obstetrics and Gynecology

## 2020-01-01 VITALS — BP 134/82

## 2020-01-01 DIAGNOSIS — N906 Unspecified hypertrophy of vulva: Secondary | ICD-10-CM | POA: Diagnosis not present

## 2020-01-01 DIAGNOSIS — N1832 Chronic kidney disease, stage 3b: Secondary | ICD-10-CM

## 2020-01-01 DIAGNOSIS — E1122 Type 2 diabetes mellitus with diabetic chronic kidney disease: Secondary | ICD-10-CM

## 2020-01-01 DIAGNOSIS — N2581 Secondary hyperparathyroidism of renal origin: Secondary | ICD-10-CM

## 2020-01-01 DIAGNOSIS — D631 Anemia in chronic kidney disease: Secondary | ICD-10-CM

## 2020-01-01 NOTE — Progress Notes (Signed)
Casey Acosta 1971/02/18 [id number]  SUBJECTIVE:  49 y.o. [id number] female with history of hypertension, diabetes, hypercholesterolemia, presents for discussion of potential labioplasty.  In the last year or two she has noticed that her right labia does protrude significantly further down compared to her left side, to the point where it is causing her discomfort with catching on clothing and occasionally causing her some pain.  She denies any vaginal bleeding or abnormal discharge.  Previous TAH for fibroids/menorrhagia.  She has had vaginal births with an episiotomy in the past. Most recent A1c was 9.1% 10/26/2019.  Current Outpatient Medications  Medication Sig Dispense Refill  . amLODipine-olmesartan (AZOR) 10-40 MG tablet TAKE ONE TABLET BY MOUTH DAILY 30 tablet 1  . aspirin EC 81 MG tablet Take 81 mg by mouth daily.    . blood glucose meter kit and supplies KIT Dispense based on patient and insurance preference. Use up to four times daily as directed. 1 each 0  . cloNIDine (CATAPRES) 0.1 MG tablet Take 1 tablet (0.1 mg total) by mouth daily. 90 tablet 0  . furosemide (LASIX) 20 MG tablet Take 20 mg by mouth.    . hydrOXYzine (ATARAX/VISTARIL) 50 MG tablet Take 1 tablet (50 mg total) by mouth 3 (three) times daily as needed. 60 tablet 1  . metFORMIN (GLUCOPHAGE) 500 MG tablet Take 1 tablet (500 mg total) by mouth daily with breakfast. 90 tablet 3  . metoprolol succinate (TOPROL-XL) 100 MG 24 hr tablet TAKE 1 TABLET BY MOUTH ONCE DAILY. TAKE WITH OR IMMEDIATELY FOLLOWING A MEAL. 90 tablet 0  . rosuvastatin (CRESTOR) 10 MG tablet Take 1 tablet (10 mg total) by mouth daily. 30 tablet 3  . Semaglutide,0.25 or 0.'5MG'$ /DOS, (OZEMPIC, 0.25 OR 0.5 MG/DOSE,) 2 MG/1.5ML SOPN Inject 0.5 mg into the skin once a week. 1.5 mL 0  . tinidazole (TINDAMAX) 500 MG tablet Take 4 tablets today and 4 tablets tomorrow, no alcohol 8 tablet 0  . triamterene-hydrochlorothiazide (MAXZIDE-25) 37.5-25 MG tablet TAKE ONE  TABLET BY MOUTH DAILY 30 tablet 1   No current facility-administered medications for this visit.   Allergies: Dilaudid [hydromorphone hcl] and Prozac [fluoxetine hcl]  No LMP recorded. Patient has had a hysterectomy.  Past medical history,surgical history, problem list, medications, allergies, family history and social history were all reviewed and documented as reviewed in the EPIC chart.  ROS:  Feeling well. No dyspnea or chest pain on exertion.  No abdominal pain, change in bowel habits, black or bloody stools.  No urinary tract symptoms. GYN ROS: no abnormal bleeding, pelvic pain or discharge, No neurological complaints.   OBJECTIVE:  BP 134/82  The patient appears well, alert, oriented x 3, in no distress. PELVIC EXAM: VULVA: normal appearing vulva with no masses, asymmetry of labia minora tissue noted with the right labia minora 4-5 cm beyond the left labia minora.  The tissue was normal without ulceration or skin changes.  Tenderness or lesions, VAGINA: Distal portion is normal appearing vagina with normal color and discharge, no lesions Chaperone: Caryn Bee present during the examination  ASSESSMENT:  49 y.o. [id number] with labial asymmetry and here for discussion of labioplasty  PLAN:  Since the patient is experiencing discomfort from the labial asymmetry and the elongated right labia minora, it would be reasonable to pursue a right labioplasty under anesthesia in the OR.  General risks with a surgical procedure include infection and bleeding and anesthesia risks.  Hematoma formation and long-term pain/loss of sensation is possible, possibly with  discomfort with intercourse.  Damage to surrounding organ structures is possible but unlikely to include bowel and bladder.  She indicates she is not overly concerned with the appearance following the procedure but is aware that the appearance of the right labia may not be symmetrical with the left, particularly depending on healing factors  out of our control.  I emphasized that her diabetes needs to be under at least adequate control with a hemoglobin A1c under 9% for undergoing a minor procedure such as this.  Postoperative recovery expectations are discussed, I would recommend allowing for at least 1 week off work after this procedure to allow for recovery and healing as it may be hard to sit up for prolonged amounts of time.  She indicates understanding of all of the above and would like to proceed with the surgery, so I will have staff contact her to arrange for the procedure.  All questions were answered by the end of the visit.  Joseph Pierini MD 01/01/20

## 2020-01-04 ENCOUNTER — Telehealth: Payer: Self-pay

## 2020-01-04 NOTE — Telephone Encounter (Signed)
I called patient to discuss scheduling surgery. We discussed her insurance benefits and her estimated [facility] surgery prepayment due by one week prior to surgery.  Patient wants to wait to schedule as she is expecting to have new insurance soon with her job. She said she will call me when she is ready to schedule.

## 2020-01-09 ENCOUNTER — Ambulatory Visit: Payer: 59 | Admitting: Cardiology

## 2020-01-10 ENCOUNTER — Encounter: Payer: 59 | Admitting: Family Medicine

## 2020-01-13 ENCOUNTER — Other Ambulatory Visit: Payer: Self-pay | Admitting: Family Medicine

## 2020-01-13 DIAGNOSIS — I10 Essential (primary) hypertension: Secondary | ICD-10-CM

## 2020-01-30 ENCOUNTER — Other Ambulatory Visit: Payer: Self-pay | Admitting: Family Medicine

## 2020-01-30 ENCOUNTER — Encounter: Payer: Self-pay | Admitting: Family Medicine

## 2020-01-30 ENCOUNTER — Other Ambulatory Visit: Payer: Self-pay

## 2020-01-30 DIAGNOSIS — I10 Essential (primary) hypertension: Secondary | ICD-10-CM

## 2020-01-31 ENCOUNTER — Ambulatory Visit: Payer: 59 | Admitting: Cardiology

## 2020-01-31 MED ORDER — CLONIDINE HCL 0.1 MG PO TABS: 0.1000 mg | ORAL_TABLET | Freq: Every day | ORAL | 0 refills | Status: DC

## 2020-02-01 NOTE — Telephone Encounter (Signed)
I called the pt and scheduled an appt for 6/7 to arrive at 2:15pm.

## 2020-02-02 ENCOUNTER — Other Ambulatory Visit: Payer: Self-pay

## 2020-02-05 ENCOUNTER — Ambulatory Visit: Payer: 59 | Admitting: Family Medicine

## 2020-02-05 NOTE — Progress Notes (Deleted)
Cardiology Office Note:    Date:  02/05/2020   ID:  Casey Acosta, DOB 13-May-1971, MRN [id number]  PCP:  Billie Ruddy, MD  Cardiologist:  No primary care provider on file.  Electrophysiologist:  None   Referring MD: Edrick Oh, MD   No chief complaint on file. ***  History of Present Illness:    Casey Acosta is a 49 y.o. female with a hx of diabetes, hypertension, hyperlipidemia, CKD stage IIIb anxiety who is referred by Dr. Justin Mend for evaluation of syncope.  She presented to the [facility] ED on 11/19/2019 with altered mental status.  She was visiting her brother and fell off the couch, though does not remember the event.  She woke up in the ambulance.  In the ED, head CT was normal.  Creatinine was elevated (1.9), UA was negative.  At telemedicine visit with her PCP on 11/20/2019, she reported right-sided weakness s and MRI brain without contrast was ordered, which has not been done.  Past Medical History:  Diagnosis Date  . Anemia   . Anxiety   . Anxiety and depression   . Chronic kidney disease   . Colon polyp   . Diabetes mellitus without complication ([place])   . Dysuria   . High risk sexual behavior   . History of syncope   . Hyperlipidemia   . Hypertension   . Internal hemorrhoids   . LVH (left ventricular hypertrophy)   . Malaise and fatigue   . Obesity (BMI 30-39.9)   . Ovarian mass   . Panic disorder   . PCOS (polycystic ovarian syndrome)   . Rectal bleeding   . Shortness of breath   . Surgical menopause   . Vaginal trichomoniasis     Past Surgical History:  Procedure Laterality Date  . ABDOMINAL HYSTERECTOMY     partial  . APPENDECTOMY    . CHOLECYSTECTOMY    . TUBAL LIGATION      Current Medications: No outpatient medications have been marked as taking for the 02/06/20 encounter (Appointment) with Donato Heinz, MD.     Allergies:   Dilaudid [hydromorphone hcl] and Prozac [fluoxetine hcl]   Social History    Socioeconomic History  . Marital status: Significant Other    Spouse name: Not on file  . Number of children: Not on file  . Years of education: Not on file  . Highest education level: Not on file  Occupational History  . Not on file  Tobacco Use  . Smoking status: Never Smoker  . Smokeless tobacco: Never Used  Substance and Sexual Activity  . Alcohol use: Yes    Comment: Occas. wine  . Drug use: No  . Sexual activity: Yes    Birth control/protection: Surgical    Comment: HYST.-1st intercourse 49 yo-More than 5 partners  Other Topics Concern  . Not on file  Social History Narrative  . Not on file   Social Determinants of Health   Financial Resource Strain:   . Difficulty of Paying Living Expenses:   Food Insecurity:   . Worried About Charity fundraiser in the Last Year:   . Arboriculturist in the Last Year:   Transportation Needs:   . Film/video editor (Medical):   Marland Kitchen Lack of Transportation (Non-Medical):   Physical Activity:   . Days of Exercise per Week:   . Minutes of Exercise per Session:   Stress:   . Feeling of Stress :   Social Connections:   .  Frequency of Communication with Friends and Family:   . Frequency of Social Gatherings with Friends and Family:   . Attends Religious Services:   . Active Member of Clubs or Organizations:   . Attends Archivist Meetings:   Marland Kitchen Marital Status:      Family History: The patient's ***family history includes ALS in her father; Anxiety disorder in her mother; Crohn's disease in her brother and sister; Depression in her mother; Diabetes in her mother and sister; Hyperlipidemia in her mother; Hypertension in her mother and sister; Thyroid disease in her mother.  ROS:   Please see the history of present illness.    *** All other systems reviewed and are negative.  EKGs/Labs/Other Studies Reviewed:    The following studies were reviewed today: ***  EKG:  EKG is *** ordered today.  The ekg ordered today  demonstrates ***  Recent Labs: 10/26/2019: BUN 20; Creatinine, Ser 1.98; Hemoglobin 11.8; Platelets 360.0; Potassium 4.5; Sodium 138; TSH 0.79  Recent Lipid Panel    Component Value Date/Time   CHOL 289 (H) 10/26/2019 1044   TRIG (H) 10/26/2019 1044    404.0 Triglyceride is over 400; calculations on Lipids are invalid.   HDL 45.70 10/26/2019 1044   CHOLHDL 6 10/26/2019 1044   VLDL 76.2 (H) 05/20/2018 1002   LDLDIRECT 167.0 10/26/2019 1044    Physical Exam:    VS:  There were no vitals taken for this visit.    Wt Readings from Last 3 Encounters:  11/29/19 192 lb (87.1 kg)  11/03/19 190 lb (86.2 kg)  10/26/19 188 lb (85.3 kg)     GEN: *** Well nourished, well developed in no acute distress HEENT: Normal NECK: No JVD; No carotid bruits LYMPHATICS: No lymphadenopathy CARDIAC: ***RRR, no murmurs, rubs, gallops RESPIRATORY:  Clear to auscultation without rales, wheezing or rhonchi  ABDOMEN: Soft, non-tender, non-distended MUSCULOSKELETAL:  No edema; No deformity  SKIN: Warm and dry NEUROLOGIC:  Alert and oriented x 3 PSYCHIATRIC:  Normal affect   ASSESSMENT:    No diagnosis found. PLAN:    Syncope:  Hypertension: On amlodipine-olmesartan 10-40 mg daily, clonidine 0.1 mg daily, Lasix 20 mg daily, Toprol-XL 100 mg daily, triamterene hydrochlorothiazide 37.5-25 mg daily.  Underwent renal artery duplex on 01/01/2020, which showed no evidence of renal artery stenosis. Resistant hypertension: Given resistant hypertension, warrants work-up for secondary causes - Renal duplex  - Sleep study - CMET, TSH, renin/aldosterone - Serum metanephrines - TTE  Hyperlipidemia: On rosuvastatin 10 mg daily  T2DM: On Metformin, Ozempic  CKD stage IIIb: Follows with Dr. Justin Mend in nephrology  RTC in***    Medication Adjustments/Labs and Tests Ordered: Current medicines are reviewed at length with the patient today.  Concerns regarding medicines are outlined above.  No orders of the  defined types were placed in this encounter.  No orders of the defined types were placed in this encounter.   There are no Patient Instructions on file for this visit.   Signed, Donato Heinz, MD  02/05/2020 10:00 PM    [place]

## 2020-02-06 ENCOUNTER — Ambulatory Visit: Payer: 59 | Admitting: Cardiology

## 2020-02-09 ENCOUNTER — Ambulatory Visit: Payer: 59 | Admitting: Internal Medicine

## 2020-02-12 ENCOUNTER — Ambulatory Visit: Payer: 59 | Admitting: Family Medicine

## 2020-02-12 DIAGNOSIS — Z0289 Encounter for other administrative examinations: Secondary | ICD-10-CM

## 2020-02-13 ENCOUNTER — Ambulatory Visit: Payer: 59 | Admitting: Neurology

## 2020-02-14 ENCOUNTER — Other Ambulatory Visit: Payer: Self-pay | Admitting: Family Medicine

## 2020-02-14 ENCOUNTER — Encounter: Payer: Self-pay | Admitting: General Practice

## 2020-02-14 DIAGNOSIS — I10 Essential (primary) hypertension: Secondary | ICD-10-CM

## 2020-02-21 ENCOUNTER — Ambulatory Visit: Payer: 59 | Admitting: Neurology

## 2020-03-01 ENCOUNTER — Ambulatory Visit (INDEPENDENT_AMBULATORY_CARE_PROVIDER_SITE_OTHER): Payer: 59 | Admitting: Internal Medicine

## 2020-03-01 DIAGNOSIS — Z5329 Procedure and treatment not carried out because of patient's decision for other reasons: Secondary | ICD-10-CM

## 2020-03-01 NOTE — Progress Notes (Signed)
Patient ID: Casey Acosta, female   DOB: [birth year], 49 y.o.   MRN: [id number]   NO SHOW  HPI: Casey Acosta is a 49 y.o.-year-old female, initially referred by her PCP, Dr. Glendon Axe, returning for follow-up for DM2, dx in 2010, insulin-dependent, uncontrolled, with complications (CKD).  Last visit 3.5 months ago:  Since last visit, she was admitted in 10/2019 with altered mental status - ?cause.  Reviewed HbA1c levels: Lab Results  Component Value Date   HGBA1C 9.1 (H) 10/26/2019   HGBA1C 8.3 (A) 08/04/2018   HGBA1C 12.2 (A) 04/22/2018   HGBA1C 14.9 (H) 02/24/2018   She was on: - Levemir 10 >> 15 units at bedtime -started 02/24/2018 She has been Metformin >> tolerated this well, but stopped 2/2 CKD. Had diarrhea with the regular formulation  She tried [place] >> tolerated this well, but not covered by her insurance.  She is now on: - Metformin ER 500 mg 2x a day with meals >> IR 500 mg with dinner - Ozempic 0.5 mg weekly in a.m.  - samples >> off since we did not have samples Previously on Levemir 25 units daily but only taking it 3 times a week, so we stopped.  Pt checks her sugars 1-2 times a day: - am: 300s >> 95-134 >> 130-140, 182 (soda) >> 98, 99 - 2h after b'fast: n/c - before lunch: n/c >> 111-120 >> n/c - 2h after lunch: n/c - before dinner: n/c >> 110-120 >> >> 160-170 - 2h after dinner: 300s >> 140-180 >> 99-160 >> n/c - bedtime: n/c - nighttime: n/c Lowest sugar was 200 >> 95 >> 99 >> 98. Highest sugar was 542 >> 200 >> 182 >> 200.  Glucometer: ReliOn  Pt's meals are: - Breakfast: boiled egg + bacon + toast; bisquit ([employer]) - Lunch: salad, chicken salad + croissant - Dinner: may skip - Snacks: chips, crackers  -+ CKD, last BUN/creatinine:  Lab Results  Component Value Date   BUN 20 10/26/2019   BUN 18 05/20/2018   CREATININE 1.98 (H) 10/26/2019   CREATININE 1.34 (H) 05/20/2018  On olmesartan.  -+ HL; last set of lipids: Lab Results   Component Value Date   CHOL 289 (H) 10/26/2019   HDL 45.70 10/26/2019   LDLDIRECT 167.0 10/26/2019   TRIG (H) 10/26/2019    404.0 Triglyceride is over 400; calculations on Lipids are invalid.   CHOLHDL 6 10/26/2019  She was previously on Lipitor 40 but switched to Crestor 10 before our last visit.  - last eye exam was in 2021: No DR  -no numbness and tingling in her feet.  Pt has FH of DM in mother and sister.  ROS: Constitutional: no weight gain/no weight loss, no fatigue, no subjective hyperthermia, no subjective hypothermia Eyes: no blurry vision, no xerophthalmia ENT: no sore throat, no nodules palpated in neck, no dysphagia, no odynophagia, no hoarseness Cardiovascular: no CP/no SOB/no palpitations/no leg swelling Respiratory: no cough/no SOB/no wheezing Gastrointestinal: no N/no V/no D/no C/no acid reflux Musculoskeletal: no muscle aches/no joint aches Skin: no rashes, no hair loss Neurological: no tremors/no numbness/no tingling/no dizziness  I reviewed pt's medications, allergies, PMH, social hx, family hx, and changes were documented in the history of present illness. Otherwise, unchanged from my initial visit note.  Past Medical History:  Diagnosis Date  . Anemia   . Anxiety   . Anxiety and depression   . Chronic kidney disease   . Colon polyp   . Diabetes mellitus without complication ([place])   .  Dysuria   . High risk sexual behavior   . History of syncope   . Hyperlipidemia   . Hypertension   . Internal hemorrhoids   . LVH (left ventricular hypertrophy)   . Malaise and fatigue   . Obesity (BMI 30-39.9)   . Ovarian mass   . Panic disorder   . PCOS (polycystic ovarian syndrome)   . Rectal bleeding   . Shortness of breath   . Surgical menopause   . Vaginal trichomoniasis    Past Surgical History:  Procedure Laterality Date  . ABDOMINAL HYSTERECTOMY     partial  . APPENDECTOMY    . CHOLECYSTECTOMY    . TUBAL LIGATION     Social History    Socioeconomic History  . Marital status: Single    Spouse name: Not on file  . Number of children: 2  . Years of education: Not on file  . Highest education level: Not on file  Occupational History  . nurse  Social Needs  . Financial resource strain: Not on file  . Food insecurity:    Worry: Not on file    Inability: Not on file  . Transportation needs:    Medical: Not on file    Non-medical: Not on file  Tobacco Use  . Smoking status: Never Smoker  . Smokeless tobacco: Never Used  Substance and Sexual Activity  . Alcohol use:  Occasional 1 glass of wine  . Drug use: No   Current Outpatient Medications on File Prior to Visit  Medication Sig Dispense Refill  . amLODipine-olmesartan (AZOR) 10-40 MG tablet TAKE ONE TABLET BY MOUTH DAILY 30 tablet 1  . aspirin EC 81 MG tablet Take 81 mg by mouth daily.    . blood glucose meter kit and supplies KIT Dispense based on patient and insurance preference. Use up to four times daily as directed. 1 each 0  . cloNIDine (CATAPRES) 0.1 MG tablet Take 1 tablet (0.1 mg total) by mouth daily. 90 tablet 0  . furosemide (LASIX) 20 MG tablet Take 20 mg by mouth.    . hydrOXYzine (ATARAX/VISTARIL) 50 MG tablet Take 1 tablet (50 mg total) by mouth 3 (three) times daily as needed. 60 tablet 1  . metFORMIN (GLUCOPHAGE) 500 MG tablet Take 1 tablet (500 mg total) by mouth daily with breakfast. 90 tablet 3  . metoprolol succinate (TOPROL-XL) 100 MG 24 hr tablet TAKE ONE TABLET BY MOUTH DAILY WITH OR IMMEDIATELY FOLLOWING A MEAL 90 tablet 0  . rosuvastatin (CRESTOR) 10 MG tablet Take 1 tablet (10 mg total) by mouth daily. 30 tablet 3  . Semaglutide,0.25 or 0.'5MG'$ /DOS, (OZEMPIC, 0.25 OR 0.5 MG/DOSE,) 2 MG/1.5ML SOPN Inject 0.5 mg into the skin once a week. 1.5 mL 0  . tinidazole (TINDAMAX) 500 MG tablet Take 4 tablets today and 4 tablets tomorrow, no alcohol 8 tablet 0  . triamterene-hydrochlorothiazide (MAXZIDE-25) 37.5-25 MG tablet TAKE ONE TABLET BY MOUTH  DAILY 30 tablet 1   No current facility-administered medications on file prior to visit.   Also, Levemir, 15 units at bedtime.  Allergies  Allergen Reactions  . Dilaudid [Hydromorphone Hcl] Nausea And Vomiting  . Prozac [Fluoxetine Hcl]     hallucinations   Family History  Problem Relation Age of Onset  . Anxiety disorder Mother   . Hyperlipidemia Mother   . Depression Mother   . Hypertension Mother   . Diabetes Mother   . Thyroid disease Mother   . Hypertension Sister   . Diabetes  Sister   . ALS Father   . Crohn's disease Brother   . Crohn's disease Sister    PE: There were no vitals taken for this visit. Wt Readings from Last 3 Encounters:  11/29/19 192 lb (87.1 kg)  11/03/19 190 lb (86.2 kg)  10/26/19 188 lb (85.3 kg)   Constitutional: overweight, in NAD Eyes: PERRLA, EOMI, no exophthalmos ENT: moist mucous membranes, no thyromegaly, no cervical lymphadenopathy Cardiovascular: RRR, No MRG Respiratory: CTA B Gastrointestinal: abdomen soft, NT, ND, BS+ Musculoskeletal: no deformities, strength intact in all 4 Skin: moist, warm, no rashes Neurological: no tremor with outstretched hands, DTR normal in all 4  ASSESSMENT: 1. DM2, insulin-dependent, uncontrolled, with long-term complications - CKD  2.  Hyperlipidemia  3.  Obesity class I  PLAN:  1. Patient with longstanding, uncontrolled, type 2 diabetes, previously on long-acting insulin, to which we added Metformin and GLP-1 receptor agonist.  Hb improved significantly from 12.2% to 8.3%.  We are using a lower Metformin dose due to decreased kidney function.  Unfortunately, she realized on Ozempic samples and she was recently out of this since we did not have any samples... I did advise her at last visit to get into the patient assistance program from [employer] to get Ozempic for free.  Latest HbA1c was higher, at 9.1% at last visit.  Unfortunately, we do not have many options for treatment since she has  CKD. -  - I suggested to:  Patient Instructions  Please continue: - Metformin ER 500 mg daily at dinnertime - Ozempic 0.5 mg weekly in a.m.   Please return in 3 months with your sugar log.   - we checked her HbA1c: 7%  - advised to check sugars at different times of the day - 1x a day, rotating check times - advised for yearly eye exams >> she is UTD - return to clinic in 3 months  2. HL -Reviewed latest lipid panel from 10/2019: Triglycerides and LDL very high: Lab Results  Component Value Date   CHOL 289 (H) 10/26/2019   HDL 45.70 10/26/2019   LDLDIRECT 167.0 10/26/2019   TRIG (H) 10/26/2019    404.0 Triglyceride is over 400; calculations on Lipids are invalid.   CHOLHDL 6 10/26/2019  -Continue the statin (Crestor 10) without side effects  3.  Obesity class I -We restarted Ozempic at last visit, which should also help with weight loss -  Philemon Kingdom, MD PhD [facility] Endocrinology

## 2020-03-01 NOTE — Patient Instructions (Signed)
Please continue: - Metformin ER 500 mg daily at dinnertime - Ozempic 0.5 mg weekly in a.m.   Please return in 3 months with your sugar log.

## 2020-03-05 ENCOUNTER — Encounter: Payer: Self-pay | Admitting: Internal Medicine

## 2020-03-08 ENCOUNTER — Telehealth: Payer: Self-pay | Admitting: Internal Medicine

## 2020-03-08 NOTE — Telephone Encounter (Signed)
Most welcome! Now you are booked out until end of August unless I can place her in an acute slot sooner? Any suggestions or is it ok for her to wait that long?

## 2020-03-08 NOTE — Telephone Encounter (Signed)
Casey Acosta, Thank you for checking with me about this! In her case, I would like to give her another chance.  If she does not come for the next appointment, we will need to discharge her. C

## 2020-03-08 NOTE — Telephone Encounter (Signed)
She will need to wait or be placed on the cancellation list

## 2020-03-08 NOTE — Telephone Encounter (Signed)
Patient has cancelled or NS the last 3 appts she has had on the schedule for you . She was on call during the last appt and did no show but wants to reschedule. Am I ok to do so?

## 2020-03-15 ENCOUNTER — Ambulatory Visit: Payer: 59 | Admitting: Neurology

## 2020-04-29 ENCOUNTER — Ambulatory Visit: Payer: 59 | Admitting: Family Medicine

## 2020-04-29 DIAGNOSIS — Z0289 Encounter for other administrative examinations: Secondary | ICD-10-CM

## 2020-05-08 ENCOUNTER — Other Ambulatory Visit: Payer: Self-pay | Admitting: Family Medicine

## 2020-05-08 DIAGNOSIS — Z1231 Encounter for screening mammogram for malignant neoplasm of breast: Secondary | ICD-10-CM

## 2020-05-21 ENCOUNTER — Ambulatory Visit: Payer: 59

## 2020-05-23 ENCOUNTER — Emergency Department (HOSPITAL_COMMUNITY): Payer: 59

## 2020-05-23 ENCOUNTER — Other Ambulatory Visit: Payer: Self-pay

## 2020-05-23 ENCOUNTER — Other Ambulatory Visit: Payer: Self-pay | Admitting: Family Medicine

## 2020-05-23 ENCOUNTER — Inpatient Hospital Stay (HOSPITAL_COMMUNITY)
Admission: EM | Admit: 2020-05-23 | Discharge: 2020-05-28 | DRG: 305 | Disposition: A | Discharge status: Home / Self Care | Payer: 59 | Attending: Internal Medicine | Admitting: Internal Medicine

## 2020-05-23 ENCOUNTER — Encounter (HOSPITAL_COMMUNITY): Payer: Self-pay

## 2020-05-23 DIAGNOSIS — E669 Obesity, unspecified: Secondary | ICD-10-CM | POA: Diagnosis present

## 2020-05-23 DIAGNOSIS — E1169 Type 2 diabetes mellitus with other specified complication: Secondary | ICD-10-CM | POA: Diagnosis present

## 2020-05-23 DIAGNOSIS — Z9114 Patient's other noncompliance with medication regimen: Secondary | ICD-10-CM

## 2020-05-23 DIAGNOSIS — Z8249 Family history of ischemic heart disease and other diseases of the circulatory system: Secondary | ICD-10-CM

## 2020-05-23 DIAGNOSIS — Z86010 Personal history of colonic polyps: Secondary | ICD-10-CM

## 2020-05-23 DIAGNOSIS — F410 Panic disorder [episodic paroxysmal anxiety] without agoraphobia: Secondary | ICD-10-CM | POA: Diagnosis present

## 2020-05-23 DIAGNOSIS — E1165 Type 2 diabetes mellitus with hyperglycemia: Secondary | ICD-10-CM | POA: Diagnosis present

## 2020-05-23 DIAGNOSIS — Z7984 Long term (current) use of oral hypoglycemic drugs: Secondary | ICD-10-CM

## 2020-05-23 DIAGNOSIS — E785 Hyperlipidemia, unspecified: Secondary | ICD-10-CM | POA: Diagnosis present

## 2020-05-23 DIAGNOSIS — D631 Anemia in chronic kidney disease: Secondary | ICD-10-CM | POA: Diagnosis present

## 2020-05-23 DIAGNOSIS — Z6832 Body mass index (BMI) 32.0-32.9, adult: Secondary | ICD-10-CM

## 2020-05-23 DIAGNOSIS — I158 Other secondary hypertension: Secondary | ICD-10-CM

## 2020-05-23 DIAGNOSIS — I200 Unstable angina: Secondary | ICD-10-CM | POA: Diagnosis present

## 2020-05-23 DIAGNOSIS — N184 Chronic kidney disease, stage 4 (severe): Secondary | ICD-10-CM | POA: Diagnosis present

## 2020-05-23 DIAGNOSIS — Z79899 Other long term (current) drug therapy: Secondary | ICD-10-CM

## 2020-05-23 DIAGNOSIS — I161 Hypertensive emergency: Principal | ICD-10-CM | POA: Diagnosis present

## 2020-05-23 DIAGNOSIS — E871 Hypo-osmolality and hyponatremia: Secondary | ICD-10-CM | POA: Diagnosis present

## 2020-05-23 DIAGNOSIS — R079 Chest pain, unspecified: Secondary | ICD-10-CM | POA: Diagnosis present

## 2020-05-23 DIAGNOSIS — E282 Polycystic ovarian syndrome: Secondary | ICD-10-CM | POA: Diagnosis present

## 2020-05-23 DIAGNOSIS — Z90711 Acquired absence of uterus with remaining cervical stump: Secondary | ICD-10-CM

## 2020-05-23 DIAGNOSIS — Z885 Allergy status to narcotic agent status: Secondary | ICD-10-CM

## 2020-05-23 DIAGNOSIS — N1832 Chronic kidney disease, stage 3b: Secondary | ICD-10-CM | POA: Diagnosis present

## 2020-05-23 DIAGNOSIS — Z888 Allergy status to other drugs, medicaments and biological substances status: Secondary | ICD-10-CM

## 2020-05-23 DIAGNOSIS — Z20822 Contact with and (suspected) exposure to covid-19: Secondary | ICD-10-CM | POA: Diagnosis present

## 2020-05-23 DIAGNOSIS — F419 Anxiety disorder, unspecified: Secondary | ICD-10-CM | POA: Diagnosis present

## 2020-05-23 DIAGNOSIS — R0789 Other chest pain: Secondary | ICD-10-CM | POA: Diagnosis not present

## 2020-05-23 DIAGNOSIS — E1122 Type 2 diabetes mellitus with diabetic chronic kidney disease: Secondary | ICD-10-CM | POA: Diagnosis present

## 2020-05-23 DIAGNOSIS — I129 Hypertensive chronic kidney disease with stage 1 through stage 4 chronic kidney disease, or unspecified chronic kidney disease: Secondary | ICD-10-CM | POA: Diagnosis present

## 2020-05-23 DIAGNOSIS — Z7982 Long term (current) use of aspirin: Secondary | ICD-10-CM

## 2020-05-23 DIAGNOSIS — Z83438 Family history of other disorder of lipoprotein metabolism and other lipidemia: Secondary | ICD-10-CM

## 2020-05-23 DIAGNOSIS — R778 Other specified abnormalities of plasma proteins: Secondary | ICD-10-CM | POA: Diagnosis present

## 2020-05-23 DIAGNOSIS — E782 Mixed hyperlipidemia: Secondary | ICD-10-CM | POA: Diagnosis present

## 2020-05-23 DIAGNOSIS — Z9119 Patient's noncompliance with other medical treatment and regimen: Secondary | ICD-10-CM

## 2020-05-23 DIAGNOSIS — Z833 Family history of diabetes mellitus: Secondary | ICD-10-CM

## 2020-05-23 DIAGNOSIS — Z818 Family history of other mental and behavioral disorders: Secondary | ICD-10-CM

## 2020-05-23 DIAGNOSIS — I160 Hypertensive urgency: Secondary | ICD-10-CM | POA: Diagnosis present

## 2020-05-23 DIAGNOSIS — I10 Essential (primary) hypertension: Secondary | ICD-10-CM

## 2020-05-23 NOTE — ED Triage Notes (Signed)
Patient arrived stating that she has had intermittent central chest pain and shortness of breath with exertion over the last few weeks. States she had a negative covid-19 test today.

## 2020-05-24 ENCOUNTER — Encounter (HOSPITAL_COMMUNITY): Payer: Self-pay | Admitting: Internal Medicine

## 2020-05-24 ENCOUNTER — Observation Stay (HOSPITAL_COMMUNITY): Payer: 59

## 2020-05-24 ENCOUNTER — Other Ambulatory Visit: Payer: Self-pay

## 2020-05-24 DIAGNOSIS — F410 Panic disorder [episodic paroxysmal anxiety] without agoraphobia: Secondary | ICD-10-CM | POA: Diagnosis present

## 2020-05-24 DIAGNOSIS — N1831 Chronic kidney disease, stage 3a: Secondary | ICD-10-CM | POA: Diagnosis not present

## 2020-05-24 DIAGNOSIS — R55 Syncope and collapse: Secondary | ICD-10-CM | POA: Diagnosis not present

## 2020-05-24 DIAGNOSIS — E282 Polycystic ovarian syndrome: Secondary | ICD-10-CM | POA: Diagnosis present

## 2020-05-24 DIAGNOSIS — E669 Obesity, unspecified: Secondary | ICD-10-CM | POA: Diagnosis present

## 2020-05-24 DIAGNOSIS — E1169 Type 2 diabetes mellitus with other specified complication: Secondary | ICD-10-CM

## 2020-05-24 DIAGNOSIS — I161 Hypertensive emergency: Secondary | ICD-10-CM

## 2020-05-24 DIAGNOSIS — R778 Other specified abnormalities of plasma proteins: Secondary | ICD-10-CM | POA: Diagnosis present

## 2020-05-24 DIAGNOSIS — I160 Hypertensive urgency: Secondary | ICD-10-CM | POA: Diagnosis present

## 2020-05-24 DIAGNOSIS — E1121 Type 2 diabetes mellitus with diabetic nephropathy: Secondary | ICD-10-CM

## 2020-05-24 DIAGNOSIS — D151 Benign neoplasm of heart: Secondary | ICD-10-CM | POA: Diagnosis not present

## 2020-05-24 DIAGNOSIS — I200 Unstable angina: Secondary | ICD-10-CM

## 2020-05-24 DIAGNOSIS — R079 Chest pain, unspecified: Secondary | ICD-10-CM | POA: Diagnosis present

## 2020-05-24 DIAGNOSIS — I358 Other nonrheumatic aortic valve disorders: Secondary | ICD-10-CM | POA: Diagnosis not present

## 2020-05-24 DIAGNOSIS — E785 Hyperlipidemia, unspecified: Secondary | ICD-10-CM | POA: Diagnosis present

## 2020-05-24 DIAGNOSIS — I428 Other cardiomyopathies: Secondary | ICD-10-CM | POA: Diagnosis not present

## 2020-05-24 DIAGNOSIS — Z833 Family history of diabetes mellitus: Secondary | ICD-10-CM | POA: Diagnosis not present

## 2020-05-24 DIAGNOSIS — E782 Mixed hyperlipidemia: Secondary | ICD-10-CM | POA: Diagnosis present

## 2020-05-24 DIAGNOSIS — D631 Anemia in chronic kidney disease: Secondary | ICD-10-CM | POA: Diagnosis present

## 2020-05-24 DIAGNOSIS — Z8249 Family history of ischemic heart disease and other diseases of the circulatory system: Secondary | ICD-10-CM | POA: Diagnosis not present

## 2020-05-24 DIAGNOSIS — E1122 Type 2 diabetes mellitus with diabetic chronic kidney disease: Secondary | ICD-10-CM | POA: Diagnosis present

## 2020-05-24 DIAGNOSIS — I129 Hypertensive chronic kidney disease with stage 1 through stage 4 chronic kidney disease, or unspecified chronic kidney disease: Secondary | ICD-10-CM | POA: Diagnosis present

## 2020-05-24 DIAGNOSIS — Z9119 Patient's noncompliance with other medical treatment and regimen: Secondary | ICD-10-CM | POA: Diagnosis not present

## 2020-05-24 DIAGNOSIS — Z20822 Contact with and (suspected) exposure to covid-19: Secondary | ICD-10-CM | POA: Diagnosis present

## 2020-05-24 DIAGNOSIS — Z6832 Body mass index (BMI) 32.0-32.9, adult: Secondary | ICD-10-CM | POA: Diagnosis not present

## 2020-05-24 DIAGNOSIS — Z818 Family history of other mental and behavioral disorders: Secondary | ICD-10-CM | POA: Diagnosis not present

## 2020-05-24 DIAGNOSIS — Z90711 Acquired absence of uterus with remaining cervical stump: Secondary | ICD-10-CM | POA: Diagnosis not present

## 2020-05-24 DIAGNOSIS — I10 Essential (primary) hypertension: Secondary | ICD-10-CM

## 2020-05-24 DIAGNOSIS — Z9114 Patient's other noncompliance with medication regimen: Secondary | ICD-10-CM | POA: Diagnosis not present

## 2020-05-24 DIAGNOSIS — E1165 Type 2 diabetes mellitus with hyperglycemia: Secondary | ICD-10-CM

## 2020-05-24 DIAGNOSIS — R0600 Dyspnea, unspecified: Secondary | ICD-10-CM | POA: Diagnosis not present

## 2020-05-24 DIAGNOSIS — E871 Hypo-osmolality and hyponatremia: Secondary | ICD-10-CM | POA: Diagnosis present

## 2020-05-24 DIAGNOSIS — N1832 Chronic kidney disease, stage 3b: Secondary | ICD-10-CM | POA: Diagnosis present

## 2020-05-24 DIAGNOSIS — R0789 Other chest pain: Secondary | ICD-10-CM | POA: Diagnosis present

## 2020-05-24 DIAGNOSIS — F419 Anxiety disorder, unspecified: Secondary | ICD-10-CM | POA: Diagnosis present

## 2020-05-24 DIAGNOSIS — Z86010 Personal history of colonic polyps: Secondary | ICD-10-CM | POA: Diagnosis not present

## 2020-05-24 LAB — BASIC METABOLIC PANEL
Anion gap: 10 (ref 5–15)
Anion gap: 10 (ref 5–15)
BUN: 19 mg/dL (ref 6–20)
BUN: 23 mg/dL — ABNORMAL HIGH (ref 6–20)
CO2: 25 mmol/L (ref 22–32)
CO2: 25 mmol/L (ref 22–32)
Calcium: 9 mg/dL (ref 8.9–10.3)
Calcium: 9.4 mg/dL (ref 8.9–10.3)
Chloride: 100 mmol/L (ref 98–111)
Chloride: 99 mmol/L (ref 98–111)
Creatinine, Ser: 1.6 mg/dL — ABNORMAL HIGH (ref 0.44–1.00)
Creatinine, Ser: 1.78 mg/dL — ABNORMAL HIGH (ref 0.44–1.00)
GFR calc Af Amer: 38 mL/min — ABNORMAL LOW (ref >60)
GFR calc Af Amer: 43 mL/min — ABNORMAL LOW (ref >60)
GFR calc non Af Amer: 33 mL/min — ABNORMAL LOW (ref >60)
GFR calc non Af Amer: 37 mL/min — ABNORMAL LOW (ref >60)
Glucose, Bld: 197 mg/dL — ABNORMAL HIGH (ref 70–99)
Glucose, Bld: 262 mg/dL — ABNORMAL HIGH (ref 70–99)
Potassium: 3.5 mmol/L (ref 3.5–5.1)
Potassium: 3.8 mmol/L (ref 3.5–5.1)
Sodium: 134 mmol/L — ABNORMAL LOW (ref 135–145)
Sodium: 135 mmol/L (ref 135–145)

## 2020-05-24 LAB — GLUCOSE, CAPILLARY
Glucose-Capillary: 183 mg/dL — ABNORMAL HIGH (ref 70–99)
Glucose-Capillary: 221 mg/dL — ABNORMAL HIGH (ref 70–99)
Glucose-Capillary: 252 mg/dL — ABNORMAL HIGH (ref 70–99)

## 2020-05-24 LAB — HEMOGLOBIN A1C
Hgb A1c MFr Bld: 10.8 % — ABNORMAL HIGH (ref 4.8–5.6)
Mean Plasma Glucose: 263.26 mg/dL

## 2020-05-24 LAB — TROPONIN I (HIGH SENSITIVITY)
Troponin I (High Sensitivity): 32 ng/L — ABNORMAL HIGH (ref <18)
Troponin I (High Sensitivity): 32 ng/L — ABNORMAL HIGH (ref <18)
Troponin I (High Sensitivity): 34 ng/L — ABNORMAL HIGH (ref <18)

## 2020-05-24 LAB — ECHOCARDIOGRAM COMPLETE
Area-P 1/2: 3.21 cm2
Height: 66 in
S' Lateral: 2.7 cm
Weight: 3200 oz

## 2020-05-24 LAB — RESPIRATORY PANEL BY RT PCR (FLU A&B, COVID)
Influenza A by PCR: NEGATIVE
Influenza B by PCR: NEGATIVE
SARS Coronavirus 2 by RT PCR: NEGATIVE

## 2020-05-24 LAB — LIPID PANEL
Cholesterol: 222 mg/dL — ABNORMAL HIGH (ref 0–200)
HDL: 35 mg/dL — ABNORMAL LOW (ref >40)
LDL Cholesterol: 121 mg/dL — ABNORMAL HIGH (ref 0–99)
Total CHOL/HDL Ratio: 6.3 RATIO
Triglycerides: 329 mg/dL — ABNORMAL HIGH (ref <150)
VLDL: 66 mg/dL — ABNORMAL HIGH (ref 0–40)

## 2020-05-24 LAB — CBC
HCT: 31.3 % — ABNORMAL LOW (ref 36.0–46.0)
HCT: 32.1 % — ABNORMAL LOW (ref 36.0–46.0)
Hemoglobin: 10.5 g/dL — ABNORMAL LOW (ref 12.0–15.0)
Hemoglobin: 10.6 g/dL — ABNORMAL LOW (ref 12.0–15.0)
MCH: 26.7 pg (ref 26.0–34.0)
MCH: 27.1 pg (ref 26.0–34.0)
MCHC: 33 g/dL (ref 30.0–36.0)
MCHC: 33.5 g/dL (ref 30.0–36.0)
MCV: 80.9 fL (ref 80.0–100.0)
MCV: 80.9 fL (ref 80.0–100.0)
Platelets: 305 10*3/uL (ref 150–400)
Platelets: 366 10*3/uL (ref 150–400)
RBC: 3.87 MIL/uL (ref 3.87–5.11)
RBC: 3.97 MIL/uL (ref 3.87–5.11)
RDW: 14 % (ref 11.5–15.5)
RDW: 14.2 % (ref 11.5–15.5)
WBC: 10.4 10*3/uL (ref 4.0–10.5)
WBC: 12.4 10*3/uL — ABNORMAL HIGH (ref 4.0–10.5)
nRBC: 0 % (ref 0.0–0.2)
nRBC: 0 % (ref 0.0–0.2)

## 2020-05-24 LAB — CBG MONITORING, ED
Glucose-Capillary: 216 mg/dL — ABNORMAL HIGH (ref 70–99)
Glucose-Capillary: 197 mg/dL — ABNORMAL HIGH (ref 70–99)

## 2020-05-24 LAB — BRAIN NATRIURETIC PEPTIDE: B Natriuretic Peptide: 166.1 pg/mL — ABNORMAL HIGH (ref 0.0–100.0)

## 2020-05-24 LAB — HIV ANTIBODY (ROUTINE TESTING W REFLEX): HIV Screen 4th Generation wRfx: NONREACTIVE

## 2020-05-24 LAB — I-STAT BETA HCG BLOOD, ED (NOT ORDERABLE): I-stat hCG, quantitative: <5 m[IU]/mL (ref <5)

## 2020-05-24 MED ORDER — NITROGLYCERIN 2 % TD OINT
0.5000 [in_us] | TOPICAL_OINTMENT | Freq: Once | TRANSDERMAL | Status: AC
  Administered 2020-05-24: 0.5 [in_us] via TOPICAL
  Filled 2020-05-24: qty 1

## 2020-05-24 MED ORDER — SODIUM CHLORIDE 0.9 % IV SOLN
INTRAVENOUS | Status: DC | PRN
  Administered 2020-05-24: 250 mL via INTRAVENOUS

## 2020-05-24 MED ORDER — ENALAPRILAT 1.25 MG/ML IV SOLN
1.2500 mg | Freq: Once | INTRAVENOUS | Status: AC
  Administered 2020-05-24: 1.25 mg via INTRAVENOUS
  Filled 2020-05-24: qty 1

## 2020-05-24 MED ORDER — INSULIN ASPART 100 UNIT/ML ~~LOC~~ SOLN
0.0000 [IU] | Freq: Three times a day (TID) | SUBCUTANEOUS | Status: DC
  Administered 2020-05-24: 5 [IU] via SUBCUTANEOUS
  Administered 2020-05-24: 8 [IU] via SUBCUTANEOUS
  Administered 2020-05-24 (×2): 3 [IU] via SUBCUTANEOUS
  Administered 2020-05-25: 11 [IU] via SUBCUTANEOUS
  Administered 2020-05-25: 8 [IU] via SUBCUTANEOUS
  Administered 2020-05-25 (×2): 3 [IU] via SUBCUTANEOUS
  Administered 2020-05-26 (×3): 5 [IU] via SUBCUTANEOUS
  Administered 2020-05-26 – 2020-05-27 (×2): 3 [IU] via SUBCUTANEOUS
  Administered 2020-05-27: 11 [IU] via SUBCUTANEOUS
  Administered 2020-05-27: 2 [IU] via SUBCUTANEOUS
  Administered 2020-05-28: 5 [IU] via SUBCUTANEOUS
  Administered 2020-05-28: 3 [IU] via SUBCUTANEOUS
  Filled 2020-05-24: qty 0.15

## 2020-05-24 MED ORDER — HYDRALAZINE HCL 20 MG/ML IJ SOLN
10.0000 mg | Freq: Four times a day (QID) | INTRAMUSCULAR | Status: DC | PRN
  Administered 2020-05-24 – 2020-05-26 (×3): 10 mg via INTRAVENOUS
  Filled 2020-05-24 (×2): qty 1

## 2020-05-24 MED ORDER — ACETAMINOPHEN 325 MG PO TABS
650.0000 mg | ORAL_TABLET | ORAL | Status: DC | PRN
  Administered 2020-05-24 – 2020-05-26 (×5): 650 mg via ORAL
  Filled 2020-05-24 (×5): qty 2

## 2020-05-24 MED ORDER — ENOXAPARIN SODIUM 40 MG/0.4ML ~~LOC~~ SOLN
40.0000 mg | SUBCUTANEOUS | Status: DC
  Administered 2020-05-24 – 2020-05-28 (×5): 40 mg via SUBCUTANEOUS
  Filled 2020-05-24 (×5): qty 0.4

## 2020-05-24 MED ORDER — IRBESARTAN 150 MG PO TABS
300.0000 mg | ORAL_TABLET | Freq: Every day | ORAL | Status: DC
  Administered 2020-05-24 – 2020-05-28 (×5): 300 mg via ORAL
  Filled 2020-05-24 (×4): qty 1
  Filled 2020-05-24 (×2): qty 2
  Filled 2020-05-24: qty 1

## 2020-05-24 MED ORDER — ONDANSETRON HCL 4 MG/2ML IJ SOLN
4.0000 mg | Freq: Four times a day (QID) | INTRAMUSCULAR | Status: DC | PRN
  Administered 2020-05-27: 4 mg via INTRAVENOUS

## 2020-05-24 MED ORDER — METOPROLOL SUCCINATE ER 100 MG PO TB24
100.0000 mg | ORAL_TABLET | Freq: Every day | ORAL | Status: DC
  Administered 2020-05-24 – 2020-05-25 (×2): 100 mg via ORAL
  Filled 2020-05-24: qty 1
  Filled 2020-05-24: qty 2

## 2020-05-24 MED ORDER — ASPIRIN 325 MG PO TABS
325.0000 mg | ORAL_TABLET | Freq: Every day | ORAL | Status: DC
  Administered 2020-05-24 – 2020-05-28 (×5): 325 mg via ORAL
  Filled 2020-05-24 (×6): qty 1

## 2020-05-24 MED ORDER — ROSUVASTATIN CALCIUM 10 MG PO TABS
10.0000 mg | ORAL_TABLET | Freq: Every day | ORAL | Status: DC
  Filled 2020-05-24: qty 1

## 2020-05-24 MED ORDER — NITROGLYCERIN IN D5W 200-5 MCG/ML-% IV SOLN
0.0000 ug/min | INTRAVENOUS | Status: DC
  Administered 2020-05-24: 5 ug/min via INTRAVENOUS
  Filled 2020-05-24: qty 250

## 2020-05-24 MED ORDER — AMLODIPINE BESYLATE 10 MG PO TABS
10.0000 mg | ORAL_TABLET | Freq: Every day | ORAL | Status: DC
  Administered 2020-05-24 – 2020-05-28 (×5): 10 mg via ORAL
  Filled 2020-05-24 (×3): qty 1
  Filled 2020-05-24: qty 2
  Filled 2020-05-24: qty 1

## 2020-05-24 MED ORDER — ASPIRIN 81 MG PO CHEW
324.0000 mg | CHEWABLE_TABLET | Freq: Once | ORAL | Status: AC
  Administered 2020-05-24: 324 mg via ORAL
  Filled 2020-05-24: qty 4

## 2020-05-24 MED ORDER — HYDROXYZINE HCL 25 MG PO TABS
50.0000 mg | ORAL_TABLET | Freq: Three times a day (TID) | ORAL | Status: DC | PRN
  Administered 2020-05-26 – 2020-05-27 (×2): 50 mg via ORAL
  Filled 2020-05-24 (×2): qty 2

## 2020-05-24 MED ORDER — ROSUVASTATIN CALCIUM 20 MG PO TABS
40.0000 mg | ORAL_TABLET | Freq: Every day | ORAL | Status: DC
  Administered 2020-05-25 – 2020-05-28 (×4): 40 mg via ORAL
  Filled 2020-05-24 (×4): qty 2

## 2020-05-24 MED ORDER — POLYETHYLENE GLYCOL 3350 17 G PO PACK
17.0000 g | PACK | Freq: Every day | ORAL | Status: DC | PRN
  Administered 2020-05-25: 17 g via ORAL
  Filled 2020-05-24: qty 1

## 2020-05-24 MED ORDER — NITROGLYCERIN IN D5W 200-5 MCG/ML-% IV SOLN: 0.0000 ug/min | INTRAVENOUS | Status: DC

## 2020-05-24 MED ORDER — TRIAMTERENE-HCTZ 37.5-25 MG PO TABS
1.0000 | ORAL_TABLET | Freq: Every day | ORAL | Status: DC
  Filled 2020-05-24: qty 1

## 2020-05-24 NOTE — ED Provider Notes (Signed)
[facility] DEPT Provider Note  CSN: [id number] Arrival date & time: 05/23/20 2242  Chief Complaint(s) Chest Pain and Shortness of Breath  HPI Casey Acosta is a 49 y.o. female  with a history of treated diabetes, hypertension, hypercholesterolemia and obesity presents for evaluation of chest pain. Initial onset of pain was several weeks ago and only with exertion or being 'worked up.' The patient's chest pain is worse with exertion. Also has DOE. The patient's chest pain is middle- or left-sided, is not well-localized, is not described as heaviness/pressure/tightness, is not sharp and does not radiate to the arms/jaw/neck. The patient does not complain of nausea and denies diaphoresis. The patient has no history of stroke, has no history of peripheral artery disease, has not smoked in the past 90 days and has no relevant family history of coronary artery disease (first degree relative at less than age 22).   HPI  Past Medical History Past Medical History:  Diagnosis Date  . Anemia   . Anxiety   . Anxiety and depression   . Chronic kidney disease   . Colon polyp   . Diabetes mellitus without complication ([place])   . Dysuria   . High risk sexual behavior   . History of syncope   . Hyperlipidemia   . Hypertension   . Internal hemorrhoids   . LVH (left ventricular hypertrophy)   . Malaise and fatigue   . Obesity (BMI 30-39.9)   . Ovarian mass   . Panic disorder   . PCOS (polycystic ovarian syndrome)   . Rectal bleeding   . Shortness of breath   . Surgical menopause   . Vaginal trichomoniasis    Patient Active Problem List   Diagnosis Date Noted  . Chest pain 05/24/2020  . Preventative health care 01/02/2019  . Dyspnea 01/02/2019  . COVID-19 virus infection 01/02/2019  . Class 1 obesity 11/15/2018  . CKD (chronic kidney disease) stage 3, GFR 30-59 ml/min 05/26/2018  . Essential hypertension 02/23/2018  . Type 2 diabetes mellitus with diabetic  nephropathy, without long-term current use of insulin ([place]) 02/23/2018  . Hyperlipidemia 02/23/2018  . Anxiety and depression 02/23/2018   Home Medication(s) Prior to Admission medications   Medication Sig Start Date End Date Taking? Authorizing Provider  acetaminophen (TYLENOL) 500 MG tablet Take 1,000 mg by mouth every 6 (six) hours as needed for mild pain, moderate pain, fever or headache.   Yes [provider]  amLODipine-olmesartan (AZOR) 10-40 MG tablet TAKE ONE TABLET BY MOUTH DAILY Patient taking differently: Take 1 tablet by mouth daily.  02/15/20  Yes Billie Ruddy, MD  aspirin EC 81 MG tablet Take 81 mg by mouth daily.   Yes [provider]  cloNIDine (CATAPRES) 0.1 MG tablet Take 1 tablet (0.1 mg total) by mouth daily. Patient taking differently: Take 0.1 mg by mouth at bedtime as needed. High Blood pressure Diastolic > 90 12/29/00  Yes Billie Ruddy, MD  hydrOXYzine (ATARAX/VISTARIL) 50 MG tablet Take 1 tablet (50 mg total) by mouth 3 (three) times daily as needed. Patient taking differently: Take 50 mg by mouth 3 (three) times daily as needed for anxiety (sleep).  10/26/19  Yes Billie Ruddy, MD  metFORMIN (GLUCOPHAGE) 500 MG tablet Take 1 tablet (500 mg total) by mouth daily with breakfast. 03/09/19  Yes Biagio Borg, MD  metoprolol succinate (TOPROL-XL) 100 MG 24 hr tablet TAKE ONE TABLET BY MOUTH DAILY WITH OR IMMEDIATELY FOLLOWING A MEAL Patient taking differently:  Take 100 mg by mouth daily.  01/15/20  Yes Billie Ruddy, MD  rosuvastatin (CRESTOR) 10 MG tablet Take 1 tablet (10 mg total) by mouth daily. 10/30/19  Yes Billie Ruddy, MD  Semaglutide,0.25 or 0.5MG /DOS, (OZEMPIC, 0.25 OR 0.5 MG/DOSE,) 2 MG/1.5ML SOPN Inject 0.5 mg into the skin once a week. 10/02/19  Yes Elayne Snare, MD  triamterene-hydrochlorothiazide (MAXZIDE-25) 37.5-25 MG tablet TAKE ONE TABLET BY MOUTH DAILY Patient taking differently: Take 1 tablet by mouth daily.  02/15/20  Yes Billie Ruddy, MD  blood glucose meter kit and supplies KIT Dispense based on patient and insurance preference. Use up to four times daily as directed. 09/20/18   Lance Sell, NP  tinidazole (TINDAMAX) 500 MG tablet Take 4 tablets today and 4 tablets tomorrow, no alcohol Patient not taking: Reported on 05/24/2020 11/29/19   Huel Cote, NP                                                                                                                                    Past Surgical History Past Surgical History:  Procedure Laterality Date  . ABDOMINAL HYSTERECTOMY     partial  . APPENDECTOMY    . CHOLECYSTECTOMY    . TUBAL LIGATION     Family History Family History  Problem Relation Age of Onset  . Anxiety disorder Mother   . Hyperlipidemia Mother   . Depression Mother   . Hypertension Mother   . Diabetes Mother   . Thyroid disease Mother   . Hypertension Sister   . Diabetes Sister   . ALS Father   . Crohn's disease Brother   . Crohn's disease Sister     Social History Social History   Tobacco Use  . Smoking status: Never Smoker  . Smokeless tobacco: Never Used  Vaping Use  . Vaping Use: Never used  Substance Use Topics  . Alcohol use: Yes    Comment: Occas. wine  . Drug use: No   Allergies Dilaudid [hydromorphone] and Prozac [fluoxetine hcl]  Review of Systems Review of Systems All other systems are reviewed and are negative for acute change except as noted in the HPI  Physical Exam Vital Signs  I have reviewed the triage vital signs BP (!) 189/109 (BP Location: Right Arm)   Pulse 79   Temp 98.5 F (36.9 C) (Oral)   Resp 16   SpO2 100%   Physical Exam Vitals reviewed.  Constitutional:      General: Casey Acosta is not in acute distress.    Appearance: Casey Acosta is well-developed. Casey Acosta is not diaphoretic.  HENT:     Head: Normocephalic and atraumatic.     Nose: Nose normal.  Eyes:     General: No scleral icterus.       Right eye: No discharge.        Left  eye: No discharge.     Conjunctiva/sclera: Conjunctivae  normal.     Pupils: Pupils are equal, round, and reactive to light.  Cardiovascular:     Rate and Rhythm: Normal rate and regular rhythm.     Heart sounds: No murmur heard.  No friction rub. No gallop.   Pulmonary:     Effort: Pulmonary effort is normal. No respiratory distress.     Breath sounds: Normal breath sounds. No stridor. No rales.  Abdominal:     General: There is no distension.     Palpations: Abdomen is soft.     Tenderness: There is no abdominal tenderness.  Musculoskeletal:        General: No tenderness.     Cervical back: Normal range of motion and neck supple.     Right lower leg: Edema present.     Left lower leg: Edema present.  Skin:    General: Skin is warm and dry.     Findings: No erythema or rash.  Neurological:     Mental Status: Casey Acosta is alert and oriented to person, place, and time.     ED Results and Treatments Labs (all labs ordered are listed, but only abnormal results are displayed) Labs Reviewed  BASIC METABOLIC PANEL - Abnormal; Notable for the following components:      Result Value   Sodium 134 (*)    Glucose, Bld 262 (*)    BUN 23 (*)    Creatinine, Ser 1.78 (*)    GFR calc non Af Amer 33 (*)    GFR calc Af Amer 38 (*)    All other components within normal limits  CBC - Abnormal; Notable for the following components:   WBC 12.4 (*)    Hemoglobin 10.6 (*)    HCT 32.1 (*)    All other components within normal limits  TROPONIN I (HIGH SENSITIVITY) - Abnormal; Notable for the following components:   Troponin I (High Sensitivity) 34 (*)    All other components within normal limits  RESPIRATORY PANEL BY RT PCR (FLU A&B, COVID)  BRAIN NATRIURETIC PEPTIDE  I-STAT BETA HCG BLOOD, ED ([facility], [facility], [facility] ONLY)                                                                                                                         EKG  EKG Interpretation  Date/Time:  Thursday May 23 2020  23:01:44 EDT Ventricular Rate:  82 PR Interval:    QRS Duration: 87 QT Interval:  381 QTC Calculation: 445 R Axis:     Text Interpretation: Sinus rhythm Left ventricular hypertrophy 12 Lead; [place] NO STEMI. Confirmed by Addison Lank [phone]) on 05/24/2020 1:16:48 AM      Radiology DG Chest 2 View  Result Date: 05/23/2020 CLINICAL DATA:  Chest pain and shortness of breath EXAM: CHEST - 2 VIEW COMPARISON:  06/24/2015 FINDINGS: The heart size and mediastinal contours are within normal limits. Both lungs are clear. The visualized skeletal structures are unremarkable. IMPRESSION: No active cardiopulmonary disease. Electronically Signed  By: Inez Catalina M.D.   On: 05/23/2020 23:37    Pertinent labs & imaging results that were available during my care of the patient were reviewed by me and considered in my medical decision making (see chart for details).  Medications Ordered in ED Medications  aspirin chewable tablet 324 mg (324 mg Oral Given 05/24/20 0233)  nitroGLYCERIN (NITROGLYN) 2 % ointment 0.5 inch (0.5 inches Topical Given 05/24/20 0246)                                                                                                                                    Procedures .1-3 Lead EKG Interpretation Performed by: Fatima Blank, MD Authorized by: Fatima Blank, MD     Interpretation: normal     ECG rate:  80   ECG rate assessment: normal     Rhythm: sinus rhythm     Ectopy: none     Conduction: normal   .Critical Care Performed by: Fatima Blank, MD Authorized by: Fatima Blank, MD    CRITICAL CARE Performed by: Grayce Sessions Lerin Jech Total critical care time: 30 minutes Critical care time was exclusive of separately billable procedures and treating other patients. Critical care was necessary to treat or prevent imminent or life-threatening deterioration. Critical care was time spent personally by me on the following  activities: development of treatment plan with patient and/or surrogate as well as nursing, discussions with consultants, evaluation of patient's response to treatment, examination of patient, obtaining history from patient or surrogate, ordering and performing treatments and interventions, ordering and review of laboratory studies, ordering and review of radiographic studies, pulse oximetry and re-evaluation of patient's condition.   (including critical care time)  Medical Decision Making / ED Course I have reviewed the nursing notes for this encounter and the patient's prior records (if available in EHR or on provided paperwork).   Bedelia Pong was evaluated in Emergency Department on 05/24/2020 for the symptoms described in the history of present illness. Casey Acosta was evaluated in the context of the global COVID-19 pandemic, which necessitated consideration that the patient might be at risk for infection with the SARS-CoV-2 virus that causes COVID-19. Institutional protocols and algorithms that pertain to the evaluation of patients at risk for COVID-19 are in a state of rapid change based on information released by regulatory bodies including the CDC and federal and state organizations. These policies and algorithms were followed during the patient's care in the ED.  Exertional CP and SOB. EKG w/o acute ischemic changes. Initial trop mildly elevated.  HEART score 5.   Low suspicion for pulmonary embolism. Presentation not classic for aortic dissection or esophageal perforation. Chest x-ray without evidence suggestive of pneumonia, pneumothorax, pneumomediastinum.  No abnormal contour of the mediastinum to suggest dissection. No evidence of acute injuries.  Admitted for ACS rule out. Will likely need stress test and ECHO. Given Casey Acosta HTN, suspect diastolic dysfunction.  Final Clinical Impression(s) / ED Diagnoses Final diagnoses:  Exertional chest pain  Elevated troponin  Other  secondary hypertension      This chart was dictated using voice recognition software.  Despite best efforts to proofread,  errors can occur which can change the documentation meaning.   Fatima Blank, MD 05/24/20 [phone]

## 2020-05-24 NOTE — ED Notes (Signed)
ECHO at bedside. Pt remains chest pain free. Denies SHOB. Skin w/d/pink. Resp wnl, equal and non-labored. Cont to monitor.

## 2020-05-24 NOTE — Consult Note (Addendum)
Cardiology Consultation:   Patient ID: Casey Acosta MRN: [id number]; DOB: 1970/12/13  Admit date: 05/23/2020 Date of Consult: 05/24/2020  Primary Care Provider: Billie Ruddy, MD [facility] Cardiologist: Kirk Ruths, MD [facility] Electrophysiologist:  None    Patient Profile:   Casey Acosta is a 49 y.o. female with a hx of HTN, HLD, uncontrolled DM, CKD stage III and medical noncompliance who is being seen today for the evaluation of chest pain at the request of Dr. Karleen Hampshire.  History of Present Illness:   Casey Acosta does not currently follow with cardiology. She has the above medical problems with a history of medication noncompliance and frequently no-shows for appts. Hx of anxiety previously on xanax.   She presented to [facility] with chest pain in the setting of running out of all blood pressure medications several days ago. She was found to be in hypertensive urgency with elevated HS troponin of 34 --> 32.  BNP 166.   On my interview, she reports chest pain over the last month that occurs nearly daily. The chest pain is exertional and associated with SOB. CP subsides when she rests. She has chest pain with walking from her car to the building at work. Last night, she was having anxiety and took hydroxyzine and went to sleep. At 6pm, chest pain woke her from sleep and she describes Levine's sign. CP in the center of her chest without radiation. She reports being out of her BP medication for 2 days, but CP started before this. She is supposed to use clonidine daily, but she states she uses it PRN for hypertension. With further questioning, she is using clonidine 1-2 times daily every day, but timing inconsistent. BP is usually in the 973-532D systolic at home. She denies dyspnea outside of chest pain, orthopnea, lower extremity swelling, headache, and blurry vision. She does snore raising suspicion for sleep apnea.   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 She does not smoke and denies cocaine use. She does not have a family history of heart disease.     Past Medical History:  Diagnosis Date  . Anemia   . Anxiety   . Anxiety and depression   . Chronic kidney disease   . Colon polyp   . Diabetes mellitus without complication ([place])   . Dysuria   . High risk sexual behavior   . History of syncope   . Hyperlipidemia   . Hypertension   . Internal hemorrhoids   . LVH (left ventricular hypertrophy)   . Malaise and fatigue   . Obesity (BMI 30-39.9)   . Ovarian mass   . Panic disorder   . PCOS (polycystic ovarian syndrome)   . Rectal bleeding   . Shortness of breath   . Surgical menopause   . Vaginal trichomoniasis     Past Surgical History:  Procedure Laterality Date  . ABDOMINAL HYSTERECTOMY     partial  . APPENDECTOMY    . CHOLECYSTECTOMY    . TUBAL LIGATION       Home Medications:  Prior to Admission medications   Medication Sig Start Date End Date Taking? Authorizing Provider  acetaminophen (TYLENOL) 500 MG tablet Take 1,000 mg by mouth every 6 (six) hours as needed for mild pain, moderate pain, fever or headache.   Yes [provider]  amLODipine-olmesartan (AZOR) 10-40 MG tablet TAKE ONE TABLET BY MOUTH DAILY Patient taking differently: Take 1 tablet by mouth daily.  02/15/20  Yes  Billie Ruddy, MD  aspirin EC 81 MG tablet Take 81 mg by mouth daily.   Yes [provider]  cloNIDine (CATAPRES) 0.1 MG tablet Take 1 tablet (0.1 mg total) by mouth daily. Patient taking differently: Take 0.1 mg by mouth at bedtime as needed. High Blood pressure Diastolic > 90 09/08/60  Yes Billie Ruddy, MD  hydrOXYzine (ATARAX/VISTARIL) 50 MG tablet Take 1 tablet (50 mg total) by mouth 3 (three) times daily as needed. Patient taking differently: Take 50 mg by mouth 3 (three) times daily as needed for anxiety (sleep).  10/26/19  Yes Billie Ruddy, MD  metFORMIN (GLUCOPHAGE) 500 MG tablet Take 1 tablet (500 mg  total) by mouth daily with breakfast. 03/09/19  Yes Biagio Borg, MD  metoprolol succinate (TOPROL-XL) 100 MG 24 hr tablet TAKE ONE TABLET BY MOUTH DAILY WITH OR IMMEDIATELY FOLLOWING A MEAL Patient taking differently: Take 100 mg by mouth daily.  01/15/20  Yes Billie Ruddy, MD  rosuvastatin (CRESTOR) 10 MG tablet Take 1 tablet (10 mg total) by mouth daily. 10/30/19  Yes Billie Ruddy, MD  Semaglutide,0.25 or 0.5MG/DOS, (OZEMPIC, 0.25 OR 0.5 MG/DOSE,) 2 MG/1.5ML SOPN Inject 0.5 mg into the skin once a week. 10/02/19  Yes Elayne Snare, MD  triamterene-hydrochlorothiazide (MAXZIDE-25) 37.5-25 MG tablet TAKE ONE TABLET BY MOUTH DAILY Patient taking differently: Take 1 tablet by mouth daily.  02/15/20  Yes Billie Ruddy, MD  blood glucose meter kit and supplies KIT Dispense based on patient and insurance preference. Use up to four times daily as directed. 09/20/18   Lance Sell, NP  tinidazole (TINDAMAX) 500 MG tablet Take 4 tablets today and 4 tablets tomorrow, no alcohol Patient not taking: Reported on 05/24/2020 11/29/19   Huel Cote, NP    Inpatient Medications: Scheduled Meds: . amLODipine  10 mg Oral Daily  . aspirin  325 mg Oral Daily  . enoxaparin (LOVENOX) injection  40 mg Subcutaneous Q24H  . insulin aspart  0-15 Units Subcutaneous TID AC & HS  . irbesartan  300 mg Oral Daily  . metoprolol succinate  100 mg Oral Daily  . rosuvastatin  10 mg Oral Daily  . triamterene-hydrochlorothiazide  1 tablet Oral Daily   Continuous Infusions:  PRN Meds: acetaminophen, hydrALAZINE, hydrOXYzine, ondansetron (ZOFRAN) IV, polyethylene glycol  Allergies:    Allergies  Allergen Reactions  . Dilaudid [Hydromorphone] Nausea And Vomiting  . Prozac [Fluoxetine Hcl]     hallucinations    Social History:   Social History   Socioeconomic History  . Marital status: Significant Other    Spouse name: Not on file  . Number of children: Not on file  . Years of education: Not on file  .  Highest education level: Not on file  Occupational History  . Not on file  Tobacco Use  . Smoking status: Never Smoker  . Smokeless tobacco: Never Used  Vaping Use  . Vaping Use: Never used  Substance and Sexual Activity  . Alcohol use: Yes    Comment: Occas. wine  . Drug use: No  . Sexual activity: Yes    Birth control/protection: Surgical    Comment: HYST.-1st intercourse 49 yo-More than 5 partners  Other Topics Concern  . Not on file  Social History Narrative  . Not on file   Social Determinants of Health   Financial Resource Strain:   . Difficulty of Paying Living Expenses: Not on file  Food Insecurity:   . Worried About  Running Out of Food in the Last Year: Not on file  . Ran Out of Food in the Last Year: Not on file  Transportation Needs:   . Lack of Transportation (Medical): Not on file  . Lack of Transportation (Non-Medical): Not on file  Physical Activity:   . Days of Exercise per Week: Not on file  . Minutes of Exercise per Session: Not on file  Stress:   . Feeling of Stress : Not on file  Social Connections:   . Frequency of Communication with Friends and Family: Not on file  . Frequency of Social Gatherings with Friends and Family: Not on file  . Attends Religious Services: Not on file  . Active Member of Clubs or Organizations: Not on file  . Attends Archivist Meetings: Not on file  . Marital Status: Not on file  Intimate Partner Violence:   . Fear of Current or Ex-Partner: Not on file  . Emotionally Abused: Not on file  . Physically Abused: Not on file  . Sexually Abused: Not on file    Family History:    Family History  Problem Relation Age of Onset  . Anxiety disorder Mother   . Hyperlipidemia Mother   . Depression Mother   . Hypertension Mother   . Diabetes Mother   . Thyroid disease Mother   . Hypertension Sister   . Diabetes Sister   . ALS Father   . Crohn's disease Brother   . Crohn's disease Sister      ROS:  Please see  the history of present illness.   All other ROS reviewed and negative.     Physical Exam/Data:   Vitals:   05/24/20 0530 05/24/20 0600 05/24/20 0700 05/24/20 0945  BP: (!) 191/102 (!) 109/94 (!) 141/72 (!) 160/90  Pulse: 69 84 77 74  Resp: 14 (!) _0 Temp:   98.2 F (36.8 C)   TempSrc:   Oral   SpO2: 100% 97% 99% 100%  Weight:      Height:       No intake or output data in the 24 hours ending 05/24/20 1000 Last 3 Weights 05/24/2020 11/29/2019 11/03/2019  Weight (lbs) 200 lb 192 lb 190 lb  Weight (kg) 90.719 kg 87.091 kg 86.183 kg     Body mass index is 32.28 kg/m.  General:  Obese female in NAD HEENT: normal Neck: no JVD Vascular: No carotid bruits  Cardiac:  normal S1, S2; RRR; no murmur  Lungs:  clear to auscultation bilaterally, no wheezing, rhonchi or rales  Abd: soft, nontender, no hepatomegaly  Ext: no edema Musculoskeletal:  No deformities, BUE and BLE strength normal and equal Skin: warm and dry  Neuro:  CNs 2-12 intact, no focal abnormalities noted Psych:  Normal affect   EKG:  The EKG was personally reviewed and demonstrates:  Sinus rhythm with HR 82, LVH Telemetry:  Telemetry was personally reviewed and demonstrates:  Sinus rhythm HR 70-80s  Relevant CV Studies:  Echo pending final read   Laboratory Data:  High Sensitivity Troponin:   Recent Labs  Lab 05/23/20 2356 05/24/20 0426  TROPONINIHS 34* 32*     Chemistry Recent Labs  Lab 05/23/20 2356  NA 134*  K 3.8  CL 99  CO2 25  GLUCOSE 262*  BUN 23*  CREATININE 1.78*  CALCIUM 9.4  GFRNONAA 33*  GFRAA 38*  ANIONGAP 10    No results for input(s): PROT, ALBUMIN, AST, ALT, ALKPHOS, BILITOT in the  last 168 hours. Hematology Recent Labs  Lab 05/23/20 2356  WBC 12.4*  RBC 3.97  HGB 10.6*  HCT 32.1*  MCV 80.9  MCH 26.7  MCHC 33.0  RDW 14.0  PLT 366   BNP Recent Labs  Lab 05/24/20 0000  BNP 166.1*    DDimer No results for input(s): DDIMER in the last 168  hours.   Radiology/Studies:  DG Chest 2 View  Result Date: 05/23/2020 CLINICAL DATA:  Chest pain and shortness of breath EXAM: CHEST - 2 VIEW COMPARISON:  06/24/2015 FINDINGS: The heart size and mediastinal contours are within normal limits. Both lungs are clear. The visualized skeletal structures are unremarkable. IMPRESSION: No active cardiopulmonary disease. Electronically Signed   By: Inez Catalina M.D.   On: 05/23/2020 23:37       TIMI Risk Score for Unstable Angina or Non-ST Elevation MI:   The patient's TIMI risk score is 3, which indicates a 13% risk of all cause mortality, new or recurrent myocardial infarction or need for urgent revascularization in the next 14 days.      Assessment and Plan:   Chest pain Elevated troponin - hs troponin 34 --> 32 Suspect this is demand ischemia in the setting of hypertensive urgency. However, she does describe symptoms concerning for stable angina progressing to [place] [facility] include HTN, HLD, obesity, DM, and suspected OSA - recommend first controlled blood pressure - once BP better controlled, she would benefit from an ischemic evaluation - since CE mild and flat and no ischemic changes on EKG combined with AKI, would favor nuclear stress test this weekend   Hypertensive urgency Hypertension  Medication noncompliance - she has required IV medication for the above - 10 mg amlodipine, 300 mg irbesartan - home regimen amlodipine-olmesartan, clonidine 0.1 mg PRN 1-2 times daily, toprol 100 mg, triamterene-HCTZ - would avoid clonidine in this patient given her history of noncompliance - will change toprol to carvedilol for better pressure control - medication selection may change once echocardiogram is read - agree with amlodipine and ARB for now - will need to follow renal function - will add hydralazine 50 mg TID today if she is still uncontrolled on amlodipine and ARB   Hyperlipidemia 05/24/2020: Cholesterol 222; HDL 35; LDL Cholesterol  121; Triglycerides 329; VLDL 66 - given risk factors, favor increasing home crestor to 40 mg   DM - uncontrolled with A1c 10.8% - per primary - she has not been able to afford ozempic - discussed that it may be time for insulin   Acute on CKD stage III - sCr 1.78,baseline appears to be near 1.2-1.4, although was 1.98 10/2019   Snoring Obesity - recommend outpatient sleep study and weight loss  For questions or updates, please contact [facility] Please consult [email or web address] for contact info under    Signed, Ledora Bottcher, PA  05/24/2020 10:00 AM  As above, patient seen and examined.  Briefly she is a 49 year old female with past medical history of diabetes mellitus, hypertension, hyperlipidemia, chronic stage III kidney disease for evaluation of unstable angina.  Patient states she had Covid approximately 1-1/2 years ago.  She has had some dyspnea on exertion since then.  However over the past 2 months she has noticed worsening dyspnea on exertion occurring now with walking across the room.  She also has a burning pain in her chest with these activities.  Her symptoms are relieved with rest.  Her chest pain does not radiate and there is no associated nausea, vomiting or  diaphoresis.  She recently ran out of her medications.  She was admitted and cardiology asked to evaluate.  Initial blood pressure 201/111.  Chest x-ray with no infiltrate.  Creatinine 1.60.  Troponin 34 and 32.  Hemoglobin 10.5.  Hemoglobin A1c 10.8.  Echocardiogram showed normal LV function, moderate left ventricular hypertrophy, grade 2 diastolic dysfunction, cannot rule out fibroblastoma on aortic valve.  Electrocardiogram shows sinus rhythm with nonspecific ST changes; left ventricular hypertrophy.  1 unstable angina-patient symptoms are concerning particular in light of multiple cardiac risk factors.  We will treat with aspirin, beta-blocker and statin.  She will need cardiac catheterization.  The risks and  benefits including myocardial infarction, CVA, death and worsening renal function discussed and she agrees to proceed.  Given renal insufficiency will hold diuretic.  Limit diet.  Hydrate prior to procedure.  Follow renal function closely afterwards.  Plan catheterization on Monday.  2 question fibroblastoma on aortic valve-I have personally reviewed the patient's echocardiogram.  We will plan transesophageal echocardiogram early next week to better assess.  3 hypertension-patient was extremely hypertensive at time of the admission.  Her blood pressure is improved.  Continue present medications and adjust regimen as needed.  4 hyperlipidemia-increase Crestor to 40 mg daily.  Check lipids and liver in 12 weeks.  5 chronic stage IIIa kidney disease-follow renal function closely after procedure with plan as outlined under unstable angina.  Kirk Ruths, MD

## 2020-05-24 NOTE — Progress Notes (Signed)
°  Echocardiogram 2D Echocardiogram has been performed.  Casey Acosta 05/24/2020, 8:57 AM

## 2020-05-24 NOTE — ED Notes (Signed)
Pt's systolic BP consistently remains above 180 despite NTP and Vasotec. Hydralazine administered per PRN order. Pt asleep. Arousable to voice. Denies chest pain or SHOB at this time. Dr. Cyd Silence made aware.

## 2020-05-24 NOTE — ED Notes (Signed)
Some 10am meds given that are available in ED Pyxis. Awaiting remaining meds directly from pharmacy. Message sent. Cont to monitor.

## 2020-05-24 NOTE — ED Notes (Signed)
Orders placed by provider. Awaiting meds from pharmacy to medicate pt. Message sent to pharmacy.

## 2020-05-24 NOTE — Telephone Encounter (Signed)
Pt is currently admitted in the [facility]

## 2020-05-24 NOTE — H&P (Signed)
History and Physical    Casey Acosta UTM:[id number] DOB: 1971/02/25 DOA: 05/23/2020  PCP: Billie Ruddy, MD  Patient coming from: Home   Chief Complaint:  Chief Complaint  Patient presents with  . Chest Pain  . Shortness of Breath     HPI:   49 year old female with past medical history of hypertension, hyperlipidemia, diabetes mellitus type 2 who presents to [facility] emergency department with complaints of chest discomfort.  Patient explains that for the past 2 weeks she has been experiencing episodes of chest discomfort.  Patient states that these episodes of chest discomfort are moderate to severe in intensity, midsternal in location and burning in quality.  Patient states that these episodes typically occur with exertion can at times occur at rest, especially when lying down.  Patient states that in addition to this chest discomfort she is experiencing, she is also experiencing shortness of breath with exertion.  This has also been progressively worsening over time and is now moderate/severe in intensity.  Patient also complains of some associated dry nonproductive cough, particularly when she gets episodes of chest discomfort while lying down.  Patient denies any associated leg swelling, fevers, palpitations.  Of note, patient also states that she ran out of all of her blood pressure medication several days ago.  Patient eventually presented to [facility] emergency department for evaluation.  Upon evaluation in the emergency department patient is been found to have markedly elevated blood pressures as high as 211/119.  Patient is also been found to have a slightly elevated troponin of 34.  Due to patient's numerous risk factors, presentation with uncontrolled hypertension and description of intermittent chest discomfort the hospitalist group has been called to assess the patient for admission to the hospital.    Review of Systems:   Review of Systems    Respiratory: Positive for shortness of breath.   Cardiovascular: Positive for chest pain.  All other systems reviewed and are negative.   Past Medical History:  Diagnosis Date  . Anemia   . Anxiety   . Anxiety and depression   . Chronic kidney disease   . Colon polyp   . Diabetes mellitus without complication ([place])   . Dysuria   . High risk sexual behavior   . History of syncope   . Hyperlipidemia   . Hypertension   . Internal hemorrhoids   . LVH (left ventricular hypertrophy)   . Malaise and fatigue   . Obesity (BMI 30-39.9)   . Ovarian mass   . Panic disorder   . PCOS (polycystic ovarian syndrome)   . Rectal bleeding   . Shortness of breath   . Surgical menopause   . Vaginal trichomoniasis     Past Surgical History:  Procedure Laterality Date  . ABDOMINAL HYSTERECTOMY     partial  . APPENDECTOMY    . CHOLECYSTECTOMY    . TUBAL LIGATION       reports that she has never smoked. She has never used smokeless tobacco. She reports current alcohol use. She reports that she does not use drugs.  Allergies  Allergen Reactions  . Dilaudid [Hydromorphone] Nausea And Vomiting  . Prozac [Fluoxetine Hcl]     hallucinations    Family History  Problem Relation Age of Onset  . Anxiety disorder Mother   . Hyperlipidemia Mother   . Depression Mother   . Hypertension Mother   . Diabetes Mother   . Thyroid disease Mother   . Hypertension Sister   .  Diabetes Sister   . ALS Father   . Crohn's disease Brother   . Crohn's disease Sister      Prior to Admission medications   Medication Sig Start Date End Date Taking? Authorizing Provider  acetaminophen (TYLENOL) 500 MG tablet Take 1,000 mg by mouth every 6 (six) hours as needed for mild pain, moderate pain, fever or headache.   Yes [provider]  amLODipine-olmesartan (AZOR) 10-40 MG tablet TAKE ONE TABLET BY MOUTH DAILY Patient taking differently: Take 1 tablet by mouth daily.  02/15/20  Yes Billie Ruddy,  MD  aspirin EC 81 MG tablet Take 81 mg by mouth daily.   Yes [provider]  cloNIDine (CATAPRES) 0.1 MG tablet Take 1 tablet (0.1 mg total) by mouth daily. Patient taking differently: Take 0.1 mg by mouth at bedtime as needed. High Blood pressure Diastolic > 90 09/05/08  Yes Billie Ruddy, MD  hydrOXYzine (ATARAX/VISTARIL) 50 MG tablet Take 1 tablet (50 mg total) by mouth 3 (three) times daily as needed. Patient taking differently: Take 50 mg by mouth 3 (three) times daily as needed for anxiety (sleep).  10/26/19  Yes Billie Ruddy, MD  metFORMIN (GLUCOPHAGE) 500 MG tablet Take 1 tablet (500 mg total) by mouth daily with breakfast. 03/09/19  Yes Biagio Borg, MD  metoprolol succinate (TOPROL-XL) 100 MG 24 hr tablet TAKE ONE TABLET BY MOUTH DAILY WITH OR IMMEDIATELY FOLLOWING A MEAL Patient taking differently: Take 100 mg by mouth daily.  01/15/20  Yes Billie Ruddy, MD  rosuvastatin (CRESTOR) 10 MG tablet Take 1 tablet (10 mg total) by mouth daily. 10/30/19  Yes Billie Ruddy, MD  Semaglutide,0.25 or 0.5MG /DOS, (OZEMPIC, 0.25 OR 0.5 MG/DOSE,) 2 MG/1.5ML SOPN Inject 0.5 mg into the skin once a week. 10/02/19  Yes Elayne Snare, MD  triamterene-hydrochlorothiazide (MAXZIDE-25) 37.5-25 MG tablet TAKE ONE TABLET BY MOUTH DAILY Patient taking differently: Take 1 tablet by mouth daily.  02/15/20  Yes Billie Ruddy, MD  blood glucose meter kit and supplies KIT Dispense based on patient and insurance preference. Use up to four times daily as directed. 09/20/18   Lance Sell, NP  tinidazole (TINDAMAX) 500 MG tablet Take 4 tablets today and 4 tablets tomorrow, no alcohol Patient not taking: Reported on 05/24/2020 11/29/19   Huel Cote, NP    Physical Exam: Vitals:   05/24/20 0001 05/24/20 0130 05/24/20 0200 05/24/20 0215  BP: (!) 189/109 (!) 167/137 (!) 211/119 (!) 207/113  Pulse: 79 80 78 78  Resp: 16 18 17 15   Temp:      TempSrc:      SpO2: 100% 100% 100% 100%     Constitutional: Acute alert and oriented x3, no associated distress.   Skin: no rashes, no lesions, good skin turgor noted. Eyes: Pupils are equally reactive to light.  No evidence of scleral icterus or conjunctival pallor.  ENMT: Moist mucous membranes noted.  Posterior pharynx clear of any exudate or lesions.   Neck: normal, supple, no masses, no thyromegaly.  No evidence of jugular venous distension.   Respiratory: clear to auscultation bilaterally, no wheezing, no crackles. Normal respiratory effort. No accessory muscle use.  Cardiovascular: Regular rate and rhythm, no murmurs / rubs / gallops. No extremity edema. 2+ pedal pulses. No carotid bruits.  Chest:   Nontender without crepitus or deformity.   Back:   Nontender without crepitus or deformity. Abdomen: Abdomen is soft and nontender.  No evidence of intra-abdominal masses.  Positive bowel sounds noted in all quadrants.   Musculoskeletal: No joint deformity upper and lower extremities. Good ROM, no contractures. Normal muscle tone.  Neurologic: CN 2-12 grossly intact. Sensation intact.  Patient moving all 4 extremities spontaneously.  Patient is following all commands.  Patient is responsive to verbal stimuli.   Psychiatric: Patient exhibits normal mood with appropriate affect.  Patient seems to possess insight as to their current situation.     Labs on Admission: I have personally reviewed following labs and imaging studies -   CBC: Recent Labs  Lab 05/23/20 2356  WBC 12.4*  HGB 10.6*  HCT 32.1*  MCV 80.9  PLT 308   Basic Metabolic Panel: Recent Labs  Lab 05/23/20 2356  NA 134*  K 3.8  CL 99  CO2 25  GLUCOSE 262*  BUN 23*  CREATININE 1.78*  CALCIUM 9.4   GFR: CrCl cannot be calculated (Unknown ideal weight.). Liver Function Tests: No results for input(s): AST, ALT, ALKPHOS, BILITOT, PROT, ALBUMIN in the last 168 hours. No results for input(s): LIPASE, AMYLASE in the last 168 hours. No results for input(s):  AMMONIA in the last 168 hours. Coagulation Profile: No results for input(s): INR, PROTIME in the last 168 hours. Cardiac Enzymes: No results for input(s): CKTOTAL, CKMB, CKMBINDEX, TROPONINI in the last 168 hours. BNP (last 3 results) No results for input(s): PROBNP in the last 8760 hours. HbA1C: No results for input(s): HGBA1C in the last 72 hours. CBG: No results for input(s): GLUCAP in the last 168 hours. Lipid Profile: No results for input(s): CHOL, HDL, LDLCALC, TRIG, CHOLHDL, LDLDIRECT in the last 72 hours. Thyroid Function Tests: No results for input(s): TSH, T4TOTAL, FREET4, T3FREE, THYROIDAB in the last 72 hours. Anemia Panel: No results for input(s): VITAMINB12, FOLATE, FERRITIN, TIBC, IRON, RETICCTPCT in the last 72 hours. Urine analysis:    Component Value Date/Time   COLORURINE YELLOW 02/23/2018 1601   APPEARANCEUR Sl Cloudy (A) 02/23/2018 1601   LABSPEC >=1.030 (A) 02/23/2018 1601   PHURINE 5.5 02/23/2018 1601   GLUCOSEU 100 (A) 02/23/2018 1601   HGBUR NEGATIVE 02/23/2018 1601   BILIRUBINUR NEGATIVE 02/23/2018 1601   KETONESUR NEGATIVE 02/23/2018 1601   PROTEINUR 100 (A) 10/16/2017 1043   UROBILINOGEN 0.2 02/23/2018 1601   NITRITE NEGATIVE 02/23/2018 1601   LEUKOCYTESUR NEGATIVE 02/23/2018 1601    Radiological Exams on Admission - Personally Reviewed: DG Chest 2 View  Result Date: 05/23/2020 CLINICAL DATA:  Chest pain and shortness of breath EXAM: CHEST - 2 VIEW COMPARISON:  06/24/2015 FINDINGS: The heart size and mediastinal contours are within normal limits. Both lungs are clear. The visualized skeletal structures are unremarkable. IMPRESSION: No active cardiopulmonary disease. Electronically Signed   By: Inez Catalina M.D.   On: 05/23/2020 23:37    EKG: Personally reviewed.  Rhythm is normal sinus rhythm with heart rate of 82 bpm.  No dynamic ST segment changes appreciated.  Assessment/Plan Principal Problem:   Chest pain   Patient presenting with chest  discomfort with both typical and atypical features.  Patient is a longstanding history of diabetes, hypertension and intermittent noncompliance with her medical therapies places her at extremely high risk of coronary disease.  Placing patient on telemetry  Cycling cardiac enzymes  Obtaining echocardiogram in the morning  We will contact cardiology for consultation in the morning for consideration of noninvasive ischemic assessment while hospitalized.  In the meantime, we will attempt to achieve improved blood pressures by restarting home regimen of oral antihypertensives with as  needed intravenous antihypertensives.  Active Problems:   Elevated troponin   Elevated troponin likely secondary to markedly elevated blood pressures and not secondary to plaque rupture  Patient is currently chest pain-free at rest  Please see remainder of assessment and plan above.   Hypertensive emergency   Patient presenting with markedly elevated blood pressures and slightly elevated troponin in the setting of intermittent medication compliance.  Restarting home regimen of oral antihypertensive therapy  Providing patient with additional intravenous antihypertensives for excessively elevated blood pressures.  Longstanding history of medication noncompliance place patient at high risk of coronary disease    Uncontrolled type 2 diabetes mellitus with diabetic nephropathy, without long-term current use of insulin ([place])  . Patient been placed on Accu-Cheks before every meal and nightly with sliding scale insulin . Holding home regimen of Metformin and Ozempic. Marland Kitchen Hemoglobin A1C ordered . Diabetic Diet    Chronic kidney disease, stage 3b  . Strict intake and output monitoring . Creatinine near baseline . Minimizing nephrotoxic agents as much as possible . Serial chemistries to monitor renal function and electrolytes    Mixed diabetic hyperlipidemia associated with type 2 diabetes mellitus  ([place])   Obtaining lipid panel.  Continue home regimen of statin therapy     Code Status:  Full code Family Communication: deferred   Status is: Observation  The patient remains OBS appropriate and will d/c before 2 midnights.  Dispo: The patient is from: Home              Anticipated d/c is to: Home              Anticipated d/c date is: 2 days              Patient currently is not medically stable to d/c.        Vernelle Emerald MD [facility] Pager [phone]  If 7PM-7AM, please contact night-coverage [email or web address] Use universal [place] password for that web site. If you do not have the password, please call the hospital operator.  05/24/2020, 3:32 AM

## 2020-05-24 NOTE — Progress Notes (Signed)
    Pt scheduled for TEE to further evaluate aortic valve. Plan for Monday 05/27/20 at 0900 with MAC.   Kathyrn Drown NP-C [place] Pager: [phone]

## 2020-05-24 NOTE — Progress Notes (Addendum)
Called to bedside for 6/10 chest pain. She no longer has nitro paste on due to headache. Awaiting repeat 12-lead EKG. Third troponin remains flat.   She states this is her normal pain and has not changed in quality or intensity.   Will start nitro drip.   For now, plan for TEE followed by heart cath on Monday. Orders placed. NPO midnight Sunday night.    Ledora Bottcher, PA-C 05/24/2020, 11:54 AM [place] [address] [place], [place] [address]

## 2020-05-24 NOTE — ED Notes (Signed)
Med received from pharmacy. Medicated pt a/o. Pt asleep. Arousable to voice. Denies CP or SHOB at this time. Skin w/d/pink. Resp wnl, equal and non-labored. NAD. Cont to monitor.

## 2020-05-24 NOTE — ED Notes (Signed)
Pt's systolic BP consistently remains above 180 despite NTP and Vasotec. Hydralazine administered per PRN

## 2020-05-24 NOTE — Progress Notes (Signed)
49 year old lady with prior h/o hypertension, hyperlipidemia, DM type 2, admitted for chest discomfort and hypertensive urgency.  Her troponin s are mildly elevated. EKG shows left ventricular hypertrophy.  She continues to have intermittent chest pain, cardiology consulted for recommendations.  Pt seen and examined at bedside.    Plan:  1. Continue with aspirin, BB and statin.  2. NTG gtt  3. Further intervention with cardiac cath and TEE on Monday.  4. bp control.   Hosie Poisson, MD

## 2020-05-25 DIAGNOSIS — N1832 Chronic kidney disease, stage 3b: Secondary | ICD-10-CM

## 2020-05-25 DIAGNOSIS — R778 Other specified abnormalities of plasma proteins: Secondary | ICD-10-CM

## 2020-05-25 DIAGNOSIS — R079 Chest pain, unspecified: Secondary | ICD-10-CM

## 2020-05-25 DIAGNOSIS — I358 Other nonrheumatic aortic valve disorders: Secondary | ICD-10-CM

## 2020-05-25 DIAGNOSIS — I161 Hypertensive emergency: Principal | ICD-10-CM

## 2020-05-25 LAB — GLUCOSE, CAPILLARY
Glucose-Capillary: 199 mg/dL — ABNORMAL HIGH (ref 70–99)
Glucose-Capillary: 256 mg/dL — ABNORMAL HIGH (ref 70–99)
Glucose-Capillary: 186 mg/dL — ABNORMAL HIGH (ref 70–99)
Glucose-Capillary: 304 mg/dL — ABNORMAL HIGH (ref 70–99)

## 2020-05-25 MED ORDER — CARVEDILOL 25 MG PO TABS
25.0000 mg | ORAL_TABLET | Freq: Two times a day (BID) | ORAL | Status: DC
  Administered 2020-05-25 – 2020-05-28 (×7): 25 mg via ORAL
  Filled 2020-05-25 (×7): qty 1

## 2020-05-25 MED ORDER — HYDRALAZINE HCL 50 MG PO TABS
50.0000 mg | ORAL_TABLET | Freq: Two times a day (BID) | ORAL | Status: DC
  Administered 2020-05-25: 50 mg via ORAL
  Filled 2020-05-25: qty 1

## 2020-05-25 MED ORDER — HYDRALAZINE HCL 50 MG PO TABS
100.0000 mg | ORAL_TABLET | Freq: Three times a day (TID) | ORAL | Status: DC
  Administered 2020-05-25 – 2020-05-27 (×4): 100 mg via ORAL
  Filled 2020-05-25 (×4): qty 2

## 2020-05-25 NOTE — Progress Notes (Signed)
Patient feels "like I'm short of breath". Has no chest pain. O2 saturation is 100% on room air. Resp count is 20 with no obvious signs of distress. Also, expressed to nurse earlier that her "breathing has not been the same since I had Covid". Continue to monitor. BP remains elevated, but prn given earlier.Eulas Post, RN

## 2020-05-25 NOTE — Progress Notes (Signed)
Progress Note  Patient Name: Casey Acosta Date of Encounter: 05/25/2020  [facility] Cardiologist: Kirk Ruths, MD   Subjective   Feeling better.  Less chest pain and headache better.   Inpatient Medications    Scheduled Meds:  amLODipine  10 mg Oral Daily   aspirin  325 mg Oral Daily   enoxaparin (LOVENOX) injection  40 mg Subcutaneous Q24H   insulin aspart  0-15 Units Subcutaneous TID AC & HS   irbesartan  300 mg Oral Daily   metoprolol succinate  100 mg Oral Daily   rosuvastatin  40 mg Oral Daily   Continuous Infusions:  sodium chloride 250 mL (05/24/20 1337)   nitroGLYCERIN 10 mcg/min (05/24/20 1703)   PRN Meds: sodium chloride, acetaminophen, hydrALAZINE, hydrOXYzine, ondansetron (ZOFRAN) IV, polyethylene glycol   Vital Signs    Vitals:   05/24/20 2142 05/25/20 0157 05/25/20 0620 05/25/20 0906  BP: (!) 177/96 (!) 186/104 (!) 181/96 (!) 193/98  Pulse: 79 68 79 81  Resp: 14 14 17    Temp: 97.8 F (36.6 C) 98.4 F (36.9 C) 98.7 F (37.1 C)   TempSrc: Oral Oral Oral   SpO2: 97% 100% 100%   Weight:      Height:        Intake/Output Summary (Last 24 hours) at 05/25/2020 0925 Last data filed at 05/25/2020 0500 Gross per 24 hour  Intake 287.79 ml  Output --  Net 287.79 ml   Last 3 Weights 05/24/2020 11/29/2019 11/03/2019  Weight (lbs) 200 lb 192 lb 190 lb  Weight (kg) 90.719 kg 87.091 kg 86.183 kg      Telemetry    Sinus rhythm - Personally Reviewed  ECG    05/24/20: Sinus rhythm.  Rate 70 bpm.  LVH with repolarization abnormalities.  - Personally Reviewed  Physical Exam   VS:  BP (!) 193/98    Pulse 81    Temp 98.7 F (37.1 C) (Oral)    Resp 17    Ht 5\' 6"  (1.676 m)    Wt 90.7 kg    SpO2 100%    BMI 32.28 kg/m  , BMI Body mass index is 32.28 kg/m. GENERAL:  Well appearing HEENT: Pupils equal round and reactive, fundi not visualized, oral mucosa unremarkable NECK:  No jugular venous distention, waveform within normal limits, carotid  upstroke brisk and symmetric, no bruits LUNGS:  Clear to auscultation bilaterally HEART:  RRR.  PMI not displaced or sustained,S1 and S2 within normal limits, no S3, no S4, no clicks, no rubs, no murmurs ABD:  Flat, positive bowel sounds normal in frequency in pitch, no bruits, no rebound, no guarding, no midline pulsatile mass, no hepatomegaly, no splenomegaly EXT:  2 plus pulses throughout, no edema, no cyanosis no clubbing SKIN:  No rashes no nodules NEURO:  Cranial nerves II through XII grossly intact, motor grossly intact throughout PSYCH:  Cognitively intact, oriented to person place and time  Labs    High Sensitivity Troponin:   Recent Labs  Lab 05/23/20 2356 05/24/20 0426 05/24/20 0948  TROPONINIHS 34* 32* 32*      Chemistry Recent Labs  Lab 05/23/20 2356 05/24/20 0948  NA 134* 135  K 3.8 3.5  CL 99 100  CO2 25 25  GLUCOSE 262* 197*  BUN 23* 19  CREATININE 1.78* 1.60*  CALCIUM 9.4 9.0  GFRNONAA 33* 37*  GFRAA 38* 43*  ANIONGAP 10 10     Hematology Recent Labs  Lab 05/23/20 2356 05/24/20 0948  WBC 12.4*  10.4  RBC 3.97 3.87  HGB 10.6* 10.5*  HCT 32.1* 31.3*  MCV 80.9 80.9  MCH 26.7 27.1  MCHC 33.0 33.5  RDW 14.0 14.2  PLT 366 305    BNP Recent Labs  Lab 05/24/20 0000  BNP 166.1*     DDimer No results for input(s): DDIMER in the last 168 hours.   Radiology    DG Chest 2 View  Result Date: 05/23/2020 CLINICAL DATA:  Chest pain and shortness of breath EXAM: CHEST - 2 VIEW COMPARISON:  06/24/2015 FINDINGS: The heart size and mediastinal contours are within normal limits. Both lungs are clear. The visualized skeletal structures are unremarkable. IMPRESSION: No active cardiopulmonary disease. Electronically Signed   By: Inez Catalina M.D.   On: 05/23/2020 23:37   ECHOCARDIOGRAM COMPLETE  Result Date: 05/24/2020    ECHOCARDIOGRAM REPORT   Patient Name:   Casey Acosta Date of Exam: 05/24/2020 Medical Rec #:  [id number]        Height:       66.0 in  Accession #:    [id number]       Weight:       200.0 lb Date of Birth:  [birth year]        BSA:          2.000 m Patient Age:    49 years         BP:           141/72 mmHg Patient Gender: F                HR:           77 bpm. Exam Location:  Inpatient Procedure: 2D Echo Indications:    cardiomyopathy 425.9  History:        Patient has no prior history of Echocardiogram examinations.                 Risk Factors:Diabetes, Hypertension and Dyslipidemia. LVH.  Sonographer:    Jannett Celestine RDCS (AE) Referring Phys: [id number] [place]  1. There is a small mass (0.6 cm x 0.6 cm) attached to the [place] or LCC of the aortic valve (difficult to determine). This likely represents calcified mass vs papillary fibroelastoma. There is no regurgitation or significant destruction of the valve to suggest this is vegetation. Clinical correlation is needed and would recommend TEE for better characterization. The aortic valve is tricuspid. Aortic valve regurgitation is not visualized. No aortic stenosis is present.  2. Left ventricular ejection fraction, by estimation, is 60 to 65%. The left ventricle has normal function. The left ventricle has no regional wall motion abnormalities. There is moderate concentric left ventricular hypertrophy. Left ventricular diastolic parameters are consistent with Grade II diastolic dysfunction (pseudonormalization). Elevated left atrial pressure.  3. Right ventricular systolic function is normal. The right ventricular size is normal. Tricuspid regurgitation signal is inadequate for assessing PA pressure.  4. The mitral valve is grossly normal. Trivial mitral valve regurgitation. No evidence of mitral stenosis.  5. The inferior vena cava is normal in size with greater than 50% respiratory variability, suggesting right atrial pressure of 3 mmHg. FINDINGS  Left Ventricle: Left ventricular ejection fraction, by estimation, is 60 to 65%. The left ventricle has normal function. The left  ventricle has no regional wall motion abnormalities. The left ventricular internal cavity size was normal in size. There is  moderate concentric left ventricular hypertrophy. Left ventricular diastolic parameters are consistent with Grade II diastolic dysfunction (pseudonormalization). Elevated  left atrial pressure. Right Ventricle: The right ventricular size is normal. No increase in right ventricular wall thickness. Right ventricular systolic function is normal. Tricuspid regurgitation signal is inadequate for assessing PA pressure. Left Atrium: Left atrial size was normal in size. Right Atrium: Right atrial size was normal in size. Pericardium: Trivial pericardial effusion is present. Mitral Valve: The mitral valve is grossly normal. Trivial mitral valve regurgitation. No evidence of mitral valve stenosis. Tricuspid Valve: The tricuspid valve is grossly normal. Tricuspid valve regurgitation is not demonstrated. No evidence of tricuspid stenosis. Aortic Valve: There is a small mass (0.6 cm x 0.6 cm) attached to the [place] or LCC of the aortic valve (difficult to determine). This likely represents calcified mass vs papillary fibroelastoma. There is no regurgitation or significant destruction of the valve to suggest this is vegetation. Clinical correlation is needed and would recommend TEE for better characterization. The aortic valve is tricuspid. Aortic valve regurgitation is not visualized. No aortic stenosis is present. Pulmonic Valve: The pulmonic valve was grossly normal. Pulmonic valve regurgitation is not visualized. No evidence of pulmonic stenosis. Aorta: The aortic root and ascending aorta are structurally normal, with no evidence of dilitation. Venous: The inferior vena cava is normal in size with greater than 50% respiratory variability, suggesting right atrial pressure of 3 mmHg. IAS/Shunts: The atrial septum is grossly normal.  LEFT VENTRICLE PLAX 2D LVIDd:         4.40 cm  Diastology LVIDs:         2.70  cm  LV e' medial:    4.13 cm/s LV PW:         1.50 cm  LV E/e' medial:  24.5 LV IVS:        1.50 cm  LV e' lateral:   5.00 cm/s LVOT diam:     2.00 cm  LV E/e' lateral: 20.2 LV SV:         52 LV SV Index:   26 LVOT Area:     3.14 cm  LEFT ATRIUM             Index       RIGHT ATRIUM           Index LA diam:        3.90 cm 1.95 cm/m  RA Area:     13.70 cm LA Vol (A2C):   39.4 ml 19.70 ml/m RA Volume:   30.90 ml  15.45 ml/m LA Vol (A4C):   31.5 ml 15.75 ml/m LA Biplane Vol: 36.7 ml 18.35 ml/m  AORTIC VALVE LVOT Vmax:   90.80 cm/s LVOT Vmean:  60.800 cm/s LVOT VTI:    0.164 m  AORTA Ao Root diam: 3.30 cm MITRAL VALVE MV Area (PHT): 3.21 cm     SHUNTS MV Decel Time: 236 msec     Systemic VTI:  0.16 m MV E velocity: 101.00 cm/s  Systemic Diam: 2.00 cm MV A velocity: 83.70 cm/s MV E/A ratio:  1.21 Eleonore Chiquito MD Electronically signed by Eleonore Chiquito MD Signature Date/Time: 05/24/2020/10:22:02 AM    Final     Cardiac Studies   Echo 05/2020: 1. There is a small mass (0.6 cm x 0.6 cm) attached to the [place] or LCC of  the aortic valve (difficult to determine). This likely represents  calcified mass vs papillary fibroelastoma. There is no regurgitation or  significant destruction of the valve to  suggest this is vegetation. Clinical correlation is needed and would  recommend TEE for better  characterization. The aortic valve is tricuspid.  Aortic valve regurgitation is not visualized. No aortic stenosis is  present.  2. Left ventricular ejection fraction, by estimation, is 60 to 65%. The  left ventricle has normal function. The left ventricle has no regional  wall motion abnormalities. There is moderate concentric left ventricular  hypertrophy. Left ventricular  diastolic parameters are consistent with Grade II diastolic dysfunction  (pseudonormalization). Elevated left atrial pressure.  3. Right ventricular systolic function is normal. The right ventricular  size is normal. Tricuspid regurgitation  signal is inadequate for assessing  PA pressure.  4. The mitral valve is grossly normal. Trivial mitral valve  regurgitation. No evidence of mitral stenosis.  5. The inferior vena cava is normal in size with greater than 50%  respiratory variability, suggesting right atrial pressure of 3 mmHg.   Patient Profile     49 y.o. female with hypertension, hyperlipidemia, CKD 3, none adherence, and diabetes admitted with hypertensive urgency and chest pain.  Assessment & Plan    # Chest pain:  # Elevated troponin:  Troponin minimally elevated to 34 and flat in the setting of hypertensive urgency.  Less likely that this represents ACS.  Once her blood pressure is better controlled, will plan for a Lexiscan Myoview.  Consider doing this tomorrow.  Will make NPO after MN.  # Hypertensive urgency: # Medical non-adherence:  Blood pressures remain poorly controlled.  Will switch metoprolol to carvedilol today.  Plan to avoid clonidine given medical noncompliance in the past.  Moderate LVH on echo consistent with poorly controlled hypertension.  Renal artery Dopplers were - 12/2019.  Blood pressure remains poorly controlled on amlodipine and irbesartan.  She is typically also on Maxide which has been held here.  She notes this is sometimes hard to get at the pharmacy.  Holding for now in case she needs cath.  Consider switching to chlorthalidone.  Thyroid normal 10/2019.  # Aortic valve mass:  Noted on transthoracic echo.  Very small (0.6 cm x 0.6 cm).  Concern for calcified mass versus papillary fibroblastoma.  Plan for TEE TEE on Monday.      For questions or updates, please contact [place] Please consult [email or web address] for contact info under        Signed, Skeet Latch, MD  05/25/2020, 9:25 AM

## 2020-05-25 NOTE — Progress Notes (Signed)
PROGRESS NOTE    Casey Acosta  JIR:[id number] DOB: 07/12/1971 DOA: 05/23/2020 PCP: Billie Ruddy, MD    Chief Complaint  Patient presents with  . Chest Pain  . Shortness of Breath    Brief Narrative:   49 year old lady with prior h/o hypertension, hyperlipidemia, DM type 2, admitted for chest discomfort and hypertensive urgency.  On admission, her troponin s are mildly elevated. EKG shows left ventricular hypertrophy.  She continued to have intermittent chest pain, cardiology consulted for recommendations.  Patient's TIMI score is 3.  She is scheduled for cardiac catheterization and possible TEE on Monday  Assessment & Plan:   Principal Problem:   Chest pain Active Problems:   Uncontrolled type 2 diabetes mellitus with diabetic nephropathy, without long-term current use of insulin (HCC)   Chronic kidney disease, stage 3b   Elevated troponin   Hypertensive emergency   Mixed diabetic hyperlipidemia associated with type 2 diabetes mellitus ([place])   Unstable angina Multiple risk factors including hypertension uncontrolled diabetes, hyperlipidemia, with exertional chest pain, mildly elevated troponins. Plan for cardiac catheterization on Monday. Patient's chest pain has resolved now.  She is on room air with good oxygen saturations.  But she reports she continues to have some subjective feeling of shortness of breath and some heaviness in the chest intermittently. Her cardiogram reviewed.   Uncontrolled diabetes mellitus. CBG (last 3)  Recent Labs    05/24/20 2117 05/25/20 0723 05/25/20 1128  GLUCAP 252* 199* 256*   Resume SSI.  Hemo-globin A1c at 10.8 We will start the patient on 10 units of Lantus at bedtime.  Hold Metformin for now.   Hyperlipidemia Continue with Crestor 40 mg daily.  LDL at 121   Hypertensive urgency Blood pressure parameters now well controlled. Patient on Coreg, hydralazine 50 mg twice daily and Avapro 300 mg daily.   Stage IIIb  CKD Creatinine appears to be at baseline.    Anemia of chronic disease Baseline hemoglobin around 11 , currently hemoglobin around 10 and stable.   DVT prophylaxis: Lovenox Code Status: (FULL CODE.  Family Communication: (none at bedsdie.  Disposition:   Status is: Inpatient  Remains inpatient appropriate because:Ongoing diagnostic testing needed not appropriate for outpatient work up   Dispo: The patient is from: Home              Anticipated d/c is to: Home              Anticipated d/c date is: 2 days              Patient currently is not medically stable to d/c.       Consultants:   Cardiology.   Procedures: Echo.   Antimicrobials: none.    Subjective: Reports intermittent heaviness in the chest and subjective feeling of sob.   Objective: Vitals:   05/25/20 1013 05/25/20 1237 05/25/20 1343 05/25/20 1407  BP: (!) 183/106 (!) 183/98 (!) 189/92 (!) 180/79  Pulse: 80  89 88  Resp: 19  20 17   Temp: 98.6 F (37 C)   98.2 F (36.8 C)  TempSrc: Oral   Oral  SpO2: 98%  100% 100%  Weight:      Height:        Intake/Output Summary (Last 24 hours) at 05/25/2020 1543 Last data filed at 05/25/2020 0500 Gross per 24 hour  Intake 287.79 ml  Output --  Net 287.79 ml   Filed Weights   05/24/20 0413  Weight: 90.7 kg  Examination:  General exam: Appears calm and comfortable  Respiratory system: Clear to auscultation. Respiratory effort normal. Cardiovascular system: S1 & S2 heard, RRR. No JVD,  No pedal edema. Gastrointestinal system: Abdomen is nondistended, soft and nontender.. Normal bowel sounds heard. Central nervous system: Alert and oriented. No focal neurological deficits. Extremities: Symmetric 5 x 5 power. Skin: No rashes, lesions or ulcers Psychiatry: Mood & affect appropriate.     Data Reviewed: I have personally reviewed following labs and imaging studies  CBC: Recent Labs  Lab 05/23/20 2356 05/24/20 0948  WBC 12.4* 10.4  HGB 10.6*  10.5*  HCT 32.1* 31.3*  MCV 80.9 80.9  PLT 366 322    Basic Metabolic Panel: Recent Labs  Lab 05/23/20 2356 05/24/20 0948  NA 134* 135  K 3.8 3.5  CL 99 100  CO2 25 25  GLUCOSE 262* 197*  BUN 23* 19  CREATININE 1.78* 1.60*  CALCIUM 9.4 9.0    GFR: Estimated Creatinine Clearance: 47.7 mL/min (A) (by C-G formula based on SCr of 1.6 mg/dL (H)).  Liver Function Tests: No results for input(s): AST, ALT, ALKPHOS, BILITOT, PROT, ALBUMIN in the last 168 hours.  CBG: Recent Labs  Lab 05/24/20 1132 05/24/20 1658 05/24/20 2117 05/25/20 0723 05/25/20 1128  GLUCAP 183* 221* 252* 199* 256*     Recent Results (from the past 240 hour(s))  Respiratory Panel by RT PCR (Flu A&B, Covid) - Nasopharyngeal Swab     Status: None   Collection Time: 05/24/20  2:48 AM   Specimen: Nasopharyngeal Swab  Result Value Ref Range Status   SARS Coronavirus 2 by RT PCR NEGATIVE NEGATIVE Final    Comment: (NOTE) SARS-CoV-2 target nucleic acids are NOT DETECTED.  The SARS-CoV-2 RNA is generally detectable in upper respiratoy specimens during the acute phase of infection. The lowest concentration of SARS-CoV-2 viral copies this assay can detect is 131 copies/mL. A negative result does not preclude SARS-Cov-2 infection and should not be used as the sole basis for treatment or other patient management decisions. A negative result may occur with  improper specimen collection/handling, submission of specimen other than nasopharyngeal swab, presence of viral mutation(s) within the areas targeted by this assay, and inadequate number of viral copies (<131 copies/mL). A negative result must be combined with clinical observations, patient history, and epidemiological information. The expected result is Negative.  Fact Sheet for Patients:  PinkCheek.be  Fact Sheet for Healthcare Providers:  GravelBags.it  This test is no t yet approved or  cleared by the [place] FDA and  has been authorized for detection and/or diagnosis of SARS-CoV-2 by FDA under an Emergency Use Authorization (EUA). This EUA will remain  in effect (meaning this test can be used) for the duration of the COVID-19 declaration under Section 564(b)(1) of the Act, 21 U.S.C. section 360bbb-3(b)(1), unless the authorization is terminated or revoked sooner.     Influenza A by PCR NEGATIVE NEGATIVE Final   Influenza B by PCR NEGATIVE NEGATIVE Final    Comment: (NOTE) The Xpert Xpress SARS-CoV-2/FLU/RSV assay is intended as an aid in  the diagnosis of influenza from Nasopharyngeal swab specimens and  should not be used as a sole basis for treatment. Nasal washings and  aspirates are unacceptable for Xpert Xpress SARS-CoV-2/FLU/RSV  testing.  Fact Sheet for Patients: PinkCheek.be  Fact Sheet for Healthcare Providers: GravelBags.it  This test is not yet approved or cleared by the [place] FDA and  has been authorized for detection and/or diagnosis of SARS-CoV-2  by  FDA under an Emergency Use Authorization (EUA). This EUA will remain  in effect (meaning this test can be used) for the duration of the  Covid-19 declaration under Section 564(b)(1) of the Act, 21  U.S.C. section 360bbb-3(b)(1), unless the authorization is  terminated or revoked. Performed at [facility], [place] [address] [place], [place] [address]          Radiology Studies: DG Chest 2 View  Result Date: 05/23/2020 CLINICAL DATA:  Chest pain and shortness of breath EXAM: CHEST - 2 VIEW COMPARISON:  06/24/2015 FINDINGS: The heart size and mediastinal contours are within normal limits. Both lungs are clear. The visualized skeletal structures are unremarkable. IMPRESSION: No active cardiopulmonary disease. Electronically Signed   By: Inez Catalina M.D.   On: 05/23/2020 23:37   ECHOCARDIOGRAM  COMPLETE  Result Date: 05/24/2020    ECHOCARDIOGRAM REPORT   Patient Name:   Casey Acosta Date of Exam: 05/24/2020 Medical Rec #:  [id number]        Height:       66.0 in Accession #:    [id number]       Weight:       200.0 lb Date of Birth:  April 15, 1971        BSA:          2.000 m Patient Age:    15 years         BP:           141/72 mmHg Patient Gender: F                HR:           77 bpm. Exam Location:  Inpatient Procedure: 2D Echo Indications:    cardiomyopathy 425.9  History:        Patient has no prior history of Echocardiogram examinations.                 Risk Factors:Diabetes, Hypertension and Dyslipidemia. LVH.  Sonographer:    Jannett Celestine RDCS (AE) Referring Phys: [id number] [place]  1. There is a small mass (0.6 cm x 0.6 cm) attached to the [place] or LCC of the aortic valve (difficult to determine). This likely represents calcified mass vs papillary fibroelastoma. There is no regurgitation or significant destruction of the valve to suggest this is vegetation. Clinical correlation is needed and would recommend TEE for better characterization. The aortic valve is tricuspid. Aortic valve regurgitation is not visualized. No aortic stenosis is present.  2. Left ventricular ejection fraction, by estimation, is 60 to 65%. The left ventricle has normal function. The left ventricle has no regional wall motion abnormalities. There is moderate concentric left ventricular hypertrophy. Left ventricular diastolic parameters are consistent with Grade II diastolic dysfunction (pseudonormalization). Elevated left atrial pressure.  3. Right ventricular systolic function is normal. The right ventricular size is normal. Tricuspid regurgitation signal is inadequate for assessing PA pressure.  4. The mitral valve is grossly normal. Trivial mitral valve regurgitation. No evidence of mitral stenosis.  5. The inferior vena cava is normal in size with greater than 50% respiratory variability, suggesting  right atrial pressure of 3 mmHg. FINDINGS  Left Ventricle: Left ventricular ejection fraction, by estimation, is 60 to 65%. The left ventricle has normal function. The left ventricle has no regional wall motion abnormalities. The left ventricular internal cavity size was normal in size. There is  moderate concentric left ventricular hypertrophy. Left ventricular diastolic parameters are consistent with  Grade II diastolic dysfunction (pseudonormalization). Elevated left atrial pressure. Right Ventricle: The right ventricular size is normal. No increase in right ventricular wall thickness. Right ventricular systolic function is normal. Tricuspid regurgitation signal is inadequate for assessing PA pressure. Left Atrium: Left atrial size was normal in size. Right Atrium: Right atrial size was normal in size. Pericardium: Trivial pericardial effusion is present. Mitral Valve: The mitral valve is grossly normal. Trivial mitral valve regurgitation. No evidence of mitral valve stenosis. Tricuspid Valve: The tricuspid valve is grossly normal. Tricuspid valve regurgitation is not demonstrated. No evidence of tricuspid stenosis. Aortic Valve: There is a small mass (0.6 cm x 0.6 cm) attached to the [place] or LCC of the aortic valve (difficult to determine). This likely represents calcified mass vs papillary fibroelastoma. There is no regurgitation or significant destruction of the valve to suggest this is vegetation. Clinical correlation is needed and would recommend TEE for better characterization. The aortic valve is tricuspid. Aortic valve regurgitation is not visualized. No aortic stenosis is present. Pulmonic Valve: The pulmonic valve was grossly normal. Pulmonic valve regurgitation is not visualized. No evidence of pulmonic stenosis. Aorta: The aortic root and ascending aorta are structurally normal, with no evidence of dilitation. Venous: The inferior vena cava is normal in size with greater than 50% respiratory  variability, suggesting right atrial pressure of 3 mmHg. IAS/Shunts: The atrial septum is grossly normal.  LEFT VENTRICLE PLAX 2D LVIDd:         4.40 cm  Diastology LVIDs:         2.70 cm  LV e' medial:    4.13 cm/s LV PW:         1.50 cm  LV E/e' medial:  24.5 LV IVS:        1.50 cm  LV e' lateral:   5.00 cm/s LVOT diam:     2.00 cm  LV E/e' lateral: 20.2 LV SV:         52 LV SV Index:   26 LVOT Area:     3.14 cm  LEFT ATRIUM             Index       RIGHT ATRIUM           Index LA diam:        3.90 cm 1.95 cm/m  RA Area:     13.70 cm LA Vol (A2C):   39.4 ml 19.70 ml/m RA Volume:   30.90 ml  15.45 ml/m LA Vol (A4C):   31.5 ml 15.75 ml/m LA Biplane Vol: 36.7 ml 18.35 ml/m  AORTIC VALVE LVOT Vmax:   90.80 cm/s LVOT Vmean:  60.800 cm/s LVOT VTI:    0.164 m  AORTA Ao Root diam: 3.30 cm MITRAL VALVE MV Area (PHT): 3.21 cm     SHUNTS MV Decel Time: 236 msec     Systemic VTI:  0.16 m MV E velocity: 101.00 cm/s  Systemic Diam: 2.00 cm MV A velocity: 83.70 cm/s MV E/A ratio:  1.21 Eleonore Chiquito MD Electronically signed by Eleonore Chiquito MD Signature Date/Time: 05/24/2020/10:22:02 AM    Final         Scheduled Meds: . amLODipine  10 mg Oral Daily  . aspirin  325 mg Oral Daily  . carvedilol  25 mg Oral BID WC  . enoxaparin (LOVENOX) injection  40 mg Subcutaneous Q24H  . hydrALAZINE  50 mg Oral BID  . insulin aspart  0-15 Units Subcutaneous TID AC & HS  .  irbesartan  300 mg Oral Daily  . rosuvastatin  40 mg Oral Daily   Continuous Infusions: . sodium chloride 250 mL (05/24/20 1337)     LOS: 1 day        Hosie Poisson, MD [facility]   To contact the attending provider between 7A-7P or the covering provider during after hours 7P-7A, please log into the web site [email or web address] and access using universal [place] password for that web site. If you do not have the password, please call the hospital operator.  05/25/2020, 3:43 PM

## 2020-05-26 ENCOUNTER — Telehealth (HOSPITAL_COMMUNITY): Payer: Self-pay | Admitting: *Deleted

## 2020-05-26 ENCOUNTER — Other Ambulatory Visit: Payer: Self-pay

## 2020-05-26 ENCOUNTER — Ambulatory Visit (HOSPITAL_COMMUNITY)
Admit: 2020-05-26 | Discharge: 2020-05-26 | Disposition: A | Discharge status: Home / Self Care | Payer: 59 | Attending: Cardiovascular Disease | Admitting: Cardiovascular Disease

## 2020-05-26 DIAGNOSIS — R079 Chest pain, unspecified: Secondary | ICD-10-CM

## 2020-05-26 LAB — BASIC METABOLIC PANEL
Anion gap: 8 (ref 5–15)
BUN: 21 mg/dL — ABNORMAL HIGH (ref 6–20)
CO2: 22 mmol/L (ref 22–32)
Calcium: 9 mg/dL (ref 8.9–10.3)
Chloride: 102 mmol/L (ref 98–111)
Creatinine, Ser: 1.69 mg/dL — ABNORMAL HIGH (ref 0.44–1.00)
GFR calc Af Amer: 40 mL/min — ABNORMAL LOW (ref >60)
GFR calc non Af Amer: 35 mL/min — ABNORMAL LOW (ref >60)
Glucose, Bld: 230 mg/dL — ABNORMAL HIGH (ref 70–99)
Potassium: 4.2 mmol/L (ref 3.5–5.1)
Sodium: 132 mmol/L — ABNORMAL LOW (ref 135–145)

## 2020-05-26 LAB — GLUCOSE, CAPILLARY
Glucose-Capillary: 225 mg/dL — ABNORMAL HIGH (ref 70–99)
Glucose-Capillary: 181 mg/dL — ABNORMAL HIGH (ref 70–99)
Glucose-Capillary: 239 mg/dL — ABNORMAL HIGH (ref 70–99)
Glucose-Capillary: 208 mg/dL — ABNORMAL HIGH (ref 70–99)

## 2020-05-26 LAB — NM MYOCAR MULTI W/SPECT W/WALL MOTION / EF
MPHR: 170 {beats}/min
Peak HR: 106 {beats}/min
Percent HR: 62 %
Rest HR: 74 {beats}/min

## 2020-05-26 LAB — TROPONIN I (HIGH SENSITIVITY)
Troponin I (High Sensitivity): 13 ng/L (ref <18)
Troponin I (High Sensitivity): 16 ng/L (ref <18)

## 2020-05-26 MED ORDER — SODIUM CHLORIDE 0.9% FLUSH: 3.0000 mL | INTRAVENOUS | Status: DC | PRN

## 2020-05-26 MED ORDER — SODIUM CHLORIDE 0.9 % IV SOLN: INTRAVENOUS | Status: DC

## 2020-05-26 MED ORDER — HYDRALAZINE HCL 20 MG/ML IJ SOLN
INTRAMUSCULAR | Status: AC
  Filled 2020-05-26: qty 1

## 2020-05-26 MED ORDER — SODIUM CHLORIDE 0.9 % WEIGHT BASED INFUSION: 1.0000 mL/kg/h | INTRAVENOUS | Status: DC

## 2020-05-26 MED ORDER — SODIUM CHLORIDE 0.9% FLUSH
3.0000 mL | Freq: Two times a day (BID) | INTRAVENOUS | Status: DC
  Administered 2020-05-26 – 2020-05-27 (×2): 3 mL via INTRAVENOUS

## 2020-05-26 MED ORDER — FENTANYL CITRATE (PF) 100 MCG/2ML IJ SOLN
12.5000 ug | Freq: Four times a day (QID) | INTRAMUSCULAR | Status: DC | PRN
  Filled 2020-05-26: qty 2

## 2020-05-26 MED ORDER — SODIUM CHLORIDE 0.9 % IV SOLN: 250.0000 mL | INTRAVENOUS | Status: DC | PRN

## 2020-05-26 MED ORDER — SODIUM CHLORIDE 0.9 % WEIGHT BASED INFUSION
3.0000 mL/kg/h | INTRAVENOUS | Status: DC
  Administered 2020-05-27: 3 mL/kg/h via INTRAVENOUS

## 2020-05-26 MED ORDER — CHLORTHALIDONE 25 MG PO TABS
25.0000 mg | ORAL_TABLET | Freq: Every day | ORAL | Status: DC
  Administered 2020-05-26 – 2020-05-28 (×3): 25 mg via ORAL
  Filled 2020-05-26 (×3): qty 1

## 2020-05-26 MED ORDER — REGADENOSON 0.4 MG/5ML IV SOLN
0.4000 mg | Freq: Once | INTRAVENOUS | Status: AC
  Administered 2020-05-26: 0.4 mg via INTRAVENOUS

## 2020-05-26 MED ORDER — REGADENOSON 0.4 MG/5ML IV SOLN
INTRAVENOUS | Status: AC
  Filled 2020-05-26: qty 5

## 2020-05-26 MED ORDER — AMINOPHYLLINE 25 MG/ML IV SOLN
INTRAVENOUS | Status: AC
  Filled 2020-05-26: qty 10

## 2020-05-26 MED ORDER — ASPIRIN 81 MG PO CHEW
81.0000 mg | CHEWABLE_TABLET | ORAL | Status: AC
  Administered 2020-05-27: 81 mg via ORAL
  Filled 2020-05-26: qty 1

## 2020-05-26 MED ORDER — TECHNETIUM TC 99M TETROFOSMIN IV KIT
10.6000 | PACK | Freq: Once | INTRAVENOUS | Status: AC | PRN
  Administered 2020-05-26: 10.6 via INTRAVENOUS

## 2020-05-26 MED ORDER — NITROGLYCERIN 0.4 MG SL SUBL
SUBLINGUAL_TABLET | SUBLINGUAL | Status: AC
  Administered 2020-05-26: 0.4 mg
  Filled 2020-05-26: qty 1

## 2020-05-26 MED ORDER — INSULIN GLARGINE 100 UNIT/ML ~~LOC~~ SOLN
10.0000 [IU] | Freq: Every day | SUBCUTANEOUS | Status: DC
  Administered 2020-05-26 – 2020-05-28 (×3): 10 [IU] via SUBCUTANEOUS
  Filled 2020-05-26 (×4): qty 0.1

## 2020-05-26 MED ORDER — TRAMADOL HCL 50 MG PO TABS
50.0000 mg | ORAL_TABLET | Freq: Four times a day (QID) | ORAL | Status: DC | PRN
  Administered 2020-05-26: 50 mg via ORAL
  Filled 2020-05-26: qty 1

## 2020-05-26 MED ORDER — TECHNETIUM TC 99M TETROFOSMIN IV KIT
30.3000 | PACK | Freq: Once | INTRAVENOUS | Status: AC | PRN
  Administered 2020-05-26: 30.3 via INTRAVENOUS

## 2020-05-26 MED ORDER — NITROGLYCERIN 0.4 MG SL SUBL
0.4000 mg | SUBLINGUAL_TABLET | SUBLINGUAL | Status: AC | PRN
  Administered 2020-05-26 (×3): 0.4 mg via SUBLINGUAL

## 2020-05-26 MED ORDER — INSULIN GLARGINE 100 UNIT/ML ~~LOC~~ SOLN: 10.0000 [IU] | Freq: Every day | SUBCUTANEOUS | Status: DC

## 2020-05-26 MED ORDER — NITROGLYCERIN 0.4 MG SL SUBL
SUBLINGUAL_TABLET | SUBLINGUAL | Status: AC
  Filled 2020-05-26: qty 1

## 2020-05-26 MED ORDER — AMINOPHYLLINE 25 MG/ML IV (NUC MED): 75.0000 mg | Freq: Once | INTRAVENOUS | Status: DC

## 2020-05-26 NOTE — Progress Notes (Signed)
1620 Patient complained of sudden onset of non radiating burning chest pain 10/10. Placed on 2L O2 BP 164/90, HR 97, Resp. 20. EKG done. CP treated with NTG SLx3 with pain 3/10. PA Furth notified troponin ordered.

## 2020-05-26 NOTE — Progress Notes (Signed)
Progress Note  Patient Name: Casey Acosta Date of Encounter: 05/26/2020  [facility] Cardiologist: Kirk Ruths, MD   Subjective   Feeling better.  She continues to have a headache this morning despite stopping nitroglycerin.  She denies any chest pain or pressure.  She is still somewhat short of breath, though better than prior to admission.  Inpatient Medications    Scheduled Meds: . amLODipine  10 mg Oral Daily  . aspirin  325 mg Oral Daily  . carvedilol  25 mg Oral BID WC  . chlorthalidone  25 mg Oral Daily  . enoxaparin (LOVENOX) injection  40 mg Subcutaneous Q24H  . hydrALAZINE  100 mg Oral Q8H  . insulin aspart  0-15 Units Subcutaneous TID AC & HS  . irbesartan  300 mg Oral Daily  . rosuvastatin  40 mg Oral Daily   Continuous Infusions: . sodium chloride 250 mL (05/24/20 1337)   PRN Meds: sodium chloride, acetaminophen, hydrALAZINE, hydrOXYzine, ondansetron (ZOFRAN) IV, polyethylene glycol, traMADol   Vital Signs    Vitals:   05/25/20 1625 05/25/20 2110 05/26/20 0545 05/26/20 0828  BP: (!) 182/95 (!) 184/89 (!) 162/95 (!) 174/90  Pulse: 81 92 80 77  Resp: 16 18 18 16   Temp: 98.1 F (36.7 C) 98.4 F (36.9 C) 98.2 F (36.8 C) 98.4 F (36.9 C)  TempSrc: Oral Oral Oral Oral  SpO2: 99% 99% 99% 99%  Weight:      Height:        Intake/Output Summary (Last 24 hours) at 05/26/2020 0910 Last data filed at 05/25/2020 1900 Gross per 24 hour  Intake 480 ml  Output --  Net 480 ml   Last 3 Weights 05/24/2020 11/29/2019 11/03/2019  Weight (lbs) 200 lb 192 lb 190 lb  Weight (kg) 90.719 kg 87.091 kg 86.183 kg      Telemetry    Sinus rhythm - Personally Reviewed  ECG    05/24/20: Sinus rhythm.  Rate 70 bpm.  LVH with repolarization abnormalities.  - Personally Reviewed  Physical Exam   VS:  BP (!) 174/90 (BP Location: Left Arm)   Pulse 77   Temp 98.4 F (36.9 C) (Oral)   Resp 16   Ht 5\' 6"  (1.676 m)   Wt 90.7 kg   SpO2 99%   BMI 32.28 kg/m  ,  BMI Body mass index is 32.28 kg/m. GENERAL:  Well appearing HEENT: Pupils equal round and reactive, fundi not visualized, oral mucosa unremarkable NECK:  No jugular venous distention, waveform within normal limits, carotid upstroke brisk and symmetric, no bruits LUNGS:  Clear to auscultation bilaterally HEART:  RRR.  PMI not displaced or sustained,S1 and S2 within normal limits, no S3, no S4, no clicks, no rubs, no murmurs ABD:  Flat, positive bowel sounds normal in frequency in pitch, no bruits, no rebound, no guarding, no midline pulsatile mass, no hepatomegaly, no splenomegaly EXT:  2 plus pulses throughout, no edema, no cyanosis no clubbing SKIN:  No rashes no nodules NEURO:  Cranial nerves II through XII grossly intact, motor grossly intact throughout PSYCH:  Cognitively intact, oriented to person place and time  Labs    High Sensitivity Troponin:   Recent Labs  Lab 05/23/20 2356 05/24/20 0426 05/24/20 0948  TROPONINIHS 34* 32* 32*      Chemistry Recent Labs  Lab 05/23/20 2356 05/24/20 0948  NA 134* 135  K 3.8 3.5  CL 99 100  CO2 25 25  GLUCOSE 262* 197*  BUN 23* 19  CREATININE 1.78* 1.60*  CALCIUM 9.4 9.0  GFRNONAA 33* 37*  GFRAA 38* 43*  ANIONGAP 10 10     Hematology Recent Labs  Lab 05/23/20 2356 05/24/20 0948  WBC 12.4* 10.4  RBC 3.97 3.87  HGB 10.6* 10.5*  HCT 32.1* 31.3*  MCV 80.9 80.9  MCH 26.7 27.1  MCHC 33.0 33.5  RDW 14.0 14.2  PLT 366 305    BNP Recent Labs  Lab 05/24/20 0000  BNP 166.1*     DDimer No results for input(s): DDIMER in the last 168 hours.   Radiology    No results found.  Cardiac Studies   Echo 05/2020: 1. There is a small mass (0.6 cm x 0.6 cm) attached to the [place] or LCC of  the aortic valve (difficult to determine). This likely represents  calcified mass vs papillary fibroelastoma. There is no regurgitation or  significant destruction of the valve to  suggest this is vegetation. Clinical correlation is needed  and would  recommend TEE for better characterization. The aortic valve is tricuspid.  Aortic valve regurgitation is not visualized. No aortic stenosis is  present.  2. Left ventricular ejection fraction, by estimation, is 60 to 65%. The  left ventricle has normal function. The left ventricle has no regional  wall motion abnormalities. There is moderate concentric left ventricular  hypertrophy. Left ventricular  diastolic parameters are consistent with Grade II diastolic dysfunction  (pseudonormalization). Elevated left atrial pressure.  3. Right ventricular systolic function is normal. The right ventricular  size is normal. Tricuspid regurgitation signal is inadequate for assessing  PA pressure.  4. The mitral valve is grossly normal. Trivial mitral valve  regurgitation. No evidence of mitral stenosis.  5. The inferior vena cava is normal in size with greater than 50%  respiratory variability, suggesting right atrial pressure of 3 mmHg.   Patient Profile     49 y.o. female with hypertension, hyperlipidemia, CKD 3, none adherence, and diabetes admitted with hypertensive urgency and chest pain.  Assessment & Plan    # Chest pain:  # Elevated troponin:  Troponin minimally elevated to 34 and flat in the setting of hypertensive urgency.  Less likely that this represents ACS.  Plan for [facility] today.  # Hypertensive urgency: # Medical non-adherence:  Blood pressures remain poorly controlled, though improving.  Metoprolol was switched to carvedilol for improved rate control.  She was taking clonidine sporadically.  Given her labile hypertension we will have discontinued this medication.  Hydralazine was increased to 100 mg 3 times daily yesterday.  At home she was on Maxide but had difficulty getting this from the pharmacy.  We will start her on chlorthalidone 25 mg daily today.  Continue irbesartan and amlodipine.  Basic metabolic panel is pending.  We will need to monitor her  creatinine closely.  Renal artery Dopplers were negative 12/2019. Thyroid normal 10/2019.  Consider checking for hyperaldosteronism, though this cannot be done while she is on her ARB.  Given that her blood pressure still poorly controlled we will not hold it to allow for testing now but this can be considered as an outpatient if her blood pressure is better controlled.  If renal function stabilizes would also consider adding spironolactone.  Consider outpatient sleep study.  She would be a good candidate for the advance hypertension clinic if her blood pressures remain poorly controlled.  # Aortic valve mass:  Noted on transthoracic echo.  Very small (0.6 cm x 0.6 cm).  Concern for calcified  mass versus papillary fibroblastoma.  Plan for TEE on Monday.      For questions or updates, please contact [place] Please consult [email or web address] for contact info under        Signed, Skeet Latch, MD  05/26/2020, 9:10 AM

## 2020-05-26 NOTE — Progress Notes (Signed)
Called phlebtomy X5  No answer, spoke with person in Micro she transferred me to another number no answer.  Called main number of lab given additional number to call [phone].  No answer tried this number X 3.  Spoke with Clabe Seal twice regarding no answer from lab.  Also paged phlebotomy over head no cal back.  The second time I spoke with Estill Bamberg she asked that I call ED to see if they could assist with lab draw/.  I called Jamie Blue-Matthews at 1349 and asked if she could send someone to draw stat trop.  She said she would send someone in about 10 minutes.  1404 attempted cal to phlebotomy  [phone] no answer.  1407 call received from [phone] she does not have this location to draw labs.  Gave me the number [phone] .  I called this number spoke with Tyrell he will come to draw blood

## 2020-05-26 NOTE — Progress Notes (Signed)
   Casey Acosta presented for a  nuclear stress test today.  During the stress test patient became sob and had some chest discomfort, EKG without ischemic changes. After the test the chest pain got worse, up to 7/10. Oxygen was placed and about 1 minute later SL nitro x 1 was given. After 4-5 minutes BP was still high 174/103 and hydralazine 10mg  x1 was given. Chest pain began to improve however Bps dropped down to 88/61. Patient felt sob and felt hot. Patient was eventually given aminophylline with improvement of symptoms. BP eventually improved to 135/76. Stat troponin was ordered. Follow-up EKG looked stable. Dr. Oval Linsey was notified who suggested patient stay at [facility]. IM from [facility] was notified who put in transfer orders. Bed placement said a room could be ready in 1 hour.  Stress imaging is pending at this time.  Preliminary EKG findings may be listed in the chart, but the stress test result will not be finalized until perfusion imaging is complete.  [name], PA-C  05/26/2020, 1:06 PM

## 2020-05-26 NOTE — Progress Notes (Signed)
Patient brought to [facility] for stress test from [facility].  Pt had shortness of breath and CP during procedure.  1226 CP at 7.  Nitro given.  1229 Still with CP 5-6 BP elevatd Appresoline 10mg  per prn order.  At 1240 amionophline 75mg  to reverse lexiscan. Pt continues with CP 2-3 now.  Cadence Furth PA at bedside during this whole time.  Patient will be transferred to [facility] per MD request.

## 2020-05-26 NOTE — Progress Notes (Signed)
PROGRESS NOTE    Casey Acosta  GDJ:[id number] DOB: 07/28/1971 DOA: 05/23/2020 PCP: Billie Ruddy, MD    Chief Complaint  Patient presents with  . Chest Pain  . Shortness of Breath    Brief Narrative:   49 year old lady with prior h/o hypertension, hyperlipidemia, DM type 2, admitted for chest discomfort and hypertensive urgency.  On admission, her troponin s are mildly elevated. EKG shows left ventricular hypertrophy.  She continued to have intermittent chest pain, cardiology consulted for recommendations.  Patient's TIMI score is 3.  She is scheduled for cardiac catheterization and possible TEE on Monday.  Cardiology recommended stress imaging today due to sob, . She was transferred to  [facility] and during the stress test, pt became sob, had chest discomfort and hypertensive . She was given IV hydralazine, after which her BP dropped to 88/61 and she became symptomatic. She was given aminophylline with improvement of symptoms.  Cardiology recommended that pt stay at [facility] and undergo further evaluation.   Assessment & Plan:   Principal Problem:   Chest pain Active Problems:   Uncontrolled type 2 diabetes mellitus with diabetic nephropathy, without long-term current use of insulin (HCC)   Chronic kidney disease, stage 3b   Elevated troponin   Hypertensive emergency   Mixed diabetic hyperlipidemia associated with type 2 diabetes mellitus ([place])   Unstable angina Multiple risk factors including hypertension uncontrolled diabetes, hyperlipidemia, with exertional chest pain, mildly elevated troponins. Plan for cardiac catheterization on Monday. Patient's chest pain has resolved now.  She is on room air with good oxygen saturations.  But she reports she continues to have some subjective feeling of shortness of breath and some heaviness in the chest intermittently. Cardiology recommended stress imaging today due to sob, . She was transferred to  [facility] and during the stress test, pt became sob,  had chest discomfort and hypertensive . She was given IV hydralazine, after which her BP dropped to 88/61 and she became symptomatic. She was given aminophylline with improvement of symptoms.  Cardiology recommended that pt stay at [facility] and undergo further evaluation.    Uncontrolled diabetes mellitus. CBG (last 3)  Recent Labs    05/25/20 1630 05/25/20 2137 05/26/20 0800  GLUCAP 186* 304* 225*   Resume SSI.  Hemo-globin A1c at 10.8 Started the patient on Lantus.  Hold Metformin for now.   Hyperlipidemia Continue with Crestor 40 mg daily.  LDL at 121   Hypertensive urgency Blood pressure parameters not well controlled.  Patient on Coreg, hydralazine 50 mg twice daily and Avapro 300 mg daily.   Stage IIIb CKD Creatinine appears to be at baseline.    Anemia of chronic disease Baseline hemoglobin around 11 , currently hemoglobin around 10 and stable.  Mild hyponatremia:  Sodium of 132.    DVT prophylaxis: Lovenox Code Status: FULL CODE.  Family Communication: none at bedsdie.  Disposition:   Status is: Inpatient  Remains inpatient appropriate because:Ongoing diagnostic testing needed not appropriate for outpatient work up   Dispo: The patient is from: Home              Anticipated d/c is to: Home              Anticipated d/c date is: 2 days              Patient currently is not medically stable to d/c.       Consultants:   Cardiology.   Procedures: Echo.    Antimicrobials: none.  Subjective: some subjective feeling of sob.   Objective: Vitals:   05/25/20 1625 05/25/20 2110 05/26/20 0545 05/26/20 0828  BP: (!) 182/95 (!) 184/89 (!) 162/95 (!) 174/90  Pulse: 81 92 80 77  Resp: 16 18 18 16   Temp: 98.1 F (36.7 C) 98.4 F (36.9 C) 98.2 F (36.8 C) 98.4 F (36.9 C)  TempSrc: Oral Oral Oral Oral  SpO2: 99% 99% 99% 99%  Weight:      Height:        Intake/Output Summary (Last 24 hours) at 05/26/2020 1358 Last data filed at 05/25/2020  1900 Gross per 24 hour  Intake 480 ml  Output --  Net 480 ml   Filed Weights   05/24/20 0413  Weight: 90.7 kg    Examination:  General exam: alert and comfortable.  Not in distress Respiratory system: clear to auscultation, no wheezing or rhonchi.  Cardiovascular system: S1-S2 heard regular rate rhythm, no JVD Gastrointestinal system: Abdomen is soft, nontender, nondistended bowel sounds normal Central nervous system: Alert and oriented, grossly nonfocal. Extremities: No cyanosis or clubbing Skin: No rashes seen Psychiatry: Patient anxious    Data Reviewed: I have personally reviewed following labs and imaging studies  CBC: Recent Labs  Lab 05/23/20 2356 05/24/20 0948  WBC 12.4* 10.4  HGB 10.6* 10.5*  HCT 32.1* 31.3*  MCV 80.9 80.9  PLT 366 440    Basic Metabolic Panel: Recent Labs  Lab 05/23/20 2356 05/24/20 0948 05/26/20 0928  NA 134* 135 132*  K 3.8 3.5 4.2  CL 99 100 102  CO2 25 25 22   GLUCOSE 262* 197* 230*  BUN 23* 19 21*  CREATININE 1.78* 1.60* 1.69*  CALCIUM 9.4 9.0 9.0    GFR: Estimated Creatinine Clearance: 45.2 mL/min (A) (by C-G formula based on SCr of 1.69 mg/dL (H)).  Liver Function Tests: No results for input(s): AST, ALT, ALKPHOS, BILITOT, PROT, ALBUMIN in the last 168 hours.  CBG: Recent Labs  Lab 05/25/20 0723 05/25/20 1128 05/25/20 1630 05/25/20 2137 05/26/20 0800  GLUCAP 199* 256* 186* 304* 225*     Recent Results (from the past 240 hour(s))  Respiratory Panel by RT PCR (Flu A&B, Covid) - Nasopharyngeal Swab     Status: None   Collection Time: 05/24/20  2:48 AM   Specimen: Nasopharyngeal Swab  Result Value Ref Range Status   SARS Coronavirus 2 by RT PCR NEGATIVE NEGATIVE Final    Comment: (NOTE) SARS-CoV-2 target nucleic acids are NOT DETECTED.  The SARS-CoV-2 RNA is generally detectable in upper respiratoy specimens during the acute phase of infection. The lowest concentration of SARS-CoV-2 viral copies this assay  can detect is 131 copies/mL. A negative result does not preclude SARS-Cov-2 infection and should not be used as the sole basis for treatment or other patient management decisions. A negative result may occur with  improper specimen collection/handling, submission of specimen other than nasopharyngeal swab, presence of viral mutation(s) within the areas targeted by this assay, and inadequate number of viral copies (<131 copies/mL). A negative result must be combined with clinical observations, patient history, and epidemiological information. The expected result is Negative.  Fact Sheet for Patients:  PinkCheek.be  Fact Sheet for Healthcare Providers:  GravelBags.it  This test is no t yet approved or cleared by the [place] FDA and  has been authorized for detection and/or diagnosis of SARS-CoV-2 by FDA under an Emergency Use Authorization (EUA). This EUA will remain  in effect (meaning this test can be used) for the  duration of the COVID-19 declaration under Section 564(b)(1) of the Act, 21 U.S.C. section 360bbb-3(b)(1), unless the authorization is terminated or revoked sooner.     Influenza A by PCR NEGATIVE NEGATIVE Final   Influenza B by PCR NEGATIVE NEGATIVE Final    Comment: (NOTE) The Xpert Xpress SARS-CoV-2/FLU/RSV assay is intended as an aid in  the diagnosis of influenza from Nasopharyngeal swab specimens and  should not be used as a sole basis for treatment. Nasal washings and  aspirates are unacceptable for Xpert Xpress SARS-CoV-2/FLU/RSV  testing.  Fact Sheet for Patients: PinkCheek.be  Fact Sheet for Healthcare Providers: GravelBags.it  This test is not yet approved or cleared by the [place] FDA and  has been authorized for detection and/or diagnosis of SARS-CoV-2 by  FDA under an Emergency Use Authorization (EUA). This EUA will remain   in effect (meaning this test can be used) for the duration of the  Covid-19 declaration under Section 564(b)(1) of the Act, 21  U.S.C. section 360bbb-3(b)(1), unless the authorization is  terminated or revoked. Performed at [facility], [place] [address] [place], [place] [address]          Radiology Studies: No results found.      Scheduled Meds: . amLODipine  10 mg Oral Daily  . aspirin  325 mg Oral Daily  . carvedilol  25 mg Oral BID WC  . chlorthalidone  25 mg Oral Daily  . enoxaparin (LOVENOX) injection  40 mg Subcutaneous Q24H  . hydrALAZINE  100 mg Oral Q8H  . insulin aspart  0-15 Units Subcutaneous TID AC & HS  . insulin glargine  10 Units Subcutaneous Daily  . irbesartan  300 mg Oral Daily  . rosuvastatin  40 mg Oral Daily   Continuous Infusions: . sodium chloride 250 mL (05/24/20 1337)     LOS: 2 days        Hosie Poisson, MD [facility]   To contact the attending provider between 7A-7P or the covering provider during after hours 7P-7A, please log into the web site [email or web address] and access using universal [employer] password for that web site. If you do not have the password, please call the hospital operator.  05/26/2020, 1:58 PM

## 2020-05-27 ENCOUNTER — Encounter (HOSPITAL_COMMUNITY)
Admission: EM | Disposition: A | Discharge status: Home / Self Care | Payer: Self-pay | Source: Home / Self Care | Attending: Internal Medicine

## 2020-05-27 ENCOUNTER — Inpatient Hospital Stay (HOSPITAL_COMMUNITY): Payer: 59 | Admitting: Certified Registered"

## 2020-05-27 ENCOUNTER — Encounter (HOSPITAL_COMMUNITY): Payer: Self-pay | Admitting: Internal Medicine

## 2020-05-27 ENCOUNTER — Inpatient Hospital Stay (HOSPITAL_COMMUNITY): Payer: 59

## 2020-05-27 ENCOUNTER — Ambulatory Visit: Payer: 59 | Admitting: Internal Medicine

## 2020-05-27 DIAGNOSIS — R0600 Dyspnea, unspecified: Secondary | ICD-10-CM

## 2020-05-27 DIAGNOSIS — D151 Benign neoplasm of heart: Secondary | ICD-10-CM | POA: Insufficient documentation

## 2020-05-27 DIAGNOSIS — I10 Essential (primary) hypertension: Secondary | ICD-10-CM

## 2020-05-27 DIAGNOSIS — R55 Syncope and collapse: Secondary | ICD-10-CM

## 2020-05-27 HISTORY — PX: TEE WITHOUT CARDIOVERSION: SHX5443

## 2020-05-27 LAB — VITAMIN B12: Vitamin B-12: 396 pg/mL (ref 180–914)

## 2020-05-27 LAB — RETICULOCYTES
Immature Retic Fract: 20.2 % — ABNORMAL HIGH (ref 2.3–15.9)
RBC.: 4.17 MIL/uL (ref 3.87–5.11)
Retic Count, Absolute: 68 K/uL (ref 19.0–186.0)
Retic Ct Pct: 1.6 % (ref 0.4–3.1)

## 2020-05-27 LAB — IRON AND TIBC
Iron: 49 ug/dL (ref 28–170)
Saturation Ratios: 16 % (ref 10.4–31.8)
TIBC: 298 ug/dL (ref 250–450)
UIBC: 249 ug/dL

## 2020-05-27 LAB — BASIC METABOLIC PANEL
Anion gap: 11 (ref 5–15)
BUN: 19 mg/dL (ref 6–20)
CO2: 22 mmol/L (ref 22–32)
Calcium: 9.3 mg/dL (ref 8.9–10.3)
Chloride: 101 mmol/L (ref 98–111)
Creatinine, Ser: 1.91 mg/dL — ABNORMAL HIGH (ref 0.44–1.00)
GFR calc Af Amer: 35 mL/min — ABNORMAL LOW (ref >60)
GFR calc non Af Amer: 30 mL/min — ABNORMAL LOW (ref >60)
Glucose, Bld: 177 mg/dL — ABNORMAL HIGH (ref 70–99)
Potassium: 3.9 mmol/L (ref 3.5–5.1)
Sodium: 134 mmol/L — ABNORMAL LOW (ref 135–145)

## 2020-05-27 LAB — CBC
HCT: 34.1 % — ABNORMAL LOW (ref 36.0–46.0)
Hemoglobin: 11 g/dL — ABNORMAL LOW (ref 12.0–15.0)
MCH: 26.2 pg (ref 26.0–34.0)
MCHC: 32.3 g/dL (ref 30.0–36.0)
MCV: 81.2 fL (ref 80.0–100.0)
Platelets: 367 K/uL (ref 150–400)
RBC: 4.2 MIL/uL (ref 3.87–5.11)
RDW: 14.6 % (ref 11.5–15.5)
WBC: 10.5 K/uL (ref 4.0–10.5)
nRBC: 0 % (ref 0.0–0.2)

## 2020-05-27 LAB — ECHO TEE
AV Mean grad: 4 mmHg
AV Peak grad: 9.4 mmHg
Ao pk vel: 1.53 m/s

## 2020-05-27 LAB — FERRITIN: Ferritin: 193 ng/mL (ref 11–307)

## 2020-05-27 LAB — GLUCOSE, CAPILLARY
Glucose-Capillary: 213 mg/dL — ABNORMAL HIGH (ref 70–99)
Glucose-Capillary: 167 mg/dL — ABNORMAL HIGH (ref 70–99)
Glucose-Capillary: 308 mg/dL — ABNORMAL HIGH (ref 70–99)
Glucose-Capillary: 134 mg/dL — ABNORMAL HIGH (ref 70–99)

## 2020-05-27 LAB — PROTIME-INR
INR: 0.9 (ref 0.8–1.2)
Prothrombin Time: 12.1 seconds (ref 11.4–15.2)

## 2020-05-27 LAB — TSH
TSH: 1.089 u[IU]/mL (ref 0.350–4.500)
TSH: 1.148 u[IU]/mL (ref 0.350–4.500)

## 2020-05-27 LAB — FOLATE: Folate: 14.7 ng/mL (ref >5.9)

## 2020-05-27 SURGERY — ECHOCARDIOGRAM, TRANSESOPHAGEAL
Anesthesia: Monitor Anesthesia Care

## 2020-05-27 MED ORDER — HYDRALAZINE HCL 50 MG PO TABS
100.0000 mg | ORAL_TABLET | Freq: Four times a day (QID) | ORAL | Status: DC
  Administered 2020-05-27 – 2020-05-28 (×5): 100 mg via ORAL
  Filled 2020-05-27 (×5): qty 2

## 2020-05-27 MED ORDER — PROPOFOL 500 MG/50ML IV EMUL
INTRAVENOUS | Status: DC | PRN
  Administered 2020-05-27: 40 mg via INTRAVENOUS
  Administered 2020-05-27: 2 mg via INTRAVENOUS
  Administered 2020-05-27: 40 mg via INTRAVENOUS
  Administered 2020-05-27: 50 mg via INTRAVENOUS
  Administered 2020-05-27: 50 ug/kg/min via INTRAVENOUS

## 2020-05-27 MED ORDER — LIDOCAINE HCL (CARDIAC) PF 100 MG/5ML IV SOSY
PREFILLED_SYRINGE | INTRAVENOUS | Status: DC | PRN
  Administered 2020-05-27: 20 mg via INTRATRACHEAL

## 2020-05-27 NOTE — H&P (View-Only) (Signed)
Progress Note  Patient Name: Casey Acosta Date of Encounter: 05/27/2020  [facility] Cardiologist: Kirk Ruths, MD   Subjective   Denies any further chest pain and no SOB.  NPO this am for TEE to evaluate AV mass  Inpatient Medications    Scheduled Meds: . amLODipine  10 mg Oral Daily  . aspirin  325 mg Oral Daily  . carvedilol  25 mg Oral BID WC  . chlorthalidone  25 mg Oral Daily  . enoxaparin (LOVENOX) injection  40 mg Subcutaneous Q24H  . hydrALAZINE  100 mg Oral Q8H  . insulin aspart  0-15 Units Subcutaneous TID AC & HS  . insulin glargine  10 Units Subcutaneous Daily  . irbesartan  300 mg Oral Daily  . rosuvastatin  40 mg Oral Daily  . sodium chloride flush  3 mL Intravenous Q12H   Continuous Infusions: . sodium chloride    . sodium chloride    . sodium chloride 250 mL (05/24/20 1337)  . sodium chloride 1 mL/kg/hr (05/27/20 0718)   PRN Meds: sodium chloride, sodium chloride, acetaminophen, fentaNYL (SUBLIMAZE) injection, hydrALAZINE, hydrOXYzine, ondansetron (ZOFRAN) IV, polyethylene glycol, sodium chloride flush, traMADol   Vital Signs    Vitals:   05/26/20 1829 05/26/20 2000 05/26/20 2129 05/27/20 0545  BP: (!) 157/88 (!) 170/96 (!) 144/72 (!) 174/97  Pulse: 84 82  86  Resp:    15  Temp:  98.5 F (36.9 C)  98.3 F (36.8 C)  TempSrc:  Oral  Oral  SpO2: 98% 97%  99%  Weight:    90.6 kg  Height:        Intake/Output Summary (Last 24 hours) at 05/27/2020 0814 Last data filed at 05/26/2020 2000 Gross per 24 hour  Intake 360 ml  Output --  Net 360 ml   Last 3 Weights 05/27/2020 05/26/2020 05/24/2020  Weight (lbs) 199 lb 12.8 oz 199 lb 11.8 oz 200 lb  Weight (kg) 90.629 kg 90.6 kg 90.719 kg      Telemetry    NSR - Personally Reviewed  ECG    No new EKG to review.  - Personally Reviewed  Physical Exam   VS:  BP (!) 174/97 (BP Location: Right Arm)   Pulse 86   Temp 98.3 F (36.8 C) (Oral)   Resp 15   Ht 5\' 6"  (1.676 m)   Wt 90.6 kg    SpO2 99%   BMI 32.25 kg/m  , BMI Body mass index is 32.25 kg/m.  GEN: Well nourished, well developed in no acute distress HEENT: Normal NECK: No JVD; No carotid bruits LYMPHATICS: No lymphadenopathy CARDIAC:RRR, no murmurs, rubs, gallops RESPIRATORY:  Clear to auscultation without rales, wheezing or rhonchi  ABDOMEN: Soft, non-tender, non-distended MUSCULOSKELETAL:  No edema; No deformity  SKIN: Warm and dry NEUROLOGIC:  Alert and oriented x 3 PSYCHIATRIC:  Normal affect    Labs    High Sensitivity Troponin:   Recent Labs  Lab 05/23/20 2356 05/24/20 0426 05/24/20 0948 05/26/20 1702 05/26/20 1753  TROPONINIHS 34* 32* 32* 13 16      Chemistry Recent Labs  Lab 05/23/20 2356 05/24/20 0948 05/26/20 0928  NA 134* 135 132*  K 3.8 3.5 4.2  CL 99 100 102  CO2 25 25 22   GLUCOSE 262* 197* 230*  BUN 23* 19 21*  CREATININE 1.78* 1.60* 1.69*  CALCIUM 9.4 9.0 9.0  GFRNONAA 33* 37* 35*  GFRAA 38* 43* 40*  ANIONGAP 10 10 8      Hematology  Recent Labs  Lab 05/23/20 2356 05/24/20 0948  WBC 12.4* 10.4  RBC 3.97 3.87  HGB 10.6* 10.5*  HCT 32.1* 31.3*  MCV 80.9 80.9  MCH 26.7 27.1  MCHC 33.0 33.5  RDW 14.0 14.2  PLT 366 305    BNP Recent Labs  Lab 05/24/20 0000  BNP 166.1*     DDimer No results for input(s): DDIMER in the last 168 hours.   Radiology    NM Myocar Multi W/Spect W/Wall Motion / EF  Result Date: 05/26/2020 CLINICAL DATA:  Chest pain EXAM: MYOCARDIAL IMAGING WITH SPECT (REST AND PHARMACOLOGIC-STRESS) GATED LEFT VENTRICULAR WALL MOTION STUDY LEFT VENTRICULAR EJECTION FRACTION TECHNIQUE: Standard myocardial SPECT imaging was performed after resting intravenous injection of 10 mCi Tc-70m tetrofosmin. Subsequently, intravenous infusion of Lexiscan was performed under the supervision of the Cardiology staff. At peak effect of the drug, 30 mCi Tc-65m tetrofosmin was injected intravenously and standard myocardial SPECT imaging was performed.  Quantitative gated imaging was also performed to evaluate left ventricular wall motion, and estimate left ventricular ejection fraction. COMPARISON:  None. FINDINGS: Perfusion: No significant decreased activity in the left ventricle on stress imaging to suggest reversible ischemia or infarction. Wall Motion: Mild global hypokinesis Left Ventricular Ejection Fraction: 43 % End diastolic volume 355 ml End systolic volume 65 ml IMPRESSION: 1. No significant reversible ischemia or infarction. 2. Mild global hypokinesis. 3. Left ventricular ejection fraction 43% 4. Non invasive risk stratification*: Intermediate *2012 Appropriate Use Criteria for Coronary Revascularization Focused Update: J Am Coll Cardiol. 7322;02(5):427-062. http://content.airportbarriers.com.aspx?articleid=[email or web address] Electronically Signed   By: Jerilynn Mages.  Shick M.D.   On: 05/26/2020 14:44    Cardiac Studies   Echo 05/2020: 1. There is a small mass (0.6 cm x 0.6 cm) attached to the [place] or LCC of  the aortic valve (difficult to determine). This likely represents  calcified mass vs papillary fibroelastoma. There is no regurgitation or  significant destruction of the valve to  suggest this is vegetation. Clinical correlation is needed and would  recommend TEE for better characterization. The aortic valve is tricuspid.  Aortic valve regurgitation is not visualized. No aortic stenosis is  present.  2. Left ventricular ejection fraction, by estimation, is 60 to 65%. The  left ventricle has normal function. The left ventricle has no regional  wall motion abnormalities. There is moderate concentric left ventricular  hypertrophy. Left ventricular  diastolic parameters are consistent with Grade II diastolic dysfunction  (pseudonormalization). Elevated left atrial pressure.  3. Right ventricular systolic function is normal. The right ventricular  size is normal. Tricuspid regurgitation signal is inadequate for assessing  PA pressure.  4. The  mitral valve is grossly normal. Trivial mitral valve  regurgitation. No evidence of mitral stenosis.  5. The inferior vena cava is normal in size with greater than 50%  respiratory variability, suggesting right atrial pressure of 3 mmHg.   Patient Profile     49 y.o. female with hypertension, hyperlipidemia, CKD 3, none adherence, and diabetes admitted with hypertensive urgency and chest pain.  Assessment & Plan    # Chest pain -had CP during her nuclear stress test and later in the afternoon that was burning but no CP since then -minimal elevation with flat trend in hsTrop felt to be demand ischemia in the setting of hypertensive urgency -2D echo with normal LVF and EF 60-65% -nuclear stress test yesterday with no ischemia -needs aggressive BP control  # Elevated troponin -see #1 -Troponin minimally elevated to 34 and flat in the  setting of hypertensive urgency.  Less likely that this represents ACS.  [facility] Adam Myoview with no ischemia (EF read as mildly reduced but EF on echo is normal)  # Hypertensive urgency -BP remains elevated at 174/82mmHg this am -continue amlodipine 10mg  daily, Carvedilol 25mg  BID, Irbesartan 300mg  daily and Chlorthalidone 25mg  daily -She was taking clonidine sporadically, but given her labile hypertension it was discontinued -given that her BP is elevated still and SCr remains elevated would not push chlorthalidone higher and would avoid Aldactone -will increase Hydralazine to 100mg  QID -will get outpt sleep study  # Medical non-adherence:  -Blood pressures remain poorly controlled, though improving.   -Metoprolol was switched to carvedilol for improved rate control.    -Hydralazine was increased to 100 mg 3 times daily yesterday but BP remains elevated so will increase to 100mg  QID -At home she was on Maxide but had difficulty getting this from the pharmacy so she was started  on chlorthalidone 25 mg daily today.   -SCr elevated so would not push  diuretic -Continue irbesartan and amlodipine. -Renal artery Dopplers were negative 12/2019.  -Thyroid normal 10/2019.   -Consider checking for hyperaldosteronism, though this cannot be done while she is on her ARB. -Given that her blood pressure still poorly controlled we will not hold it to allow for testing now but this can be considered as an outpatient if her blood pressure is better controlled.    # Aortic valve mass:  Noted on transthoracic echo.  Very small (0.6 cm x 0.6 cm).   -Concern for calcified mass versus papillary fibroblastoma.   -Plan for TEE today   I have spent a total of 35 minutes with patient reviewing 2D echo, nuclear stress test , telemetry, EKGs, labs and examining patient as well as establishing an assessment and plan that was discussed with the patient.  > 50% of time was spent in direct patient care.    For questions or updates, please contact [place] Please consult [email or web address] for contact info under        Signed, Fransico Him, MD  05/27/2020, 8:14 AM

## 2020-05-27 NOTE — Transfer of Care (Signed)
Immediate Anesthesia Transfer of Care Note  Patient: Casey Acosta  Procedure(s) Performed: TRANSESOPHAGEAL ECHOCARDIOGRAM (TEE) (N/A )  Patient Location: Endoscopy Unit  Anesthesia Type:MAC  Level of Consciousness: awake and sedated  Airway & Oxygen Therapy: Patient connected to nasal cannula oxygen  Post-op Assessment: Post -op Vital signs reviewed and stable  Post vital signs: stable  Last Vitals:  Vitals Value Taken Time  BP    Temp    Pulse    Resp    SpO2      Last Pain:  Vitals:   05/27/20 0835  TempSrc: Oral  PainSc: 0-No pain      Patients Stated Pain Goal: 0 (79/72/82 0601)  Complications: No complications documented.

## 2020-05-27 NOTE — Progress Notes (Signed)
Progress Note  Patient Name: Casey Acosta Date of Encounter: 05/27/2020  [facility] Cardiologist: Kirk Ruths, MD   Subjective   Denies any further chest pain and no SOB.  NPO this am for TEE to evaluate AV mass  Inpatient Medications    Scheduled Meds: . amLODipine  10 mg Oral Daily  . aspirin  325 mg Oral Daily  . carvedilol  25 mg Oral BID WC  . chlorthalidone  25 mg Oral Daily  . enoxaparin (LOVENOX) injection  40 mg Subcutaneous Q24H  . hydrALAZINE  100 mg Oral Q8H  . insulin aspart  0-15 Units Subcutaneous TID AC & HS  . insulin glargine  10 Units Subcutaneous Daily  . irbesartan  300 mg Oral Daily  . rosuvastatin  40 mg Oral Daily  . sodium chloride flush  3 mL Intravenous Q12H   Continuous Infusions: . sodium chloride    . sodium chloride    . sodium chloride 250 mL (05/24/20 1337)  . sodium chloride 1 mL/kg/hr (05/27/20 0718)   PRN Meds: sodium chloride, sodium chloride, acetaminophen, fentaNYL (SUBLIMAZE) injection, hydrALAZINE, hydrOXYzine, ondansetron (ZOFRAN) IV, polyethylene glycol, sodium chloride flush, traMADol   Vital Signs    Vitals:   05/26/20 1829 05/26/20 2000 05/26/20 2129 05/27/20 0545  BP: (!) 157/88 (!) 170/96 (!) 144/72 (!) 174/97  Pulse: 84 82  86  Resp:    15  Temp:  98.5 F (36.9 C)  98.3 F (36.8 C)  TempSrc:  Oral  Oral  SpO2: 98% 97%  99%  Weight:    90.6 kg  Height:        Intake/Output Summary (Last 24 hours) at 05/27/2020 0814 Last data filed at 05/26/2020 2000 Gross per 24 hour  Intake 360 ml  Output --  Net 360 ml   Last 3 Weights 05/27/2020 05/26/2020 05/24/2020  Weight (lbs) 199 lb 12.8 oz 199 lb 11.8 oz 200 lb  Weight (kg) 90.629 kg 90.6 kg 90.719 kg      Telemetry    NSR - Personally Reviewed  ECG    No new EKG to review.  - Personally Reviewed  Physical Exam   VS:  BP (!) 174/97 (BP Location: Right Arm)   Pulse 86   Temp 98.3 F (36.8 C) (Oral)   Resp 15   Ht 5\' 6"  (1.676 m)   Wt 90.6 kg    SpO2 99%   BMI 32.25 kg/m  , BMI Body mass index is 32.25 kg/m.  GEN: Well nourished, well developed in no acute distress HEENT: Normal NECK: No JVD; No carotid bruits LYMPHATICS: No lymphadenopathy CARDIAC:RRR, no murmurs, rubs, gallops RESPIRATORY:  Clear to auscultation without rales, wheezing or rhonchi  ABDOMEN: Soft, non-tender, non-distended MUSCULOSKELETAL:  No edema; No deformity  SKIN: Warm and dry NEUROLOGIC:  Alert and oriented x 3 PSYCHIATRIC:  Normal affect    Labs    High Sensitivity Troponin:   Recent Labs  Lab 05/23/20 2356 05/24/20 0426 05/24/20 0948 05/26/20 1702 05/26/20 1753  TROPONINIHS 34* 32* 32* 13 16      Chemistry Recent Labs  Lab 05/23/20 2356 05/24/20 0948 05/26/20 0928  NA 134* 135 132*  K 3.8 3.5 4.2  CL 99 100 102  CO2 25 25 22   GLUCOSE 262* 197* 230*  BUN 23* 19 21*  CREATININE 1.78* 1.60* 1.69*  CALCIUM 9.4 9.0 9.0  GFRNONAA 33* 37* 35*  GFRAA 38* 43* 40*  ANIONGAP 10 10 8      Hematology  Recent Labs  Lab 05/23/20 2356 05/24/20 0948  WBC 12.4* 10.4  RBC 3.97 3.87  HGB 10.6* 10.5*  HCT 32.1* 31.3*  MCV 80.9 80.9  MCH 26.7 27.1  MCHC 33.0 33.5  RDW 14.0 14.2  PLT 366 305    BNP Recent Labs  Lab 05/24/20 0000  BNP 166.1*     DDimer No results for input(s): DDIMER in the last 168 hours.   Radiology    NM Myocar Multi W/Spect W/Wall Motion / EF  Result Date: 05/26/2020 CLINICAL DATA:  Chest pain EXAM: MYOCARDIAL IMAGING WITH SPECT (REST AND PHARMACOLOGIC-STRESS) GATED LEFT VENTRICULAR WALL MOTION STUDY LEFT VENTRICULAR EJECTION FRACTION TECHNIQUE: Standard myocardial SPECT imaging was performed after resting intravenous injection of 10 mCi Tc-19m tetrofosmin. Subsequently, intravenous infusion of Lexiscan was performed under the supervision of the Cardiology staff. At peak effect of the drug, 30 mCi Tc-52m tetrofosmin was injected intravenously and standard myocardial SPECT imaging was performed.  Quantitative gated imaging was also performed to evaluate left ventricular wall motion, and estimate left ventricular ejection fraction. COMPARISON:  None. FINDINGS: Perfusion: No significant decreased activity in the left ventricle on stress imaging to suggest reversible ischemia or infarction. Wall Motion: Mild global hypokinesis Left Ventricular Ejection Fraction: 43 % End diastolic volume 268 ml End systolic volume 65 ml IMPRESSION: 1. No significant reversible ischemia or infarction. 2. Mild global hypokinesis. 3. Left ventricular ejection fraction 43% 4. Non invasive risk stratification*: Intermediate *2012 Appropriate Use Criteria for Coronary Revascularization Focused Update: J Am Coll Cardiol. 3419;62(2):297-989. http://[email or web address] Electronically Signed   By: Jerilynn Mages.  Shick M.D.   On: 05/26/2020 14:44    Cardiac Studies   Echo 05/2020: 1. There is a small mass (0.6 cm x 0.6 cm) attached to the [place] or LCC of  the aortic valve (difficult to determine). This likely represents  calcified mass vs papillary fibroelastoma. There is no regurgitation or  significant destruction of the valve to  suggest this is vegetation. Clinical correlation is needed and would  recommend TEE for better characterization. The aortic valve is tricuspid.  Aortic valve regurgitation is not visualized. No aortic stenosis is  present.  2. Left ventricular ejection fraction, by estimation, is 60 to 65%. The  left ventricle has normal function. The left ventricle has no regional  wall motion abnormalities. There is moderate concentric left ventricular  hypertrophy. Left ventricular  diastolic parameters are consistent with Grade II diastolic dysfunction  (pseudonormalization). Elevated left atrial pressure.  3. Right ventricular systolic function is normal. The right ventricular  size is normal. Tricuspid regurgitation signal is inadequate for assessing  PA pressure.  4. The  mitral valve is grossly normal. Trivial mitral valve  regurgitation. No evidence of mitral stenosis.  5. The inferior vena cava is normal in size with greater than 50%  respiratory variability, suggesting right atrial pressure of 3 mmHg.   Patient Profile     49 y.o. female with hypertension, hyperlipidemia, CKD 3, none adherence, and diabetes admitted with hypertensive urgency and chest pain.  Assessment & Plan    # Chest pain -had CP during her nuclear stress test and later in the afternoon that was burning but no CP since then -minimal elevation with flat trend in hsTrop felt to be demand ischemia in the setting of hypertensive urgency -2D echo with normal LVF and EF 60-65% -nuclear stress test yesterday with no ischemia -needs aggressive BP control  # Elevated troponin -see #1 -Troponin minimally elevated to 34 and flat in the  setting of hypertensive urgency.  Less likely that this represents ACS.  [place] Adam Myoview with no ischemia (EF read as mildly reduced but EF on echo is normal)  # Hypertensive urgency -BP remains elevated at 174/54mmHg this am -continue amlodipine 10mg  daily, Carvedilol 25mg  BID, Irbesartan 300mg  daily and Chlorthalidone 25mg  daily -She was taking clonidine sporadically, but given her labile hypertension it was discontinued -given that her BP is elevated still and SCr remains elevated would not push chlorthalidone higher and would avoid Aldactone -will increase Hydralazine to 100mg  QID -will get outpt sleep study  # Medical non-adherence:  -Blood pressures remain poorly controlled, though improving.   -Metoprolol was switched to carvedilol for improved rate control.    -Hydralazine was increased to 100 mg 3 times daily yesterday but BP remains elevated so will increase to 100mg  QID -At home she was on Maxide but had difficulty getting this from the pharmacy so she was started  on chlorthalidone 25 mg daily today.   -SCr elevated so would not push  diuretic -Continue irbesartan and amlodipine. -Renal artery Dopplers were negative 12/2019.  -Thyroid normal 10/2019.   -Consider checking for hyperaldosteronism, though this cannot be done while she is on her ARB. -Given that her blood pressure still poorly controlled we will not hold it to allow for testing now but this can be considered as an outpatient if her blood pressure is better controlled.    # Aortic valve mass:  Noted on transthoracic echo.  Very small (0.6 cm x 0.6 cm).   -Concern for calcified mass versus papillary fibroblastoma.   -Plan for TEE today   I have spent a total of 35 minutes with patient reviewing 2D echo, nuclear stress test , telemetry, EKGs, labs and examining patient as well as establishing an assessment and plan that was discussed with the patient.  > 50% of time was spent in direct patient care.    For questions or updates, please contact [place] Please consult [email or web address] for contact info under        Signed, Fransico Him, MD  05/27/2020, 8:14 AM

## 2020-05-27 NOTE — Anesthesia Preprocedure Evaluation (Signed)
Anesthesia Evaluation  Patient identified by MRN, date of birth, ID band Patient awake    Reviewed: Allergy & Precautions, NPO status , Patient's Chart, lab work & pertinent test results, reviewed documented beta blocker date and time   History of Anesthesia Complications Negative for: history of anesthetic complications  Airway Mallampati: III  TM Distance: >3 FB Neck ROM: Full    Dental  (+) Dental Advisory Given, Partial Upper   Pulmonary shortness of breath,  Covid-19 Nucleic Acid Test Results Lab Results      Component                Value               Date                      SARSCOV2NAA              NEGATIVE            05/24/2020              breath sounds clear to auscultation       Cardiovascular hypertension, Pt. on medications and Pt. on home beta blockers + angina (-) CHF  Rhythm:Regular  Low risk stress over weekend per cardiologist   Neuro/Psych PSYCHIATRIC DISORDERS Anxiety Depression negative neurological ROS     GI/Hepatic negative GI ROS, Neg liver ROS,   Endo/Other  diabetes, Type 2  Renal/GU Renal InsufficiencyRenal diseaseLab Results      Component                Value               Date                      CREATININE               1.69 (H)            05/26/2020           Lab Results      Component                Value               Date                      K                        4.2                 05/26/2020                Musculoskeletal   Abdominal   Peds  Hematology  (+) Blood dyscrasia, anemia , Lab Results      Component                Value               Date                      WBC                      10.4                05/24/2020                HGB  10.5 (L)            05/24/2020                HCT                      31.3 (L)            05/24/2020                MCV                      80.9                05/24/2020                PLT                       305                 05/24/2020              Anesthesia Other Findings   Reproductive/Obstetrics                             Anesthesia Physical Anesthesia Plan  ASA: II  Anesthesia Plan: MAC   Post-op Pain Management:    Induction: Intravenous  PONV Risk Score and Plan: 2 and Propofol infusion and Treatment may vary due to age or medical condition  Airway Management Planned: Simple Face Mask  Additional Equipment: None  Intra-op Plan:   Post-operative Plan:   Informed Consent: I have reviewed the patients History and Physical, chart, labs and discussed the procedure including the risks, benefits and alternatives for the proposed anesthesia with the patient or authorized representative who has indicated his/her understanding and acceptance.     Dental advisory given  Plan Discussed with: CRNA  Anesthesia Plan Comments:         Anesthesia Quick Evaluation

## 2020-05-27 NOTE — CV Procedure (Signed)
TEE: Anesthesia: [place] Propofol  See full report in [place] Nodular calcification of edge of left coronary cusp of AV No independent motion cannot r/o small calcified fibroelastoma But more likely just nodular calcification Trivial AR.   Trivial MR EF 60-65%  Lipomatous hypertrophy of atrial septum No PFO No LAA thrombus  Jenkins Rouge MD [facility]

## 2020-05-27 NOTE — Progress Notes (Signed)
  Echocardiogram Echocardiogram Transesophageal has been performed.  Casey Acosta 05/27/2020, 10:20 AM

## 2020-05-27 NOTE — Progress Notes (Addendum)
Inpatient Diabetes Program Recommendations  AACE/ADA: New Consensus Statement on Inpatient Glycemic Control (2015)  Target Ranges:  Prepandial:   less than 140 mg/dL      Peak postprandial:   less than 180 mg/dL (1-2 hours)      Critically ill patients:  140 - 180 mg/dL   Lab Results  Component Value Date   GLUCAP 167 (H) 05/27/2020   HGBA1C 10.8 (H) 05/24/2020    Review of Glycemic Control Results for Casey Acosta, Casey Acosta (MRN [id number]) as of 05/27/2020 13:26  Ref. Range 05/26/2020 15:05 05/26/2020 18:38 05/26/2020 20:46 05/27/2020 07:59 05/27/2020 10:58  Glucose-Capillary Latest Ref Range: 70 - 99 mg/dL 181 (H) 239 (H) 208 (H) 213 (H) 167 (H)   Diabetes history:  DM2 Outpatient Diabetes medications:  Ozempic 0.5 weekly (not taking) Metformin 500 mg daily Current orders for Inpatient glycemic control:  Lantus 10 units daily Novolog 0-15 units tid   Inpatient Diabetes Program Recommendations:     Please consider, Lantus 12 units daily   Addendum @ 7209- Spoke with patient at bedside.  Verified above home medications,  She does not take Ozempic because it is too expensive with her insurance.  Reviewed patient's current A1c of 10.8% (average blood sugar of 260 mg/dL). Explained what a A1c is and what it measures. Also reviewed goal A1c with patient, importance of good glucose control @ home, and blood sugar goals.  Discussed long and short term complications of uncontrolled glucose.    She drinks a lot of regular sodas and she eats a lot of CHO's such as breads and pastas.  Reviewed the Plate Method and goal CHO's per meal.  She verbalizes understanding of importance of diet adjustments and states she will switch to diet drinks and limit her CHO's.  Explained that once she is medically cleared and feels better a 15-20 walk 4-5 times a week can also help decrease her blood sugar.    Might consider discharging on Amaryl 2 mg as it is affordable and she can likely make several diet  adjustments to help decrease A1C.      Will continue to follow while inpatient.  Thank you, Reche Dixon, RN, BSN Diabetes Coordinator Inpatient Diabetes Program [phone] (team pager from 8a-5p)

## 2020-05-27 NOTE — Progress Notes (Signed)
PROGRESS NOTE  Casey Acosta PPJ:[id number] DOB: 31-Jan-1971 DOA: 05/23/2020 PCP: Billie Ruddy, MD   LOS: 3 days   Brief narrative: As per HPI,  49 year old female with prior history of hypertension, hyperlipidemia, DM type 2 presented to hospital with complaint of chest discomfort and hypertensive urgency.  Her troponins were initially elevated and EKG showed mild LVH.  Patient continued to have intermittent chest pain so cardiology was consulted.  Cardiology recommended a stress test.  Assessment/Plan:  Principal Problem:   Chest pain Active Problems:   Uncontrolled type 2 diabetes mellitus with diabetic nephropathy, without long-term current use of insulin (HCC)   Chronic kidney disease, stage 3b   Elevated troponin   Hypertensive emergency   Mixed diabetic hyperlipidemia associated with type 2 diabetes mellitus ([place])  Chest pain with mildly elevated troponin, dsypnea. Patient with multiple cardiac risk factors.   Cardiology on board.  Multiple risk factors including hypertension uncontrolled diabetes, hyperlipidemia.  Left ventricular  ejection fraction of 60 to 65%.  Nuclear stress test without no evidence of ischemia.  2D echo showed possibility of calcified mass versus papillary fibroblastoma and mass at aortic valve.  Patient is undergoing TEE today.  Troponins were flat.  Diabetes mellitus type 2., Hemoglobin A1c of 10.8.  On Lantus.  Hold Metformin.  Hyperlipidemia.  Continue Crestor.  Hypertensive urgency.  Continue Coreg, hydralazine and Avapro.  Blood pressure medications have been adjusted.  Blood pressure of 177/99 will benefit from outpatient sleep study as well.  Cardiology planning for evaluation of hyperaldosteronism.  Medication noncompliance.  Difficulty obtaining medication.  CKD stage IIIb.  Creatinine of 1.9 today.  Anemia of chronic kidney disease.  Baseline hemoglobin of 10.5.  Closely monitor.  Iron profile has been sent.  Mild hyponatremia.   Sodium of 132.  Monitor BMP in a.m.  DVT prophylaxis: enoxaparin (LOVENOX) injection 40 mg Start: 05/24/20 1000   Code Status: Full code  Family Communication: Family at bedside  Status is: Inpatient  Remains inpatient appropriate because:Inpatient level of care appropriate due to severity of illness   Dispo: The patient is from: Home              Anticipated d/c is to: Home              Anticipated d/c date is: 1 day, await cardiology opinion on further work-up and treatment.              Patient currently is not medically stable to d/c.   Consultants:  Cardiology  Procedures:  TEE  Antibiotics:  . None  Anti-infectives (From admission, onward)   None     Subjective: Today, patient was seen and examined at bedside.  Complains of subjective dyspnea.  Denies any dizziness, chest pain at this time.  Objective: Vitals:   05/27/20 0835 05/27/20 0841  BP: (!) 196/92 (!) 185/93  Pulse: 79   Resp: 19   Temp: 99 F (37.2 C)   SpO2: 99%     Intake/Output Summary (Last 24 hours) at 05/27/2020 0849 Last data filed at 05/26/2020 2000 Gross per 24 hour  Intake 360 ml  Output --  Net 360 ml   Filed Weights   05/24/20 0413 05/26/20 1400 05/27/20 0545  Weight: 90.7 kg 90.6 kg 90.6 kg   Body mass index is 32.25 kg/m.   Physical Exam:  GENERAL: Patient is alert awake and oriented. Not in obvious distress, obese HENT: No scleral pallor or icterus. Pupils equally reactive to light.  Oral mucosa is moist NECK: is supple, no gross swelling noted. CHEST: Clear to auscultation. No crackles or wheezes.  Diminished breath sounds bilaterally. CVS: S1 and S2 heard, no murmur. Regular rate and rhythm.  ABDOMEN: Soft, non-tender, bowel sounds are present. EXTREMITIES: No edema. CNS: Cranial nerves are intact. No focal motor deficits. SKIN: warm and dry without rashes.  Data Review: I have personally reviewed the following laboratory data and studies,  CBC: Recent Labs   Lab 28-May-2020 2356 05/24/20 0948  WBC 12.4* 10.4  HGB 10.6* 10.5*  HCT 32.1* 31.3*  MCV 80.9 80.9  PLT 366 606   Basic Metabolic Panel: Recent Labs  Lab May 28, 2020 2356 05/24/20 0948 05/26/20 0928  NA 134* 135 132*  K 3.8 3.5 4.2  CL 99 100 102  CO2 25 25 22   GLUCOSE 262* 197* 230*  BUN 23* 19 21*  CREATININE 1.78* 1.60* 1.69*  CALCIUM 9.4 9.0 9.0   Liver Function Tests: No results for input(s): AST, ALT, ALKPHOS, BILITOT, PROT, ALBUMIN in the last 168 hours. No results for input(s): LIPASE, AMYLASE in the last 168 hours. No results for input(s): AMMONIA in the last 168 hours. Cardiac Enzymes: No results for input(s): CKTOTAL, CKMB, CKMBINDEX, TROPONINI in the last 168 hours. BNP (last 3 results) Recent Labs    05/24/20 0000  BNP 166.1*    ProBNP (last 3 results) No results for input(s): PROBNP in the last 8760 hours.  CBG: Recent Labs  Lab 05/26/20 0800 05/26/20 1505 05/26/20 1838 05/26/20 2046 05/27/20 0759  GLUCAP 225* 181* 239* 208* 213*   Recent Results (from the past 240 hour(s))  Respiratory Panel by RT PCR (Flu A&B, Covid) - Nasopharyngeal Swab     Status: None   Collection Time: 05/24/20  2:48 AM   Specimen: Nasopharyngeal Swab  Result Value Ref Range Status   SARS Coronavirus 2 by RT PCR NEGATIVE NEGATIVE Final    Comment: (NOTE) SARS-CoV-2 target nucleic acids are NOT DETECTED.  The SARS-CoV-2 RNA is generally detectable in upper respiratoy specimens during the acute phase of infection. The lowest concentration of SARS-CoV-2 viral copies this assay can detect is 131 copies/mL. A negative result does not preclude SARS-Cov-2 infection and should not be used as the sole basis for treatment or other patient management decisions. A negative result may occur with  improper specimen collection/handling, submission of specimen other than nasopharyngeal swab, presence of viral mutation(s) within the areas targeted by this assay, and inadequate  number of viral copies (<131 copies/mL). A negative result must be combined with clinical observations, patient history, and epidemiological information. The expected result is Negative.  Fact Sheet for Patients:  PinkCheek.be  Fact Sheet for Healthcare Providers:  GravelBags.it  This test is no t yet approved or cleared by the [place] FDA and  has been authorized for detection and/or diagnosis of SARS-CoV-2 by FDA under an Emergency Use Authorization (EUA). This EUA will remain  in effect (meaning this test can be used) for the duration of the COVID-19 declaration under Section 564(b)(1) of the Act, 21 U.S.C. section 360bbb-3(b)(1), unless the authorization is terminated or revoked sooner.     Influenza A by PCR NEGATIVE NEGATIVE Final   Influenza B by PCR NEGATIVE NEGATIVE Final    Comment: (NOTE) The Xpert Xpress SARS-CoV-2/FLU/RSV assay is intended as an aid in  the diagnosis of influenza from Nasopharyngeal swab specimens and  should not be used as a sole basis for treatment. Nasal washings and  aspirates are  unacceptable for Xpert Xpress SARS-CoV-2/FLU/RSV  testing.  Fact Sheet for Patients: PinkCheek.be  Fact Sheet for Healthcare Providers: GravelBags.it  This test is not yet approved or cleared by the [place] FDA and  has been authorized for detection and/or diagnosis of SARS-CoV-2 by  FDA under an Emergency Use Authorization (EUA). This EUA will remain  in effect (meaning this test can be used) for the duration of the  Covid-19 declaration under Section 564(b)(1) of the Act, 21  U.S.C. section 360bbb-3(b)(1), unless the authorization is  terminated or revoked. Performed at [facility], [place] [address] [place], [place] [address]      Studies: NM Myocar Multi W/Spect Tamela Oddi Motion / EF  Result Date:  05/26/2020 CLINICAL DATA:  Chest pain EXAM: MYOCARDIAL IMAGING WITH SPECT (REST AND PHARMACOLOGIC-STRESS) GATED LEFT VENTRICULAR WALL MOTION STUDY LEFT VENTRICULAR EJECTION FRACTION TECHNIQUE: Standard myocardial SPECT imaging was performed after resting intravenous injection of 10 mCi Tc-63m tetrofosmin. Subsequently, intravenous infusion of Lexiscan was performed under the supervision of the Cardiology staff. At peak effect of the drug, 30 mCi Tc-30m tetrofosmin was injected intravenously and standard myocardial SPECT imaging was performed. Quantitative gated imaging was also performed to evaluate left ventricular wall motion, and estimate left ventricular ejection fraction. COMPARISON:  None. FINDINGS: Perfusion: No significant decreased activity in the left ventricle on stress imaging to suggest reversible ischemia or infarction. Wall Motion: Mild global hypokinesis Left Ventricular Ejection Fraction: 43 % End diastolic volume 595 ml End systolic volume 65 ml IMPRESSION: 1. No significant reversible ischemia or infarction. 2. Mild global hypokinesis. 3. Left ventricular ejection fraction 43% 4. Non invasive risk stratification*: Intermediate *2012 Appropriate Use Criteria for Coronary Revascularization Focused Update: J Am Coll Cardiol. 6387;56(4):332-951. http://content.[email or web address]?articleid=[email or web address] Electronically Signed   By: Jerilynn Mages.  Shick M.D.   On: 05/26/2020 14:44      Flora Lipps, MD  [facility] 05/27/2020

## 2020-05-27 NOTE — Interval H&P Note (Signed)
History and Physical Interval Note:  05/27/2020 8:37 AM  Casey Acosta  has presented today for surgery, with the diagnosis of FIBROBLASTOMA OF AORTIC VALVE.  The various methods of treatment have been discussed with the patient and family. After consideration of risks, benefits and other options for treatment, the patient has consented to  Procedure(s): TRANSESOPHAGEAL ECHOCARDIOGRAM (TEE) (N/A) as a surgical intervention.  The patient's history has been reviewed, patient examined, no change in status, stable for surgery.  I have reviewed the patient's chart and labs.  Questions were answered to the patient's satisfaction.     Jenkins Rouge

## 2020-05-28 ENCOUNTER — Telehealth: Payer: Self-pay | Admitting: Family Medicine

## 2020-05-28 ENCOUNTER — Encounter (HOSPITAL_COMMUNITY): Payer: Self-pay | Admitting: Cardiovascular Disease

## 2020-05-28 LAB — GLUCOSE, CAPILLARY
Glucose-Capillary: 191 mg/dL — ABNORMAL HIGH (ref 70–99)
Glucose-Capillary: 224 mg/dL — ABNORMAL HIGH (ref 70–99)

## 2020-05-28 MED ORDER — IRBESARTAN 300 MG PO TABS
300.0000 mg | ORAL_TABLET | Freq: Every day | ORAL | 2 refills | Status: DC
Start: 2020-05-29 — End: 2020-08-06

## 2020-05-28 MED ORDER — AMLODIPINE BESYLATE 10 MG PO TABS
10.0000 mg | ORAL_TABLET | Freq: Every day | ORAL | 2 refills | Status: DC
Start: 2020-05-29 — End: 2020-07-12

## 2020-05-28 MED ORDER — CLONIDINE 0.1 MG/24HR TD PTWK: 0.1000 mg | MEDICATED_PATCH | TRANSDERMAL | 12 refills | Status: DC

## 2020-05-28 MED ORDER — CLONIDINE HCL 0.1 MG/24HR TD PTWK
0.1000 mg | MEDICATED_PATCH | TRANSDERMAL | Status: DC
  Administered 2020-05-28: 0.1 mg via TRANSDERMAL
  Filled 2020-05-28: qty 1

## 2020-05-28 MED ORDER — GLIMEPIRIDE 1 MG PO TABS: 2.0000 mg | ORAL_TABLET | Freq: Every day | ORAL | 2 refills | Status: DC

## 2020-05-28 MED ORDER — CARVEDILOL 25 MG PO TABS
25.0000 mg | ORAL_TABLET | Freq: Two times a day (BID) | ORAL | 2 refills | Status: DC
Start: 2020-05-28 — End: 2020-10-21

## 2020-05-28 MED ORDER — HYDRALAZINE HCL 100 MG PO TABS
100.0000 mg | ORAL_TABLET | Freq: Four times a day (QID) | ORAL | 2 refills | Status: DC
Start: 2020-05-28 — End: 2020-05-28

## 2020-05-28 MED ORDER — HYDRALAZINE HCL 100 MG PO TABS
100.0000 mg | ORAL_TABLET | Freq: Four times a day (QID) | ORAL | 2 refills | Status: DC
Start: 2020-05-28 — End: 2020-08-14

## 2020-05-28 NOTE — Progress Notes (Signed)
Progress Note  Patient Name: Casey Acosta Date of Encounter: 05/28/2020  [facility] Cardiologist: Kirk Ruths, MD   Subjective   Denies any chest pain or SOB.  Anxious to go home.  TEE showed nodular calcifications and no fibroelastoma  Inpatient Medications    Scheduled Meds: . amLODipine  10 mg Oral Daily  . aspirin  325 mg Oral Daily  . carvedilol  25 mg Oral BID WC  . chlorthalidone  25 mg Oral Daily  . enoxaparin (LOVENOX) injection  40 mg Subcutaneous Q24H  . hydrALAZINE  100 mg Oral Q6H  . insulin aspart  0-15 Units Subcutaneous TID AC & HS  . insulin glargine  10 Units Subcutaneous Daily  . irbesartan  300 mg Oral Daily  . rosuvastatin  40 mg Oral Daily   Continuous Infusions: . sodium chloride 250 mL (05/24/20 1337)   PRN Meds: sodium chloride, acetaminophen, fentaNYL (SUBLIMAZE) injection, hydrALAZINE, hydrOXYzine, ondansetron (ZOFRAN) IV, polyethylene glycol, traMADol   Vital Signs    Vitals:   05/28/20 0158 05/28/20 0533 05/28/20 0534 05/28/20 0848  BP: 135/74 (!) 147/84  (!) 151/82  Pulse:  84    Resp:  18    Temp:  98.4 F (36.9 C)    TempSrc:  Oral    SpO2:  98%    Weight:   90.4 kg   Height:        Intake/Output Summary (Last 24 hours) at 05/28/2020 0856 Last data filed at 05/27/2020 2000 Gross per 24 hour  Intake 860 ml  Output --  Net 860 ml   Last 3 Weights 05/28/2020 05/27/2020 05/26/2020  Weight (lbs) 199 lb 3.2 oz 199 lb 12.8 oz 199 lb 11.8 oz  Weight (kg) 90.357 kg 90.629 kg 90.6 kg      Telemetry    NSR - Personally Reviewed  ECG    No new EKG to review.  - Personally Reviewed  Physical Exam   VS:  BP (!) 151/82   Pulse 84   Temp 98.4 F (36.9 C) (Oral)   Resp 18   Ht 5\' 6"  (1.676 m)   Wt 90.4 kg   SpO2 98%   BMI 32.15 kg/m  , BMI Body mass index is 32.15 kg/m.  GEN: Well nourished, well developed in no acute distress HEENT: Normal NECK: No JVD; No carotid bruits LYMPHATICS: No  lymphadenopathy CARDIAC:RRR, no murmurs, rubs, gallops RESPIRATORY:  Clear to auscultation without rales, wheezing or rhonchi  ABDOMEN: Soft, non-tender, non-distended MUSCULOSKELETAL:  No edema; No deformity  SKIN: Warm and dry NEUROLOGIC:  Alert and oriented x 3 PSYCHIATRIC:  Normal affect    Labs    High Sensitivity Troponin:   Recent Labs  Lab 05/23/20 2356 05/24/20 0426 05/24/20 0948 05/26/20 1702 05/26/20 1753  TROPONINIHS 34* 32* 32* 13 16      Chemistry Recent Labs  Lab 05/24/20 0948 05/26/20 0928 05/27/20 1232  NA 135 132* 134*  K 3.5 4.2 3.9  CL 100 102 101  CO2 25 22 22   GLUCOSE 197* 230* 177*  BUN 19 21* 19  CREATININE 1.60* 1.69* 1.91*  CALCIUM 9.0 9.0 9.3  GFRNONAA 37* 35* 30*  GFRAA 43* 40* 35*  ANIONGAP 10 8 11      Hematology Recent Labs  Lab 05/23/20 2356 05/23/20 2356 05/24/20 0948 05/27/20 1232  WBC 12.4*  --  10.4 10.5  RBC 3.97   < > 3.87 4.20  4.17  HGB 10.6*  --  10.5* 11.0*  HCT  32.1*  --  31.3* 34.1*  MCV 80.9  --  80.9 81.2  MCH 26.7  --  27.1 26.2  MCHC 33.0  --  33.5 32.3  RDW 14.0  --  14.2 14.6  PLT 366  --  305 367   < > = values in this interval not displayed.    BNP Recent Labs  Lab 05/24/20 0000  BNP 166.1*     DDimer No results for input(s): DDIMER in the last 168 hours.   Radiology    NM Myocar Multi W/Spect W/Wall Motion / EF  Result Date: 05/26/2020 CLINICAL DATA:  Chest pain EXAM: MYOCARDIAL IMAGING WITH SPECT (REST AND PHARMACOLOGIC-STRESS) GATED LEFT VENTRICULAR WALL MOTION STUDY LEFT VENTRICULAR EJECTION FRACTION TECHNIQUE: Standard myocardial SPECT imaging was performed after resting intravenous injection of 10 mCi Tc-35m tetrofosmin. Subsequently, intravenous infusion of Lexiscan was performed under the supervision of the Cardiology staff. At peak effect of the drug, 30 mCi Tc-20m tetrofosmin was injected intravenously and standard myocardial SPECT imaging was performed. Quantitative gated imaging  was also performed to evaluate left ventricular wall motion, and estimate left ventricular ejection fraction. COMPARISON:  None. FINDINGS: Perfusion: No significant decreased activity in the left ventricle on stress imaging to suggest reversible ischemia or infarction. Wall Motion: Mild global hypokinesis Left Ventricular Ejection Fraction: 43 % End diastolic volume 427 ml End systolic volume 65 ml IMPRESSION: 1. No significant reversible ischemia or infarction. 2. Mild global hypokinesis. 3. Left ventricular ejection fraction 43% 4. Non invasive risk stratification*: Intermediate *2012 Appropriate Use Criteria for Coronary Revascularization Focused Update: J Am Coll Cardiol. 0623;76(2):831-517. http://content.[email or web address].aspx?articleid=[email or web address] Electronically Signed   By: Jerilynn Mages.  Shick M.D.   On: 05/26/2020 14:44   ECHO TEE  Result Date: 05/27/2020    TRANSESOPHOGEAL ECHO REPORT   Patient Name:   Casey Acosta Date of Exam: 05/27/2020 Medical Rec #:  [id number]        Height:       66.0 in Accession #:    [id number]       Weight:       199.8 lb Date of Birth:  [birth year]        BSA:          1.999 m Patient Age:    48 years         BP:           168/91 mmHg Patient Gender: F                HR:           97 bpm. Exam Location:  Inpatient Procedure: 3D Echo, Transesophageal Echo, Color Doppler and Cardiac Doppler Indications:     Papillary Fibroelastoma  History:         Patient has prior history of Echocardiogram examinations, most                  recent 05/24/2020. Cardiomyopathy, Signs/Symptoms:Dyspnea and                  Syncope; Risk Factors:Hypertension, Diabetes and Dyslipidemia.                  CKD. LVH.  Sonographer:     Tiffany Dance Referring Phys:  [id number] Tami Lin [facility] Diagnosing Phys: Jenkins Rouge MD PROCEDURE: The transesophogeal probe was passed without difficulty through the esophogus of the patient. Sedation performed by different physician. The patient was monitored while under  deep sedation. Anesthestetic sedation was provided intravenously by Anesthesiology:  527.02mg  of Propofol, 20mg  of Lidocaine. The patient's vital signs; including heart rate, blood pressure, and oxygen saturation; remained stable throughout the procedure. The patient developed no complications during the procedure. IMPRESSIONS  1. Left ventricular ejection fraction, by estimation, is 60 to 65%. The left ventricle has normal function. The left ventricle has no regional wall motion abnormalities.  2. Right ventricular systolic function is normal. The right ventricular size is normal.  3. No left atrial/left atrial appendage thrombus was detected.  4. The mitral valve is normal in structure. Trivial mitral valve regurgitation. No evidence of mitral stenosis.  5. Triduspid - nodular thickening of the edge of the left coronary cusp No independant motion not likely to be fibroelastoma as opposed to nodular calcification Should not be clinically significant can consider ASA Rx 81 mg in absence of TIA symptoms . The aortic valve is abnormal. Aortic valve regurgitation is trivial. No aortic stenosis is present.  6. The inferior vena cava is normal in size with greater than 50% respiratory variability, suggesting right atrial pressure of 3 mmHg. Conclusion(s)/Recommendation(s): Normal biventricular function without evidence of hemodynamically significant valvular heart disease. FINDINGS  Left Ventricle: Left ventricular ejection fraction, by estimation, is 60 to 65%. The left ventricle has normal function. The left ventricle has no regional wall motion abnormalities. The left ventricular internal cavity size was normal in size. There is  no left ventricular hypertrophy. Right Ventricle: The right ventricular size is normal. No increase in right ventricular wall thickness. Right ventricular systolic function is normal. Left Atrium: Left atrial size was normal in size. No left atrial/left atrial appendage thrombus was detected.  Right Atrium: Right atrial size was normal in size. Pericardium: There is no evidence of pericardial effusion. Mitral Valve: The mitral valve is normal in structure. Trivial mitral valve regurgitation. No evidence of mitral valve stenosis. Tricuspid Valve: The tricuspid valve is normal in structure. Tricuspid valve regurgitation is not demonstrated. No evidence of tricuspid stenosis. Aortic Valve: Triduspid - nodular thickening of the edge of the left coronary cusp No independant motion not likely to be fibroelastoma as opposed to nodular calcification Should not be clinically significant can consider ASA Rx 81 mg in absence of TIA symptoms. The aortic valve is abnormal. Aortic valve regurgitation is trivial. No aortic stenosis is present. Aortic valve mean gradient measures 4.0 mmHg. Aortic valve peak gradient measures 9.4 mmHg. Pulmonic Valve: The pulmonic valve was normal in structure. Pulmonic valve regurgitation is not visualized. No evidence of pulmonic stenosis. Aorta: The aortic root is normal in size and structure. Venous: The inferior vena cava is normal in size with greater than 50% respiratory variability, suggesting right atrial pressure of 3 mmHg. IAS/Shunts: The interatrial septum appears to be lipomatous. No atrial level shunt detected by color flow Doppler.  AORTIC VALVE AV Vmax:      153.00 cm/s AV Vmean:     96.800 cm/s AV VTI:       0.226 m AV Peak Grad: 9.4 mmHg AV Mean Grad: 4.0 mmHg Jenkins Rouge MD Electronically signed by Jenkins Rouge MD Signature Date/Time: 05/27/2020/10:18:53 AM    Final     Cardiac Studies   Echo 05/2020: 1. There is a small mass (0.6 cm x 0.6 cm) attached to the [place] or [place] of  the aortic valve (difficult to determine). This likely represents  calcified mass vs papillary fibroelastoma. There is no regurgitation or  significant destruction of the valve to  suggest this is vegetation. Clinical correlation is needed and  would  recommend TEE for better  characterization. The aortic valve is tricuspid.  Aortic valve regurgitation is not visualized. No aortic stenosis is  present.  2. Left ventricular ejection fraction, by estimation, is 60 to 65%. The  left ventricle has normal function. The left ventricle has no regional  wall motion abnormalities. There is moderate concentric left ventricular  hypertrophy. Left ventricular  diastolic parameters are consistent with Grade II diastolic dysfunction  (pseudonormalization). Elevated left atrial pressure.  3. Right ventricular systolic function is normal. The right ventricular  size is normal. Tricuspid regurgitation signal is inadequate for assessing  PA pressure.  4. The mitral valve is grossly normal. Trivial mitral valve  regurgitation. No evidence of mitral stenosis.  5. The inferior vena cava is normal in size with greater than 50%  respiratory variability, suggesting right atrial pressure of 3 mmHg.   TEE 05/27/2020 IMPRESSIONS   1. Left ventricular ejection fraction, by estimation, is 60 to 65%. The  left ventricle has normal function. The left ventricle has no regional  wall motion abnormalities.  2. Right ventricular systolic function is normal. The right ventricular  size is normal.  3. No left atrial/left atrial appendage thrombus was detected.  4. The mitral valve is normal in structure. Trivial mitral valve  regurgitation. No evidence of mitral stenosis.  5. Triduspid - nodular thickening of the edge of the left coronary cusp  No independant motion not likely to be fibroelastoma as opposed to nodular  calcification Should not be clinically significant can consider ASA Rx 81  mg in absence of TIA symptoms .  The aortic valve is abnormal. Aortic valve regurgitation is trivial. No  aortic stenosis is present.  6. The inferior vena cava is normal in size with greater than 50%  respiratory variability, suggesting right atrial pressure of 3 mmHg.   Patient Profile      49 y.o. female with hypertension, hyperlipidemia, CKD 3, none adherence, and diabetes admitted with hypertensive urgency and chest pain.  Assessment & Plan    # Chest pain -had CP during her nuclear stress test and later in the afternoon that was burning but no CP since then -minimal elevation with flat trend in hsTrop felt to be demand ischemia in the setting of hypertensive urgency -2D echo with normal LVF and EF 60-65% -nuclear stress test this admission with no ischemia -she has not had any further CP -needs aggressive BP control  # Elevated troponin -see #1 -Troponin minimally elevated to 34 and flat in the setting of hypertensive urgency.  Less likely that this represents ACS.  Carlton Adam Myoview with no ischemia (EF read as mildly reduced but EF on echo is normal)  # Hypertensive urgency -BP remains elevated but much improved from yesterday but SCr has increased to 1.91 -continue amlodipine 10mg  daily, Carvedilol 25mg  BID, Irbesartan 300mg  daily, Hydralazine 100mg  QID  -stop Chlorthalidone -She was taking PO clonidine sporadically, but given her labile hypertension it was discontinued for fear of rebound HTN when forgetting to take her pill -will start Clonidine patch 0.1mg  q weekly to see if we can get steady drug delivery and titrate as needed -will get outpt sleep study  # Medical non-adherence:  -Blood pressures continue to improve -Metoprolol was switched to carvedilol for improved rate control.   -Hydralazine was increased to 100 mg 4 times daily  -At home she was on Maxide but had difficulty getting this from the pharmacy so she was started  on chlorthalidone 25 mg daily  but now SCr has bumped -stop chlorthalidone -Continue irbesartan and amlodipine. -Renal artery Dopplers were negative 12/2019.  -Thyroid normal 10/2019.   -Consider checking for hyperaldosteronism, though this cannot be done while she is on her ARB. -Given that her blood pressure still not adequately  controlled we will not hold it to allow for testing now but this can be considered as an outpatient if her blood pressure is better controlled.    # Aortic valve mass:  Noted on transthoracic echo.  Very small (0.6 cm x 0.6 cm).   -TEE demonstrated nodular calcification and no fibroelastoma -recommended ASA 81mg  daily  [facility] will sign off.   Medication Recommendations:  Carvedilol 25mg  BID, amlodipine 10mg  daily, Irbesartan 300mg  daily, Hydralazine 100mg  QID and Clonidine patch 0.1mg  q weekly Other recommendations (labs, testing, etc):  BMET 1 week Follow up as an outpatient:  Advanced HTN clinic with Dr. Oval Linsey 10/6   I have spent a total of 35 minutes with patient reviewing 2D echo, TEE, nuclear stress test , telemetry, EKGs, labs and examining patient as well as establishing an assessment and plan that was discussed with the patient.  > 50% of time was spent in direct patient care.    For questions or updates, please contact [place] Please consult [email or web address] for contact info under        Signed, Fransico Him, MD  05/28/2020, 8:56 AM

## 2020-05-28 NOTE — Progress Notes (Signed)
Inpatient Diabetes Program Recommendations  AACE/ADA: New Consensus Statement on Inpatient Glycemic Control (2015)  Target Ranges:  Prepandial:   less than 140 mg/dL      Peak postprandial:   less than 180 mg/dL (1-2 hours)      Critically ill patients:  140 - 180 mg/dL   Lab Results  Component Value Date   GLUCAP 224 (H) 05/28/2020   HGBA1C 10.8 (H) 05/24/2020    Review of Glycemic Control Results for DNIYAH, GRANT (MRN [id number]) as of 05/28/2020 13:05  Ref. Range 05/27/2020 15:45 05/27/2020 21:21 05/28/2020 07:57 05/28/2020 11:41  Glucose-Capillary Latest Ref Range: 70 - 99 mg/dL 308 (H) 134 (H) 191 (H) 224 (H)   Diabetes history:  DM2 Outpatient Diabetes medications:  Ozempic 0.5 weekly (not taking) Metformin 500 mg daily Current orders for Inpatient glycemic control:  Lantus 10 units daily Novolog 0-15 units tid   Inpatient Diabetes Program Recommendations:     Consider increasing Lantus 12 units daily.   At discharge, consider resuming Ozempic 0.5 mg Qwk (as copay confirmed at $0), Amaryl 2 mg QD and following up with Dr Cruzita Lederer.   Thanks, Bronson Curb, MSN, RNC-OB Diabetes Coordinator [phone] (8a-5p)

## 2020-05-28 NOTE — Discharge Summary (Addendum)
Physician Discharge Summary  Casey Acosta MHD:[id number] DOB: 1970-10-27 DOA: 05/23/2020  PCP: Billie Ruddy, MD  Admit date: 05/23/2020 Discharge date: 05/28/2020  Admitted From: Home  Discharge disposition: home  Recommendations for Outpatient Follow-Up:   . Follow up with your primary care provider in one week.  . Check CBC, BMP, magnesium in the next visit . Follow-up with hypertension clinic as has been scheduled  Discharge Diagnosis:   Principal Problem:   Chest pain Active Problems:   Uncontrolled type 2 diabetes mellitus with diabetic nephropathy, without long-term current use of insulin (HCC)   Chronic kidney disease, stage 3b   Elevated troponin   Hypertensive urgency   Mixed diabetic hyperlipidemia associated with type 2 diabetes mellitus ([place])  Discharge Condition: Improved.  Diet recommendation: Low sodium, heart healthy.  Carbohydrate-modified.   Wound care: None.  Code status: Full.  History of Present Illness:   49 year old female with prior history of hypertension, hyperlipidemia, DM type 2 presented to hospital with complaint of chest discomfort and hypertensive urgency.  Her troponins were initially elevated and EKG showed mild LVH.  Patient continued to have intermittent chest pain so cardiology was consulted.  Cardiology recommended a stress test.  Patient was then admitted to the hospital.  Hospital Course:   Following conditions were addressed during hospitalization as listed below,  Chest pain with mildly elevated troponin, dsypnea.  Likely secondary to accelerated hypertension. Patient had multiple cardiac risk factors including hypertension, uncontrolled diabetes, hyperlipidemia and with her symptoms cardiology was consulted.  2D echocardiogram was performed which showed Left ventricular  ejection fraction of 60 to 65%.  Nuclear stress test was performed as well without no evidence of ischemia.  2D echo showed possibility of calcified mass  versus papillary fibroblastoma and mass at aortic valve so patient underwent TEE on 05/27/2020 with findings of nodular calcification.  Troponins were flat.  Patient has ruled out for acute coronary syndrome.  Hypertensive urgency.  Continue Coreg, hydralazine, amlodipine, clonidine patch and Avapro discharge as per cardiology recommendation.  Patient is supposed to follow-up with hypertension clinic as outpatient.  Importance of dietary modification lifestyle modification , medication compliance was discussed  Diabetes mellitus type 2., Hemoglobin A1c of 10.8.    Resume home regimen on discharge.  Hyperlipidemia.  Continue Crestor.  Medication noncompliance.  Difficulty obtaining medication.  Hydralazine has been sent to the transitional care clinic.  CKD stage IIIb.    Will need outpatient close monitoring.  Anemia of chronic kidney disease.  Baseline hemoglobin of 10.5.  Closely monitor.  Iron profile has been sent.  Mild hyponatremia.    Sodium 1324  Disposition.  At this time, patient is stable for disposition home.  Seen by cardiology prior to discharge.  Medical Consultants:    Cardiology  Procedures:    Myocardial perfusion scan TEE  Subjective:   Today, patient was seen and examined at bedside.  Patient denies any chest pain, shortness of breath, fever or chills.  Discharge Exam:   Vitals:   05/28/20 0533 05/28/20 0848  BP: (!) 147/84 (!) 151/82  Pulse: 84   Resp: 18   Temp: 98.4 F (36.9 C)   SpO2: 98%    Vitals:   05/28/20 0158 05/28/20 0533 05/28/20 0534 05/28/20 0848  BP: 135/74 (!) 147/84  (!) 151/82  Pulse:  84    Resp:  18    Temp:  98.4 F (36.9 C)    TempSrc:  Oral    SpO2:  98%  Weight:   90.4 kg   Height:       General: Alert awake, not in obvious distress, obese HENT: pupils equally reacting to light,  No scleral pallor or icterus noted. Oral mucosa is moist.  Chest:  Clear breath sounds.  Diminished breath sounds bilaterally. No  crackles or wheezes.  CVS: S1 &S2 heard. No murmur.  Regular rate and rhythm. Abdomen: Soft, nontender, nondistended.  Bowel sounds are heard.   Extremities: No cyanosis, clubbing or edema.  Peripheral pulses are palpable. Psych: Alert, awake and oriented, normal mood CNS:  No cranial nerve deficits.  Power equal in all extremities.   Skin: Warm and dry.  No rashes noted.  The results of significant diagnostics from this hospitalization (including imaging, microbiology, ancillary and laboratory) are listed below for reference.     Diagnostic Studies:   NM Myocar Multi W/Spect W/Wall Motion / EF  Result Date: 05/26/2020 CLINICAL DATA:  Chest pain EXAM: MYOCARDIAL IMAGING WITH SPECT (REST AND PHARMACOLOGIC-STRESS) GATED LEFT VENTRICULAR WALL MOTION STUDY LEFT VENTRICULAR EJECTION FRACTION TECHNIQUE: Standard myocardial SPECT imaging was performed after resting intravenous injection of 10 mCi Tc-34m tetrofosmin. Subsequently, intravenous infusion of Lexiscan was performed under the supervision of the Cardiology staff. At peak effect of the drug, 30 mCi Tc-59m tetrofosmin was injected intravenously and standard myocardial SPECT imaging was performed. Quantitative gated imaging was also performed to evaluate left ventricular wall motion, and estimate left ventricular ejection fraction. COMPARISON:  None. FINDINGS: Perfusion: No significant decreased activity in the left ventricle on stress imaging to suggest reversible ischemia or infarction. Wall Motion: Mild global hypokinesis Left Ventricular Ejection Fraction: 43 % End diastolic volume 885 ml End systolic volume 65 ml IMPRESSION: 1. No significant reversible ischemia or infarction. 2. Mild global hypokinesis. 3. Left ventricular ejection fraction 43% 4. Non invasive risk stratification*: Intermediate *2012 Appropriate Use Criteria for Coronary Revascularization Focused Update: J Am Coll Cardiol. 0277;41(2):878-676.  http://content.[email or web address].aspx?articleid=[email or web address] Electronically Signed   By: Jerilynn Mages.  Shick M.D.   On: 05/26/2020 14:44   ECHO TEE  Result Date: 05/27/2020    TRANSESOPHOGEAL ECHO REPORT   Patient Name:   Casey Acosta Date of Exam: 05/27/2020 Medical Rec #:  [id number]        Height:       66.0 in Accession #:    [id number]       Weight:       199.8 lb Date of Birth:  May 25, 1971        BSA:          1.999 m Patient Age:    51 years         BP:           168/91 mmHg Patient Gender: F                HR:           97 bpm. Exam Location:  Inpatient Procedure: 3D Echo, Transesophageal Echo, Color Doppler and Cardiac Doppler Indications:     Papillary Fibroelastoma  History:         Patient has prior history of Echocardiogram examinations, most                  recent 05/24/2020. Cardiomyopathy, Signs/Symptoms:Dyspnea and                  Syncope; Risk Factors:Hypertension, Diabetes and Dyslipidemia.                  CKD. LVH.  Sonographer:     Tiffany Dance Referring Phys:  [id number] Tami Lin [facility] Diagnosing Phys: Jenkins Rouge MD PROCEDURE: The transesophogeal probe was passed without difficulty through the esophogus of the patient. Sedation performed by different physician. The patient was monitored while under deep sedation. Anesthestetic sedation was provided intravenously by Anesthesiology: 527.02mg  of Propofol, 20mg  of Lidocaine. The patient's vital signs; including heart rate, blood pressure, and oxygen saturation; remained stable throughout the procedure. The patient developed no complications during the procedure. IMPRESSIONS  1. Left ventricular ejection fraction, by estimation, is 60 to 65%. The left ventricle has normal function. The left ventricle has no regional wall motion abnormalities.  2. Right ventricular systolic function is normal. The right ventricular size is normal.  3. No left atrial/left atrial appendage thrombus was detected.  4. The mitral valve is normal in structure. Trivial  mitral valve regurgitation. No evidence of mitral stenosis.  5. Triduspid - nodular thickening of the edge of the left coronary cusp No independant motion not likely to be fibroelastoma as opposed to nodular calcification Should not be clinically significant can consider ASA Rx 81 mg in absence of TIA symptoms . The aortic valve is abnormal. Aortic valve regurgitation is trivial. No aortic stenosis is present.  6. The inferior vena cava is normal in size with greater than 50% respiratory variability, suggesting right atrial pressure of 3 mmHg. Conclusion(s)/Recommendation(s): Normal biventricular function without evidence of hemodynamically significant valvular heart disease. FINDINGS  Left Ventricle: Left ventricular ejection fraction, by estimation, is 60 to 65%. The left ventricle has normal function. The left ventricle has no regional wall motion abnormalities. The left ventricular internal cavity size was normal in size. There is  no left ventricular hypertrophy. Right Ventricle: The right ventricular size is normal. No increase in right ventricular wall thickness. Right ventricular systolic function is normal. Left Atrium: Left atrial size was normal in size. No left atrial/left atrial appendage thrombus was detected. Right Atrium: Right atrial size was normal in size. Pericardium: There is no evidence of pericardial effusion. Mitral Valve: The mitral valve is normal in structure. Trivial mitral valve regurgitation. No evidence of mitral valve stenosis. Tricuspid Valve: The tricuspid valve is normal in structure. Tricuspid valve regurgitation is not demonstrated. No evidence of tricuspid stenosis. Aortic Valve: Triduspid - nodular thickening of the edge of the left coronary cusp No independant motion not likely to be fibroelastoma as opposed to nodular calcification Should not be clinically significant can consider ASA Rx 81 mg in absence of TIA symptoms. The aortic valve is abnormal. Aortic valve  regurgitation is trivial. No aortic stenosis is present. Aortic valve mean gradient measures 4.0 mmHg. Aortic valve peak gradient measures 9.4 mmHg. Pulmonic Valve: The pulmonic valve was normal in structure. Pulmonic valve regurgitation is not visualized. No evidence of pulmonic stenosis. Aorta: The aortic root is normal in size and structure. Venous: The inferior vena cava is normal in size with greater than 50% respiratory variability, suggesting right atrial pressure of 3 mmHg. IAS/Shunts: The interatrial septum appears to be lipomatous. No atrial level shunt detected by color flow Doppler.  AORTIC VALVE AV Vmax:      153.00 cm/s AV Vmean:     96.800 cm/s AV VTI:       0.226 m AV Peak Grad: 9.4 mmHg AV Mean Grad: 4.0 mmHg Jenkins Rouge MD Electronically signed by Jenkins Rouge MD Signature Date/Time: 05/27/2020/10:18:53 AM    Final      Labs:   Basic Metabolic Panel: Recent  Labs  Lab 05/23/20 2356 05/23/20 2356 05/24/20 0948 05/24/20 0948 05/26/20 0928 05/27/20 1232  NA 134*  --  135  --  132* 134*  K 3.8   < > 3.5   < > 4.2 3.9  CL 99  --  100  --  102 101  CO2 25  --  25  --  22 22  GLUCOSE 262*  --  197*  --  230* 177*  BUN 23*  --  19  --  21* 19  CREATININE 1.78*  --  1.60*  --  1.69* 1.91*  CALCIUM 9.4  --  9.0  --  9.0 9.3   < > = values in this interval not displayed.   GFR Estimated Creatinine Clearance: 39.9 mL/min (A) (by C-G formula based on SCr of 1.91 mg/dL (H)). Liver Function Tests: No results for input(s): AST, ALT, ALKPHOS, BILITOT, PROT, ALBUMIN in the last 168 hours. No results for input(s): LIPASE, AMYLASE in the last 168 hours. No results for input(s): AMMONIA in the last 168 hours. Coagulation profile Recent Labs  Lab 05/27/20 1232  INR 0.9    CBC: Recent Labs  Lab 05/23/20 2356 05/24/20 0948 05/27/20 1232  WBC 12.4* 10.4 10.5  HGB 10.6* 10.5* 11.0*  HCT 32.1* 31.3* 34.1*  MCV 80.9 80.9 81.2  PLT 366 305 367   Cardiac Enzymes: No results for  input(s): CKTOTAL, CKMB, CKMBINDEX, TROPONINI in the last 168 hours. BNP: Invalid input(s): POCBNP CBG: Recent Labs  Lab 05/27/20 1058 05/27/20 1545 05/27/20 2121 05/28/20 0757 05/28/20 1141  GLUCAP 167* 308* 134* 191* 224*   D-Dimer No results for input(s): DDIMER in the last 72 hours. Hgb A1c No results for input(s): HGBA1C in the last 72 hours. Lipid Profile No results for input(s): CHOL, HDL, LDLCALC, TRIG, CHOLHDL, LDLDIRECT in the last 72 hours. Thyroid function studies Recent Labs    05/27/20 1232  TSH 1.089  1.148   Anemia work up Recent Labs    05/27/20 1232  VITAMINB12 396  FOLATE 14.7  FERRITIN 193  TIBC 298  IRON 49  RETICCTPCT 1.6   Microbiology Recent Results (from the past 240 hour(s))  Respiratory Panel by RT PCR (Flu A&B, Covid) - Nasopharyngeal Swab     Status: None   Collection Time: 05/24/20  2:48 AM   Specimen: Nasopharyngeal Swab  Result Value Ref Range Status   SARS Coronavirus 2 by RT PCR NEGATIVE NEGATIVE Final    Comment: (NOTE) SARS-CoV-2 target nucleic acids are NOT DETECTED.  The SARS-CoV-2 RNA is generally detectable in upper respiratoy specimens during the acute phase of infection. The lowest concentration of SARS-CoV-2 viral copies this assay can detect is 131 copies/mL. A negative result does not preclude SARS-Cov-2 infection and should not be used as the sole basis for treatment or other patient management decisions. A negative result may occur with  improper specimen collection/handling, submission of specimen other than nasopharyngeal swab, presence of viral mutation(s) within the areas targeted by this assay, and inadequate number of viral copies (<131 copies/mL). A negative result must be combined with clinical observations, patient history, and epidemiological information. The expected result is Negative.  Fact Sheet for Patients:  https://www.moore.com/  Fact Sheet for Healthcare Providers:    https://www.young.biz/  This test is no t yet approved or cleared by the [place] FDA and  has been authorized for detection and/or diagnosis of SARS-CoV-2 by FDA under an Emergency Use Authorization (EUA). This EUA will remain  in  effect (meaning this test can be used) for the duration of the COVID-19 declaration under Section 564(b)(1) of the Act, 21 U.S.C. section 360bbb-3(b)(1), unless the authorization is terminated or revoked sooner.     Influenza A by PCR NEGATIVE NEGATIVE Final   Influenza B by PCR NEGATIVE NEGATIVE Final    Comment: (NOTE) The Xpert Xpress SARS-CoV-2/FLU/RSV assay is intended as an aid in  the diagnosis of influenza from Nasopharyngeal swab specimens and  should not be used as a sole basis for treatment. Nasal washings and  aspirates are unacceptable for Xpert Xpress SARS-CoV-2/FLU/RSV  testing.  Fact Sheet for Patients: PinkCheek.be  Fact Sheet for Healthcare Providers: GravelBags.it  This test is not yet approved or cleared by the [place] FDA and  has been authorized for detection and/or diagnosis of SARS-CoV-2 by  FDA under an Emergency Use Authorization (EUA). This EUA will remain  in effect (meaning this test can be used) for the duration of the  Covid-19 declaration under Section 564(b)(1) of the Act, 21  U.S.C. section 360bbb-3(b)(1), unless the authorization is  terminated or revoked. Performed at [facility], [place] [address] [place], [place] [address]      Discharge Instructions:   Discharge Instructions     Diet - low sodium heart healthy   Complete by: As directed    Diabetic diet   Discharge instructions   Complete by: As directed    Folllow up with your primary care physician in one week .Medications have changed please take a note. Follow up with hypertension clinic with Dr Oval Linsey on 06/05/20   Increase activity slowly    Complete by: As directed       Allergies as of 05/28/2020       Reactions   Dilaudid [hydromorphone] Nausea And Vomiting   Prozac [fluoxetine Hcl]    hallucinations        Medication List     STOP taking these medications    amLODipine-olmesartan 10-40 MG tablet Commonly known as: AZOR   cloNIDine 0.1 MG tablet Commonly known as: CATAPRES   metoprolol succinate 100 MG 24 hr tablet Commonly known as: TOPROL-XL   tinidazole 500 MG tablet Commonly known as: TINDAMAX   triamterene-hydrochlorothiazide 37.5-25 MG tablet Commonly known as: MAXZIDE-25       TAKE these medications    acetaminophen 500 MG tablet Commonly known as: TYLENOL Take 1,000 mg by mouth every 6 (six) hours as needed for mild pain, moderate pain, fever or headache.   amLODipine 10 MG tablet Commonly known as: NORVASC Take 1 tablet (10 mg total) by mouth daily. Start taking on: May 29, 2020   aspirin EC 81 MG tablet Take 81 mg by mouth daily.   blood glucose meter kit and supplies Kit Dispense based on patient and insurance preference. Use up to four times daily as directed.   carvedilol 25 MG tablet Commonly known as: COREG Take 1 tablet (25 mg total) by mouth 2 (two) times daily with a meal.   cloNIDine 0.1 mg/24hr patch Commonly known as: CATAPRES - Dosed in mg/24 hr Place 1 patch (0.1 mg total) onto the skin once a week.   hydrALAZINE 100 MG tablet Commonly known as: APRESOLINE Take 1 tablet (100 mg total) by mouth 4 (four) times daily.   hydrOXYzine 50 MG tablet Commonly known as: ATARAX/VISTARIL Take 1 tablet (50 mg total) by mouth 3 (three) times daily as needed. What changed: reasons to take this   irbesartan 300 MG  tablet Commonly known as: AVAPRO Take 1 tablet (300 mg total) by mouth daily. Start taking on: May 29, 2020   metFORMIN 500 MG tablet Commonly known as: GLUCOPHAGE Take 1 tablet (500 mg total) by mouth daily with breakfast.   Ozempic (0.25 or  0.5 MG/DOSE) 2 MG/1.5ML Sopn Generic drug: Semaglutide(0.25 or 0.5MG /DOS) Inject 0.5 mg into the skin once a week.   rosuvastatin 10 MG tablet Commonly known as: Crestor Take 1 tablet (10 mg total) by mouth daily.        Follow-up Information     Skeet Latch, MD Follow up.   Specialty: Cardiology Why: Follow-up visit for your high blood pressure scheduled for 06/05/2020 at 3pm. Please arrive 15 minutes early for check-in. If this date/time does not work for you, please call our office to reschedule. Contact information: [address] [place] [address] [phone]         Billie Ruddy, MD. Schedule an appointment as soon as possible for a visit in 1 week(s).   Specialty: Family Medicine Why: regular followup Contact information: [place] [address] [phone]         Lelon Perla, MD .   Specialty: Cardiology Contact information: [address] [place] [address] [phone]                  Time coordinating discharge: 39 minutes  Signed:  Shenea Giacobbe  [facility] 05/28/2020, 11:52 AM

## 2020-05-28 NOTE — Telephone Encounter (Signed)
Transition Care Management Follow-up Telephone Call  Date of discharge and from where: [facility]/28/[facility]   How have you been since you were released from the hospital? Patient states that she is feeling better. She is still short of breath on exertion.  Any questions or concerns? No  Items Reviewed:  Did the pt receive and understand the discharge instructions provided? Yes   Medications obtained and verified? Yes   Any new allergies since your discharge? No   Dietary orders reviewed? Yes  Do you have support at home? Yes   Functional Questionnaire: (I = Independent and D = Dependent) ADLs: I  Bathing/Dressing- I  Meal Prep- I  Eating- I  Maintaining continence- I  Transferring/Ambulation- I  Managing Meds- I  Follow up appointments reviewed:   PCP Hospital f/u appt confirmed? Yes  Scheduled to see Dr. Volanda Napoleon  on 06/03/2020 @ 10:30 am.  [facility] f/u appt confirmed? Yes  Scheduled to see Dr. Oval Linsey on 06/05/2020 @ 3:00 PM.  Are transportation arrangements needed? No   If their condition worsens, is the pt aware to call PCP or go to the Emergency Dept.? Yes  Was the patient provided with contact information for the PCP's office or ED? Yes  Was to pt encouraged to call back with questions or concerns? Yes

## 2020-05-28 NOTE — TOC Benefit Eligibility Note (Signed)
Transition of Care [facility]) Benefit Eligibility Note    Patient Details  Name: Casey Acosta MRN: [id number] Date of Birth: 1971/01/09   Medication/Dose: OZEMPIC 0.5 MG  INJECTION ONCE WEEK  Covered?: Yes     Prescription Coverage [name]  Spoke with Person/Company/Phone Number:: [name]  @  [employer] QP # [phone]  Co-Pay: [name]  Prior Approval: No          Memory Argue Phone Number: 05/28/2020, 12:44 PM

## 2020-05-28 NOTE — Anesthesia Postprocedure Evaluation (Signed)
Anesthesia Post Note  Patient: Casey Acosta  Procedure(s) Performed: TRANSESOPHAGEAL ECHOCARDIOGRAM (TEE) (N/A )     Patient location during evaluation: Endoscopy Anesthesia Type: MAC Level of consciousness: awake and alert Pain management: pain level controlled Vital Signs Assessment: post-procedure vital signs reviewed and stable Respiratory status: spontaneous breathing, nonlabored ventilation, respiratory function stable and patient connected to nasal cannula oxygen Cardiovascular status: stable and blood pressure returned to baseline Postop Assessment: no apparent nausea or vomiting Anesthetic complications: no   No complications documented.  Last Vitals:  Vitals:   05/28/20 0848 05/28/20 1228  BP: (!) 151/82 (!) 150/95  Pulse:    Resp:    Temp:    SpO2:      Last Pain:  Vitals:   05/28/20 0848  TempSrc:   PainSc: 0-No pain                 Anjolina Byrer

## 2020-06-03 ENCOUNTER — Inpatient Hospital Stay: Payer: 59 | Admitting: Family Medicine

## 2020-06-05 ENCOUNTER — Ambulatory Visit: Payer: 59 | Admitting: Cardiovascular Disease

## 2020-06-06 ENCOUNTER — Ambulatory Visit: Payer: 59 | Admitting: Cardiovascular Disease

## 2020-06-27 ENCOUNTER — Encounter: Payer: Self-pay | Admitting: Cardiovascular Disease

## 2020-06-27 ENCOUNTER — Ambulatory Visit (INDEPENDENT_AMBULATORY_CARE_PROVIDER_SITE_OTHER): Payer: 59 | Admitting: Cardiovascular Disease

## 2020-06-27 VITALS — BP 158/88 | HR 89 | Ht 66.0 in | Wt 206.0 lb

## 2020-06-27 DIAGNOSIS — R0681 Apnea, not elsewhere classified: Secondary | ICD-10-CM | POA: Diagnosis not present

## 2020-06-27 DIAGNOSIS — Z5181 Encounter for therapeutic drug level monitoring: Secondary | ICD-10-CM

## 2020-06-27 DIAGNOSIS — R0683 Snoring: Secondary | ICD-10-CM

## 2020-06-27 DIAGNOSIS — I10 Essential (primary) hypertension: Secondary | ICD-10-CM

## 2020-06-27 DIAGNOSIS — E669 Obesity, unspecified: Secondary | ICD-10-CM

## 2020-06-27 DIAGNOSIS — R0600 Dyspnea, unspecified: Secondary | ICD-10-CM

## 2020-06-27 DIAGNOSIS — N1832 Chronic kidney disease, stage 3b: Secondary | ICD-10-CM

## 2020-06-27 MED ORDER — SPIRONOLACTONE 25 MG PO TABS: ORAL_TABLET | ORAL | 1 refills | Status: DC

## 2020-06-27 MED ORDER — CLONIDINE 0.1 MG/24HR TD PTWK
0.1000 mg | MEDICATED_PATCH | TRANSDERMAL | 12 refills | Status: DC
Start: 2020-06-27 — End: 2021-09-17

## 2020-06-27 NOTE — Progress Notes (Signed)
Hypertension Clinic Initial Assessment:    Date:  06/27/2020   ID:  Casey Acosta, DOB November 04, 1970, MRN [id number]  PCP:  Billie Ruddy, MD  Cardiologist:  Kirk Ruths, MD  Nephrologist:  Referring MD: Billie Ruddy, MD   CC: Hypertension  History of Present Illness:    Casey Acosta is a 49 y.o. female with a hx of resistant hypertension, hyperlipidemia, DM, CKD 3 here to establish care in the hypertension clinic.  Casey Acosta was seen in the ED 05/2020 with chest pain associated with hypertensive urgency.  High-sensitivity troponin was minimally elevated.  Echo admission revealed LVEF 60 to 65% with moderate LVH and grade 2 diastolic dysfunction.  There was a small (0.6 cm x 0.6 cm) mass on the aortic valve leaflet.  She underwent TEE and the area was found to be nodular thickening and not a fibroblastoma.  She had a nuclear stress test on admission that was negative for ischemia.  It was noted that she was taking clonidine sporadically so this was switched to a patch.  Chlorthalidone was discontinued due to renal dysfunction.  Metoprolol was switched to carvedilol and she was continued on amlodipine, irbesartan, and hydralazine with improvement in her blood pressure prior to discharge.  She had an episode of chest pain last night when she was sleeping. The episodes have been much less frequent since she was discharged from the hospital.  She notes that she does snore and she does not feel rested when she wakes in the morning.  Her husband sometimes has to not her because she does not seem to be breathing well.  Since her diuretic was stopped in the hospital, her ankles swell by the end of the day after walking at work.  She gets a lot of exercise at work.  She has to walk all day at work.  She is tired when she gets home.  She has trouble with the mask but otherwise feels well.  Unfortunately insurance would not cover her clonidine patch.  She has been taking oral clonidine twice a  day at 5 PM and 11 8 PM.  If she takes in the morning she gets drowsy.  At home her blood pressure is been around the 150s over 90s to 100s.  She wonders if her wrist cuff is accurate.  She has been trying to work on her diet and is limiting salt.  She uses Mrs. Dash.  She is also trying to drink more water and does not have any caffeine.  She no longer uses Afrin.  Previous antihypertensives: Carvedilol Clonidine oral Chlorthalidone    Past Medical History:  Diagnosis Date  . Anemia   . Anxiety   . Anxiety and depression   . Chronic kidney disease   . Colon polyp   . Diabetes mellitus without complication ([place])   . Dysuria   . High risk sexual behavior   . History of syncope   . Hyperlipidemia   . Hypertension   . Internal hemorrhoids   . LVH (left ventricular hypertrophy)   . Malaise and fatigue   . Obesity (BMI 30-39.9)   . Ovarian mass   . Panic disorder   . PCOS (polycystic ovarian syndrome)   . Rectal bleeding   . Shortness of breath   . Surgical menopause   . Vaginal trichomoniasis     Past Surgical History:  Procedure Laterality Date  . ABDOMINAL HYSTERECTOMY     partial  . APPENDECTOMY    .  CHOLECYSTECTOMY    . TEE WITHOUT CARDIOVERSION N/A 05/27/2020   Procedure: TRANSESOPHAGEAL ECHOCARDIOGRAM (TEE);  Surgeon: Josue Hector, MD;  Location: [facility] ENDOSCOPY;  Service: Cardiovascular;  Laterality: N/A;  . TUBAL LIGATION      Current Medications: Current Meds  Medication Sig  . acetaminophen (TYLENOL) 500 MG tablet Take 1,000 mg by mouth every 6 (six) hours as needed for mild pain, moderate pain, fever or headache.  Marland Kitchen amLODipine (NORVASC) 10 MG tablet Take 1 tablet (10 mg total) by mouth daily.  Marland Kitchen aspirin EC 81 MG tablet Take 81 mg by mouth daily.  . blood glucose meter kit and supplies KIT Dispense based on patient and insurance preference. Use up to four times daily as directed.  . carvedilol (COREG) 25 MG tablet Take 1 tablet (25 mg total) by mouth 2 (two)  times daily with a meal.  . cloNIDine (CATAPRES - DOSED IN MG/24 HR) 0.1 mg/24hr patch Place 1 patch (0.1 mg total) onto the skin once a week.  Marland Kitchen glimepiride (AMARYL) 1 MG tablet Take 2 tablets (2 mg total) by mouth daily with breakfast.  . hydrALAZINE (APRESOLINE) 100 MG tablet Take 1 tablet (100 mg total) by mouth 4 (four) times daily.  . hydrOXYzine (ATARAX/VISTARIL) 50 MG tablet Take 1 tablet (50 mg total) by mouth 3 (three) times daily as needed. (Patient taking differently: Take 50 mg by mouth 3 (three) times daily as needed for anxiety (sleep). )  . irbesartan (AVAPRO) 300 MG tablet Take 1 tablet (300 mg total) by mouth daily.  . metFORMIN (GLUCOPHAGE) 500 MG tablet Take 1 tablet (500 mg total) by mouth daily with breakfast.  . rosuvastatin (CRESTOR) 10 MG tablet Take 1 tablet (10 mg total) by mouth daily.  . Semaglutide,0.25 or 0.5MG /DOS, (OZEMPIC, 0.25 OR 0.5 MG/DOSE,) 2 MG/1.5ML SOPN Inject 0.5 mg into the skin once a week.  . [DISCONTINUED] cloNIDine (CATAPRES - DOSED IN MG/24 HR) 0.1 mg/24hr patch Place 1 patch (0.1 mg total) onto the skin once a week.     Allergies:   Dilaudid [hydromorphone] and Prozac [fluoxetine hcl]   Social History   Socioeconomic History  . Marital status: Significant Other    Spouse name: Not on file  . Number of children: Not on file  . Years of education: Not on file  . Highest education level: Not on file  Occupational History  . Not on file  Tobacco Use  . Smoking status: Never Smoker  . Smokeless tobacco: Never Used  Vaping Use  . Vaping Use: Never used  Substance and Sexual Activity  . Alcohol use: Yes    Comment: Occas. wine  . Drug use: No  . Sexual activity: Yes    Birth control/protection: Surgical    Comment: HYST.-1st intercourse 49 yo-More than 5 partners  Other Topics Concern  . Not on file  Social History Narrative  . Not on file   Social Determinants of Health   Financial Resource Strain:   . Difficulty of Paying Living  Expenses: Not on file  Food Insecurity:   . Worried About Charity fundraiser in the Last Year: Not on file  . Ran Out of Food in the Last Year: Not on file  Transportation Needs:   . Lack of Transportation (Medical): Not on file  . Lack of Transportation (Non-Medical): Not on file  Physical Activity:   . Days of Exercise per Week: Not on file  . Minutes of Exercise per Session: Not on file  Stress:   . Feeling of Stress : Not on file  Social Connections:   . Frequency of Communication with Friends and Family: Not on file  . Frequency of Social Gatherings with Friends and Family: Not on file  . Attends Religious Services: Not on file  . Active Member of Clubs or Organizations: Not on file  . Attends Archivist Meetings: Not on file  . Marital Status: Not on file     Family History: The patient's family history includes ALS in her father; Anxiety disorder in her mother; Crohn's disease in her brother and sister; Depression in her mother; Diabetes in her mother and sister; Hyperlipidemia in her mother; Hypertension in her mother and sister; Thyroid disease in her mother.  ROS:   Please see the history of present illness.    All other systems reviewed and are negative.  EKGs/Labs/Other Studies Reviewed:    EKG:  EKG is not ordered today.   Recent Labs: 05/24/2020: B Natriuretic Peptide 166.1 05/27/2020: BUN 19; Creatinine, Ser 1.91; Hemoglobin 11.0; Platelets 367; Potassium 3.9; Sodium 134; TSH 1.089; TSH 1.148   Recent Lipid Panel    Component Value Date/Time   CHOL 222 (H) 05/24/2020 0426   TRIG 329 (H) 05/24/2020 0426   HDL 35 (L) 05/24/2020 0426   CHOLHDL 6.3 05/24/2020 0426   VLDL 66 (H) 05/24/2020 0426   LDLCALC 121 (H) 05/24/2020 0426   LDLDIRECT 167.0 10/26/2019 1044   Lexiscan Myoview 05/2020: IMPRESSION: 1. No significant reversible ischemia or infarction. 2. Mild global hypokinesis. 3. Left ventricular ejection fraction 43% 4. Non invasive risk  stratification*: Intermediate  Echo 05/24/20: 1. There is a small mass (0.6 cm x 0.6 cm) attached to the [place] or LCC of  the aortic valve (difficult to determine). This likely represents  calcified mass vs papillary fibroelastoma. There is no regurgitation or  significant destruction of the valve to  suggest this is vegetation. Clinical correlation is needed and would  recommend TEE for better characterization. The aortic valve is tricuspid.  Aortic valve regurgitation is not visualized. No aortic stenosis is  present.  2. Left ventricular ejection fraction, by estimation, is 60 to 65%. The  left ventricle has normal function. The left ventricle has no regional  wall motion abnormalities. There is moderate concentric left ventricular  hypertrophy. Left ventricular  diastolic parameters are consistent with Grade II diastolic dysfunction  (pseudonormalization). Elevated left atrial pressure.  3. Right ventricular systolic function is normal. The right ventricular  size is normal. Tricuspid regurgitation signal is inadequate for assessing  PA pressure.  4. The mitral valve is grossly normal. Trivial mitral valve  regurgitation. No evidence of mitral stenosis.  5. The inferior vena cava is normal in size with greater than 50%  respiratory variability, suggesting right atrial pressure of 3 mmHg.   Physical Exam:    VS:  BP (!) 158/88 (BP Location: Right Arm, Patient Position: Sitting, Cuff Size: Large)   Pulse 89   Ht $R'5\' 6"'RQ$  (1.676 m)   Wt 206 lb (93.4 kg)   SpO2 98%   BMI 33.25 kg/m     Wt Readings from Last 3 Encounters:  06/27/20 206 lb (93.4 kg)  05/28/20 199 lb 3.2 oz (90.4 kg)  11/29/19 192 lb (87.1 kg)    VS:  BP (!) 158/88 (BP Location: Right Arm, Patient Position: Sitting, Cuff Size: Large)   Pulse 89   Ht $R'5\' 6"'tR$  (1.676 m)   Wt 206 lb (93.4 kg)  SpO2 98%   BMI 33.25 kg/m  , BMI Body mass index is 33.25 kg/m. GENERAL:  Well appearing HEENT: Pupils equal round and  reactive, fundi not visualized, oral mucosa unremarkable NECK:  No jugular venous distention, waveform within normal limits, carotid upstroke brisk and symmetric, no bruits LUNGS:  Clear to auscultation bilaterally HEART:  RRR.  PMI not displaced or sustained,S1 and S2 within normal limits, no S3, no S4, no clicks, no rubs, no murmurs ABD:  Flat, positive bowel sounds normal in frequency in pitch, no bruits, no rebound, no guarding, no midline pulsatile mass, no hepatomegaly, no splenomegaly EXT:  2 plus pulses throughout, no edema, no cyanosis no clubbing SKIN:  No rashes no nodules NEURO:  Cranial nerves II through XII grossly intact, motor grossly intact throughout PSYCH:  Cognitively intact, oriented to person place and time   ASSESSMENT:    1. Essential hypertension   2. Snoring   3. Apnea   4. Therapeutic drug monitoring   5. Chronic kidney disease, stage 3b (HCC)   6. Class 1 obesity   7. Dyspnea, unspecified type     PLAN:    # Resistant hypertension: BP remains poorly controlled despite multiple medications.  Chlorthalidone was discontinued due to renal dysfunction.  She now has more edema.  We will have her check renal and and aldosterone after holding her irbesartan for 2 days.  At that point we will have her start on spironolactone at 12.5 mg due to her reduced renal function.  Check a basic metabolic panel 1 week after that.  She is drowsy with clonidine pills and has to take them too close together.  We have given her the good Rx coupon so that she will be able to afford the patches.  Continue amlodipine, carvedilol, hydralazine, and irbesartan.  We will refer her to the prep exercise program to the [facility].  We will also refer her for a sleep study.  We discussed the DASH diet and limiting her sodium intake to 1500 mg.  Secondary Causes of Hypertension  Medications/Herbal: OCP, steroids, stimulants, antidepressants, weight loss medication, immune suppressants, NSAIDs,  sympathomimetics, alcohol, caffeine, licorice, ginseng, St. John's wort, chemo: Not contributory  Sleep Apnea: Snores- refer for sleep study Renal artery stenosis: Renal artery Dopplers normal 12/2019. Hyperaldosteronism: No adenomas 10/2017 Hyper/hypothyroidism: Normal 10/2019  Pheochromocytoma: (testing not indicated)  Cushing's syndrome: (testing not indicated) Coarctation of the aorta: BP symmetric  # Chest pain: Symptoms are atypical and occurred when lying down.  She does not have any when walking.  Stress testing was negative.  Getting a sleep study as above.  She does not think it is GERD.  Disposition:    FU with MD/PharmD in 1 month    Medication Adjustments/Labs and Tests Ordered: Current medicines are reviewed at length with the patient today.  Concerns regarding medicines are outlined above.  Orders Placed This Encounter  Procedures  . Aldosterone + renin activity w/ ratio  . Basic metabolic panel  . Split night study   Meds ordered this encounter  Medications  . spironolactone (ALDACTONE) 25 MG tablet    Sig: TAKE 1/2 TABLET DAILY    Dispense:  45 tablet    Refill:  1  . cloNIDine (CATAPRES - DOSED IN MG/24 HR) 0.1 mg/24hr patch    Sig: Place 1 patch (0.1 mg total) onto the skin once a week.    Dispense:  4 patch    Refill:  12    D/C CLONIDINE ORAL  RX     Signed, Skeet Latch, MD  06/27/2020 2:24 PM    [place]

## 2020-06-27 NOTE — Patient Instructions (Signed)
Medication Instructions:  STOP CLONIDINE PILLS  START CLONIDINE PATCH 0.1 MG APPLY ONCE A WEEK   HOLD IRBESARTAN Saturday AND Sunday  RESTART AFTER YOU GET YOUR LABS Monday MORNING  START SPIRONOLACTONE 25 MG 1/2 TABLET Monday AFTER LAB WORK   *If you need a refill on your cardiac medications before your next appointment, please call your pharmacy*  Lab Work: RENIN/ALDOSTERONE Monday MORNING  BMET 1 WEEK AFTER STARTING SPIRONOLACTONE   YOU WILL NEED A COVID SCREENING TEST 3 DAYS PRIOR TO YOUR SLEEP STUDY   If you have labs (blood work) drawn today and your tests are completely normal, you will receive your results only by: Marland Kitchen MyChart Message (if you have MyChart) OR . A paper copy in the mail If you have any lab test that is abnormal or we need to change your treatment, we will call you to review the results.   Testing/Procedures: Your physician has recommended that you have a sleep study. This test records several body functions during sleep, including: brain activity, eye movement, oxygen and carbon dioxide blood levels, heart rate and rhythm, breathing rate and rhythm, the flow of air through your mouth and nose, snoring, body muscle movements, and chest and belly movement. ONCE YOUR INSURANCE HAS APPROVED THE OFFICE WILL CALL YOU TO ARRANGE  Follow-Up: 1 MONTH WITH PHARM D 07/30/2020 AT 8:00 AM    You will receive a phone call from the PREP exercise and nutrition program to schedule an initial assessment.   Special Instructions:   MONITOR YOUR BLOOD PRESSURE TWICE A DAY, LOG IN BOOK PROVIDED. BRING BOOK AND BLOOD PRESSURE MACHINE TO FOLLOW UP    DASH Eating Plan DASH stands for "Dietary Approaches to Stop Hypertension." The DASH eating plan is a healthy eating plan that has been shown to reduce high blood pressure (hypertension). It may also reduce your risk for type 2 diabetes, heart disease, and stroke. The DASH eating plan may also help with weight loss. What are tips  for following this plan?  General guidelines  Avoid eating more than 2,300 mg (milligrams) of salt (sodium) a day. If you have hypertension, you may need to reduce your sodium intake to 1,500 mg a day.  Limit alcohol intake to no more than 1 drink a day for nonpregnant women and 2 drinks a day for men. One drink equals 12 oz of beer, 5 oz of wine, or 1 oz of hard liquor.  Work with your health care provider to maintain a healthy body weight or to lose weight. Ask what an ideal weight is for you.  Get at least 30 minutes of exercise that causes your heart to beat faster (aerobic exercise) most days of the week. Activities may include walking, swimming, or biking.  Work with your health care provider or diet and nutrition specialist (dietitian) to adjust your eating plan to your individual calorie needs. Reading food labels   Check food labels for the amount of sodium per serving. Choose foods with less than 5 percent of the Daily Value of sodium. Generally, foods with less than 300 mg of sodium per serving fit into this eating plan.  To find whole grains, look for the word "whole" as the first word in the ingredient list. Shopping  Buy products labeled as "low-sodium" or "no salt added."  Buy fresh foods. Avoid canned foods and premade or frozen meals. Cooking  Avoid adding salt when cooking. Use salt-free seasonings or herbs instead of table salt or sea salt. Check  with your health care provider or pharmacist before using salt substitutes.  Do not fry foods. Cook foods using healthy methods such as baking, boiling, grilling, and broiling instead.  Cook with heart-healthy oils, such as olive, canola, soybean, or sunflower oil. Meal planning  Eat a balanced diet that includes: ? 5 or more servings of fruits and vegetables each day. At each meal, try to fill half of your plate with fruits and vegetables. ? Up to 6-8 servings of whole grains each day. ? Less than 6 oz of lean meat,  poultry, or fish each day. A 3-oz serving of meat is about the same size as a deck of cards. One egg equals 1 oz. ? 2 servings of low-fat dairy each day. ? A serving of nuts, seeds, or beans 5 times each week. ? Heart-healthy fats. Healthy fats called Omega-3 fatty acids are found in foods such as flaxseeds and coldwater fish, like sardines, salmon, and mackerel.  Limit how much you eat of the following: ? Canned or prepackaged foods. ? Food that is high in trans fat, such as fried foods. ? Food that is high in saturated fat, such as fatty meat. ? Sweets, desserts, sugary drinks, and other foods with added sugar. ? Full-fat dairy products.  Do not salt foods before eating.  Try to eat at least 2 vegetarian meals each week.  Eat more home-cooked food and less restaurant, buffet, and fast food.  When eating at a restaurant, ask that your food be prepared with less salt or no salt, if possible. What foods are recommended? The items listed may not be a complete list. Talk with your dietitian about what dietary choices are best for you. Grains Whole-grain or whole-wheat bread. Whole-grain or whole-wheat pasta. Brown rice. [place]. Bulgur. Whole-grain and low-sodium cereals. Pita bread. Low-fat, low-sodium crackers. Whole-wheat flour tortillas. Vegetables Fresh or frozen vegetables (raw, steamed, roasted, or grilled). Low-sodium or reduced-sodium tomato and vegetable juice. Low-sodium or reduced-sodium tomato sauce and tomato paste. Low-sodium or reduced-sodium canned vegetables. Fruits All fresh, dried, or frozen fruit. Canned fruit in natural juice (without added sugar). Meat and other protein foods Skinless chicken or [place]. Ground chicken or [place]. Pork with fat trimmed off. Fish and seafood. Egg whites. Dried beans, peas, or lentils. Unsalted nuts, nut butters, and seeds. Unsalted canned beans. Lean cuts of beef with fat trimmed off. Low-sodium, lean deli meat. Dairy Low-fat  (1%) or fat-free (skim) milk. Fat-free, low-fat, or reduced-fat cheeses. Nonfat, low-sodium ricotta or cottage cheese. Low-fat or nonfat yogurt. Low-fat, low-sodium cheese. Fats and oils Soft margarine without trans fats. Vegetable oil. Low-fat, reduced-fat, or light mayonnaise and salad dressings (reduced-sodium). Canola, safflower, olive, soybean, and sunflower oils. Avocado. Seasoning and other foods Herbs. Spices. Seasoning mixes without salt. Unsalted popcorn and pretzels. Fat-free sweets. What foods are not recommended? The items listed may not be a complete list. Talk with your dietitian about what dietary choices are best for you. Grains Baked goods made with fat, such as croissants, muffins, or some breads. Dry pasta or rice meal packs. Vegetables Creamed or fried vegetables. Vegetables in a cheese sauce. Regular canned vegetables (not low-sodium or reduced-sodium). Regular canned tomato sauce and paste (not low-sodium or reduced-sodium). Regular tomato and vegetable juice (not low-sodium or reduced-sodium). Angie Fava. Olives. Fruits Canned fruit in a light or heavy syrup. Fried fruit. Fruit in cream or butter sauce. Meat and other protein foods Fatty cuts of meat. Ribs. Fried meat. Berniece Salines. Sausage. Bologna and other processed lunch  meats. Salami. Fatback. Hotdogs. Bratwurst. Salted nuts and seeds. Canned beans with added salt. Canned or smoked fish. Whole eggs or egg yolks. Chicken or [place] with skin. Dairy Whole or 2% milk, cream, and half-and-half. Whole or full-fat cream cheese. Whole-fat or sweetened yogurt. Full-fat cheese. Nondairy creamers. Whipped toppings. Processed cheese and cheese spreads. Fats and oils Butter. Stick margarine. Lard. Shortening. Ghee. Bacon fat. Tropical oils, such as coconut, palm kernel, or palm oil. Seasoning and other foods Salted popcorn and pretzels. Onion salt, garlic salt, seasoned salt, table salt, and sea salt. Worcestershire sauce. Tartar sauce.  Barbecue sauce. Teriyaki sauce. Soy sauce, including reduced-sodium. Steak sauce. Canned and packaged gravies. Fish sauce. Oyster sauce. Cocktail sauce. Horseradish that you find on the shelf. Ketchup. Mustard. Meat flavorings and tenderizers. Bouillon cubes. Hot sauce and Tabasco sauce. Premade or packaged marinades. Premade or packaged taco seasonings. Relishes. Regular salad dressings. Where to find more information:  National Heart, Lung, and [place]: https://wilson-eaton.com/  American Heart Association: www.heart.org Summary  The DASH eating plan is a healthy eating plan that has been shown to reduce high blood pressure (hypertension). It may also reduce your risk for type 2 diabetes, heart disease, and stroke.  With the DASH eating plan, you should limit salt (sodium) intake to 2,300 mg a day. If you have hypertension, you may need to reduce your sodium intake to 1,500 mg a day.  When on the DASH eating plan, aim to eat more fresh fruits and vegetables, whole grains, lean proteins, low-fat dairy, and heart-healthy fats.  Work with your health care provider or diet and nutrition specialist (dietitian) to adjust your eating plan to your individual calorie needs. This information is not intended to replace advice given to you by your health care provider. Make sure you discuss any questions you have with your health care provider. Document Released: 08/06/2011 Document Revised: 07/30/2017 Document Reviewed: 08/10/2016 Elsevier Patient Education  2020 [employer].

## 2020-06-28 ENCOUNTER — Telehealth: Payer: Self-pay

## 2020-06-28 NOTE — Telephone Encounter (Signed)
Call placed reference referral to PREP  Currently at work and would like a call back when she is not working to discuss.

## 2020-07-04 ENCOUNTER — Telehealth: Payer: Self-pay | Admitting: Cardiovascular Disease

## 2020-07-04 NOTE — Telephone Encounter (Signed)
Sleep study still pending Authorization Pending, auth # [id number]

## 2020-07-05 ENCOUNTER — Other Ambulatory Visit: Payer: Self-pay | Admitting: Family Medicine

## 2020-07-05 DIAGNOSIS — F32A Depression, unspecified: Secondary | ICD-10-CM

## 2020-07-05 NOTE — Telephone Encounter (Signed)
Left a message for pt to call the office and schedule a f/u for medication( Hydroxyzine) renewal

## 2020-07-08 ENCOUNTER — Ambulatory Visit: Payer: 59 | Admitting: Family Medicine

## 2020-07-08 ENCOUNTER — Telehealth: Payer: Self-pay | Admitting: *Deleted

## 2020-07-08 DIAGNOSIS — R0681 Apnea, not elsewhere classified: Secondary | ICD-10-CM

## 2020-07-08 DIAGNOSIS — R0683 Snoring: Secondary | ICD-10-CM

## 2020-07-08 NOTE — Telephone Encounter (Signed)
Casey Acosta per Dr Oval Linsey, orders placed

## 2020-07-08 NOTE — Telephone Encounter (Signed)
-----   Message from Imagene Gurney sent at 07/08/2020  9:57 AM EST ----- Marykay Lex,   The authorization was not approved for a split night study, can we order a Home sleep test? ----- Message ----- From: Earvin Hansen, LPN Sent: 15/52/0802   5:40 PM EST To: Earvin Hansen, LPN, [facility] Sleep Studies  Hello all Patient needs sleep study Thanks Grenelefe

## 2020-07-10 NOTE — Telephone Encounter (Signed)
HST Auth approved, case # [id number]

## 2020-07-10 NOTE — Telephone Encounter (Signed)
Split night denied, HST order submitted and pending.Marland Kitchen Reference # [id number]

## 2020-07-12 ENCOUNTER — Ambulatory Visit (INDEPENDENT_AMBULATORY_CARE_PROVIDER_SITE_OTHER): Payer: 59 | Admitting: Family Medicine

## 2020-07-12 ENCOUNTER — Encounter: Payer: Self-pay | Admitting: Family Medicine

## 2020-07-12 ENCOUNTER — Other Ambulatory Visit: Payer: Self-pay

## 2020-07-12 VITALS — BP 188/110 | HR 99 | Temp 98.7°F | Wt 207.0 lb

## 2020-07-12 DIAGNOSIS — F419 Anxiety disorder, unspecified: Secondary | ICD-10-CM

## 2020-07-12 DIAGNOSIS — I10 Essential (primary) hypertension: Secondary | ICD-10-CM | POA: Diagnosis not present

## 2020-07-12 DIAGNOSIS — F32A Depression, unspecified: Secondary | ICD-10-CM

## 2020-07-12 DIAGNOSIS — E785 Hyperlipidemia, unspecified: Secondary | ICD-10-CM

## 2020-07-12 DIAGNOSIS — E1169 Type 2 diabetes mellitus with other specified complication: Secondary | ICD-10-CM

## 2020-07-12 MED ORDER — GLIMEPIRIDE 1 MG PO TABS: 2.0000 mg | ORAL_TABLET | Freq: Every day | ORAL | 5 refills | Status: DC

## 2020-07-12 MED ORDER — SERTRALINE HCL 25 MG PO TABS: 25.0000 mg | ORAL_TABLET | Freq: Every day | ORAL | 1 refills | Status: DC

## 2020-07-12 MED ORDER — AMLODIPINE BESYLATE 10 MG PO TABS: 10.0000 mg | ORAL_TABLET | Freq: Every day | ORAL | 2 refills | Status: DC

## 2020-07-12 MED ORDER — ROSUVASTATIN CALCIUM 10 MG PO TABS: 10.0000 mg | ORAL_TABLET | Freq: Every day | ORAL | 3 refills | Status: DC

## 2020-07-12 MED ORDER — METFORMIN HCL 500 MG PO TABS: 500.0000 mg | ORAL_TABLET | Freq: Every day | ORAL | 3 refills | Status: DC

## 2020-07-12 NOTE — Patient Instructions (Addendum)
Managing Anxiety, Adult After being diagnosed with an anxiety disorder, you may be relieved to know why you have felt or behaved a certain way. You may also feel overwhelmed about the treatment ahead and what it will mean for your life. With care and support, you can manage this condition and recover from it. How to manage lifestyle changes Managing stress and anxiety  Stress is your body's reaction to life changes and events, both good and bad. Most stress will last just a few hours, but stress can be ongoing and can lead to more than just stress. Although stress can play a major role in anxiety, it is not the same as anxiety. Stress is usually caused by something external, such as a deadline, test, or competition. Stress normally passes after the triggering event has ended.  Anxiety is caused by something internal, such as imagining a terrible outcome or worrying that something will go wrong that will devastate you. Anxiety often does not go away even after the triggering event is over, and it can become long-term (chronic) worry. It is important to understand the differences between stress and anxiety and to manage your stress effectively so that it does not lead to an anxious response. Talk with your health care provider or a counselor to learn more about reducing anxiety and stress. He or she may suggest tension reduction techniques, such as:  Music therapy. This can include creating or listening to music that you enjoy and that inspires you.  Mindfulness-based meditation. This involves being aware of your normal breaths while not trying to control your breathing. It can be done while sitting or walking.  Centering prayer. This involves focusing on a word, phrase, or sacred image that means something to you and brings you peace.  Deep breathing. To do this, expand your stomach and inhale slowly through your nose. Hold your breath for 3-5 seconds. Then exhale slowly, letting your stomach muscles  relax.  Self-talk. This involves identifying thought patterns that lead to anxiety reactions and changing those patterns.  Muscle relaxation. This involves tensing muscles and then relaxing them. Choose a tension reduction technique that suits your lifestyle and personality. These techniques take time and practice. Set aside 5-15 minutes a day to do them. Therapists can offer counseling and training in these techniques. The training to help with anxiety may be covered by some insurance plans. Other things you can do to manage stress and anxiety include:  Keeping a stress/anxiety diary. This can help you learn what triggers your reaction and then learn ways to manage your response.  Thinking about how you react to certain situations. You may not be able to control everything, but you can control your response.  Making time for activities that help you relax and not feeling guilty about spending your time in this way.  Visual imagery and yoga can help you stay calm and relax.  Medicines Medicines can help ease symptoms. Medicines for anxiety include:  Anti-anxiety drugs.  Antidepressants. Medicines are often used as a primary treatment for anxiety disorder. Medicines will be prescribed by a health care provider. When used together, medicines, psychotherapy, and tension reduction techniques may be the most effective treatment. Relationships Relationships can play a big part in helping you recover. Try to spend more time connecting with trusted friends and family members. Consider going to couples counseling, taking family education classes, or going to family therapy. Therapy can help you and others better understand your condition. How to recognize changes in your   anxiety Everyone responds differently to treatment for anxiety. Recovery from anxiety happens when symptoms decrease and stop interfering with your daily activities at home or work. This may mean that you will start to:  Have  better concentration and focus. Worry will interfere less in your daily thinking.  Sleep better.  Be less irritable.  Have more energy.  Have improved memory. It is important to recognize when your condition is getting worse. Contact your health care provider if your symptoms interfere with home or work and you feel like your condition is not improving. Follow these instructions at home: Activity  Exercise. Most adults should do the following: ? Exercise for at least 150 minutes each week. The exercise should increase your heart rate and make you sweat (moderate-intensity exercise). ? Strengthening exercises at least twice a week.  Get the right amount and quality of sleep. Most adults need 7-9 hours of sleep each night. Lifestyle   Eat a healthy diet that includes plenty of vegetables, fruits, whole grains, low-fat dairy products, and lean protein. Do not eat a lot of foods that are high in solid fats, added sugars, or salt.  Make choices that simplify your life.  Do not use any products that contain nicotine or tobacco, such as cigarettes, e-cigarettes, and chewing tobacco. If you need help quitting, ask your health care provider.  Avoid caffeine, alcohol, and certain over-the-counter cold medicines. These may make you feel worse. Ask your pharmacist which medicines to avoid. General instructions  Take over-the-counter and prescription medicines only as told by your health care provider.  Keep all follow-up visits as told by your health care provider. This is important. Where to find support You can get help and support from these sources:  Self-help groups.  Online and community organizations.  A trusted spiritual leader.  Couples counseling.  Family education classes.  Family therapy. Where to find more information You may find that joining a support group helps you deal with your anxiety. The following sources can help you locate counselors or support groups near  you:  Mental Health [place]: www.mentalhealthamerica.net  Anxiety and Depression Association of America (ADAA): www.adaa.org  National Alliance on Mental Illness (NAMI): www.nami.org Contact a health care provider if you:  Have a hard time staying focused or finishing daily tasks.  Spend many hours a day feeling worried about everyday life.  Become exhausted by worry.  Start to have headaches, feel tense, or have nausea.  Urinate more than normal.  Have diarrhea. Get help right away if you have:  A racing heart and shortness of breath.  Thoughts of hurting yourself or others. If you ever feel like you may hurt yourself or others, or have thoughts about taking your own life, get help right away. You can go to your nearest emergency department or call:  Your local emergency services (911 in the [place]  A suicide crisis helpline, such as the National Suicide Prevention Lifeline at 1-800-273-8255. This is open 24 hours a day. Summary  Taking steps to learn and use tension reduction techniques can help calm you and help prevent triggering an anxiety reaction.  When used together, medicines, psychotherapy, and tension reduction techniques may be the most effective treatment.  Family, friends, and partners can play a big part in helping you recover from an anxiety disorder. This information is not intended to replace advice given to you by your health care provider. Make sure you discuss any questions you have with your health care provider. Document Revised:   01/17/2019 Document Reviewed: 01/17/2019 Elsevier Patient Education  2020 [employer]  Managing Your Hypertension Hypertension is commonly called high blood pressure. This is when the force of your blood pressing against the walls of your arteries is too strong. Arteries are blood vessels that carry blood from your heart throughout your body. Hypertension forces the heart to work harder to pump blood, and may cause the  arteries to become narrow or stiff. Having untreated or uncontrolled hypertension can cause heart attack, stroke, kidney disease, and other problems. What are blood pressure readings? A blood pressure reading consists of a higher number over a lower number. Ideally, your blood pressure should be below 120/80. The first ("top") number is called the systolic pressure. It is a measure of the pressure in your arteries as your heart beats. The second ("bottom") number is called the diastolic pressure. It is a measure of the pressure in your arteries as the heart relaxes. What does my blood pressure reading mean? Blood pressure is classified into four stages. Based on your blood pressure reading, your health care provider may use the following stages to determine what type of treatment you need, if any. Systolic pressure and diastolic pressure are measured in a unit called mm Hg. Normal  Systolic pressure: below 120.  Diastolic pressure: below 80. Elevated  Systolic pressure: 120-129.  Diastolic pressure: below 80. Hypertension stage 1  Systolic pressure: 130-139.  Diastolic pressure: 80-89. Hypertension stage 2  Systolic pressure: 140 or above.  Diastolic pressure: 90 or above. What health risks are associated with hypertension? Managing your hypertension is an important responsibility. Uncontrolled hypertension can lead to:  A heart attack.  A stroke.  A weakened blood vessel (aneurysm).  Heart failure.  Kidney damage.  Eye damage.  Metabolic syndrome.  Memory and concentration problems. What changes can I make to manage my hypertension? Hypertension can be managed by making lifestyle changes and possibly by taking medicines. Your health care provider will help you make a plan to bring your blood pressure within a normal range. Eating and drinking   Eat a diet that is high in fiber and potassium, and low in salt (sodium), added sugar, and fat. An example eating plan is  called the DASH (Dietary Approaches to Stop Hypertension) diet. To eat this way: ? Eat plenty of fresh fruits and vegetables. Try to fill half of your plate at each meal with fruits and vegetables. ? Eat whole grains, such as whole wheat pasta, brown rice, or whole grain bread. Fill about one quarter of your plate with whole grains. ? Eat low-fat diary products. ? Avoid fatty cuts of meat, processed or cured meats, and poultry with skin. Fill about one quarter of your plate with lean proteins such as fish, chicken without skin, beans, eggs, and tofu. ? Avoid premade and processed foods. These tend to be higher in sodium, added sugar, and fat.  Reduce your daily sodium intake. Most people with hypertension should eat less than 1,500 mg of sodium a day.  Limit alcohol intake to no more than 1 drink a day for nonpregnant women and 2 drinks a day for men. One drink equals 12 oz of beer, 5 oz of wine, or 1 oz of hard liquor. Lifestyle  Work with your health care provider to maintain a healthy body weight, or to lose weight. Ask what an ideal weight is for you.  Get at least 30 minutes of exercise that causes your heart to beat faster (aerobic exercise) most days   of the week. Activities may include walking, swimming, or biking.  Include exercise to strengthen your muscles (resistance exercise), such as weight lifting, as part of your weekly exercise routine. Try to do these types of exercises for 30 minutes at least 3 days a week.  Do not use any products that contain nicotine or tobacco, such as cigarettes and e-cigarettes. If you need help quitting, ask your health care provider.  Control any long-term (chronic) conditions you have, such as high cholesterol or diabetes. Monitoring  Monitor your blood pressure at home as told by your health care provider. Your personal target blood pressure may vary depending on your medical conditions, your age, and other factors.  Have your blood pressure  checked regularly, as often as told by your health care provider. Working with your health care provider  Review all the medicines you take with your health care provider because there may be side effects or interactions.  Talk with your health care provider about your diet, exercise habits, and other lifestyle factors that may be contributing to hypertension.  Visit your health care provider regularly. Your health care provider can help you create and adjust your plan for managing hypertension. Will I need medicine to control my blood pressure? Your health care provider may prescribe medicine if lifestyle changes are not enough to get your blood pressure under control, and if:  Your systolic blood pressure is 130 or higher.  Your diastolic blood pressure is 80 or higher. Take medicines only as told by your health care provider. Follow the directions carefully. Blood pressure medicines must be taken as prescribed. The medicine does not work as well when you skip doses. Skipping doses also puts you at risk for problems. Contact a health care provider if:  You think you are having a reaction to medicines you have taken.  You have repeated (recurrent) headaches.  You feel dizzy.  You have swelling in your ankles.  You have trouble with your vision. Get help right away if:  You develop a severe headache or confusion.  You have unusual weakness or numbness, or you feel faint.  You have severe pain in your chest or abdomen.  You vomit repeatedly.  You have trouble breathing. Summary  Hypertension is when the force of blood pumping through your arteries is too strong. If this condition is not controlled, it may put you at risk for serious complications.  Your personal target blood pressure may vary depending on your medical conditions, your age, and other factors. For most people, a normal blood pressure is less than 120/80.  Hypertension is managed by lifestyle changes,  medicines, or both. Lifestyle changes include weight loss, eating a healthy, low-sodium diet, exercising more, and limiting alcohol. This information is not intended to replace advice given to you by your health care provider. Make sure you discuss any questions you have with your health care provider. Document Revised: 12/09/2018 Document Reviewed: 07/15/2016 Elsevier Patient Education  2020 [place] With Depression Everyone experiences occasional disappointment, sadness, and loss in their lives. When you are feeling down, blue, or sad for at least 2 weeks in a row, it may mean that you have depression. Depression can affect your thoughts and feelings, relationships, daily activities, and physical health. It is caused by changes in the way your brain functions. If you receive a diagnosis of depression, your health care provider will tell you which type of depression you have and what treatment options are available to you. If  you are living with depression, there are ways to help you recover from it and also ways to prevent it from coming back. How to cope with lifestyle changes Coping with stress     Stress is your body's reaction to life changes and events, both good and bad. Stressful situations may include:  Getting married.  The death of a spouse.  Losing a job.  Retiring.  Having a baby. Stress can last just a few hours or it can be ongoing. Stress can play a major role in depression, so it is important to learn both how to cope with stress and how to think about it differently. Talk with your health care provider or a counselor if you would like to learn more about stress reduction. He or she may suggest some stress reduction techniques, such as:  Music therapy. This can include creating music or listening to music. Choose music that you enjoy and that inspires you.  Mindfulness-based meditation. This kind of meditation can be done while sitting or walking. It  involves being aware of your normal breaths, rather than trying to control your breathing.  Centering prayer. This is a kind of meditation that involves focusing on a spiritual word or phrase. Choose a word, phrase, or sacred image that is meaningful to you and that brings you peace.  Deep breathing. To do this, expand your stomach and inhale slowly through your nose. Hold your breath for 3-5 seconds, then exhale slowly, allowing your stomach muscles to relax.  Muscle relaxation. This involves intentionally tensing muscles then relaxing them. Choose a stress reduction technique that fits your lifestyle and personality. Stress reduction techniques take time and practice to develop. Set aside 5-15 minutes a day to do them. Therapists can offer training in these techniques. The training may be covered by some insurance plans. Other things you can do to manage stress include:  Keeping a stress diary. This can help you learn what triggers your stress and ways to control your response.  Understanding what your limits are and saying no to requests or events that lead to a schedule that is too full.  Thinking about how you respond to certain situations. You may not be able to control everything, but you can control how you react.  Adding humor to your life by watching funny films or TV shows.  Making time for activities that help you relax and not feeling guilty about spending your time this way.  Medicines Your health care provider may suggest certain medicines if he or she feels that they will help improve your condition. Avoid using alcohol and other substances that may prevent your medicines from working properly (may interact). It is also important to:  Talk with your pharmacist or health care provider about all the medicines that you take, their possible side effects, and what medicines are safe to take together.  Make it your goal to take part in all treatment decisions (shared  decision-making). This includes giving input on the side effects of medicines. It is best if shared decision-making with your health care provider is part of your total treatment plan. If your health care provider prescribes a medicine, you may not notice the full benefits of it for 4-8 weeks. Most people who are treated for depression need to be on medicine for at least 6-12 months after they feel better. If you are taking medicines as part of your treatment, do not stop taking medicines without first talking to your health care provider. You  may need to have the medicine slowly decreased (tapered) over time to decrease the risk of harmful side effects. Relationships Your health care provider may suggest family therapy along with individual therapy and drug therapy. While there may not be family problems that are causing you to feel depressed, it is still important to make sure your family learns as much as they can about your mental health. Having your family's support can help make your treatment successful. How to recognize changes in your condition Everyone has a different response to treatment for depression. Recovery from major depression happens when you have not had signs of major depression for two months. This may mean that you will start to:  Have more interest in doing activities.  Feel less hopeless than you did 2 months ago.  Have more energy.  Overeat less often, or have better or improving appetite.  Have better concentration. Your health care provider will work with you to decide the next steps in your recovery. It is also important to recognize when your condition is getting worse. Watch for these signs:  Having fatigue or low energy.  Eating too much or too little.  Sleeping too much or too little.  Feeling restless, agitated, or hopeless.  Having trouble concentrating or making decisions.  Having unexplained physical complaints.  Feeling irritable, angry, or  aggressive. Get help as soon as you or your family members notice these symptoms coming back. How to get support and help from others How to talk with friends and family members about your condition  Talking to friends and family members about your condition can provide you with one way to get support and guidance. Reach out to trusted friends or family members, explain your symptoms to them, and let them know that you are working with a health care provider to treat your depression. Financial resources Not all insurance plans cover mental health care, so it is important to check with your insurance carrier. If paying for co-pays or counseling services is a problem, search for a local or county mental health care center. They may be able to offer public mental health care services at low or no cost when you are not able to see a private health care provider. If you are taking medicine for depression, you may be able to get the generic form, which may be less expensive. Some makers of prescription medicines also offer help to patients who cannot afford the medicines they need. Follow these instructions at home:   Get the right amount and quality of sleep.  Cut down on using caffeine, tobacco, alcohol, and other potentially harmful substances.  Try to exercise, such as walking or lifting small weights.  Take over-the-counter and prescription medicines only as told by your health care provider.  Eat a healthy diet that includes plenty of vegetables, fruits, whole grains, low-fat dairy products, and lean protein. Do not eat a lot of foods that are high in solid fats, added sugars, or salt.  Keep all follow-up visits as told by your health care provider. This is important. Contact a health care provider if:  You stop taking your antidepressant medicines, and you have any of these symptoms: ? Nausea. ? Headache. ? Feeling lightheaded. ? Chills and body aches. ? Not being able to sleep  (insomnia).  You or your friends and family think your depression is getting worse. Get help right away if:  You have thoughts of hurting yourself or others. If you ever feel like you may  hurt yourself or others, or have thoughts about taking your own life, get help right away. You can go to your nearest emergency department or call:  Your local emergency services (911 in the [place]  A suicide crisis helpline, such as the [facility] at 8066027304. This is open 24-hours a day. Summary  If you are living with depression, there are ways to help you recover from it and also ways to prevent it from coming back.  Work with your health care team to create a management plan that includes counseling, stress management techniques, and healthy lifestyle habits. This information is not intended to replace advice given to you by your health care provider. Make sure you discuss any questions you have with your health care provider. Document Revised: 12/09/2018 Document Reviewed: 07/20/2016 Elsevier Patient Education  [place].

## 2020-07-12 NOTE — Progress Notes (Signed)
Subjective:    Patient ID: Casey Acosta, female    DOB: Nov 29, 1970, 49 y.o.   MRN: [id number]  No chief complaint on file.   HPI Patient was seen today for f/u.  Pt frustrated as she had difficulty getting meds from pharmacy.  Out of bp meds.  Has lab appt on Monday with Cardiology.  Meds likely to be adjusted based on results.  Pt was taking norvasc 10 mg, coreg, and hydralazine.  Advised to hold irbesartan prior to labs.  Has not started spironolactone or clonidine patch.  Pt with chest pain when bp elevated.    Pt notes increased anxiety and work and in personal life.  Pt is getting married on Sunday.  Pt received a call while in clinic that her daughter won't be able to attend as she is COVID + despite being fully vaccinated.  Pt got her COVID booster vaccine last wk.  In the past pt took Wellbutrin at night, but patient to be sleepy all day when taken at night.  Celexa was not helpful.  Patient requesting a refill on Metformin and glimepiride.   Past Medical History:  Diagnosis Date  . Anemia   . Anxiety   . Anxiety and depression   . Chronic kidney disease   . Colon polyp   . Diabetes mellitus without complication ([place])   . Dysuria   . High risk sexual behavior   . History of syncope   . Hyperlipidemia   . Hypertension   . Internal hemorrhoids   . LVH (left ventricular hypertrophy)   . Malaise and fatigue   . Obesity (BMI 30-39.9)   . Ovarian mass   . Panic disorder   . PCOS (polycystic ovarian syndrome)   . Rectal bleeding   . Shortness of breath   . Surgical menopause   . Vaginal trichomoniasis     Allergies  Allergen Reactions  . Dilaudid [Hydromorphone] Nausea And Vomiting  . Prozac [Fluoxetine Hcl]     hallucinations    ROS General: Denies fever, chills, night sweats, changes in weight, changes in appetite HEENT: Denies headaches, ear pain, changes in vision, rhinorrhea, sore throat CV: Denies CP, palpitations, SOB, orthopnea Pulm: Denies SOB, cough,  wheezing GI: Denies abdominal pain, nausea, vomiting, diarrhea, constipation GU: Denies dysuria, hematuria, frequency, vaginal discharge Msk: Denies muscle cramps, joint pains Neuro: Denies weakness, numbness, tingling Skin: Denies rashes, bruising Psych: Denies hallucinations + anxiety, depression    Objective:    Blood pressure (!) 188/110, pulse 99, temperature 98.7 F (37.1 C), temperature source Oral, weight 207 lb (93.9 kg), SpO2 98 %.   Gen. Pleasant, well-nourished, in no distress, normal affect   HEENT: [place]/AT, face symmetric, conjunctiva clear, no scleral icterus, PERRLA, EOMI, nares patent without drainage Lungs: no accessory muscle use, CTAB, no wheezes or rales Cardiovascular: RRR, no m/r/g, no peripheral edema Musculoskeletal: No deformities, no cyanosis or clubbing, normal tone Neuro:  A&Ox3, CN II-XII intact, normal gait Skin:  Warm, no lesions/ rash   Wt Readings from Last 3 Encounters:  06/27/20 206 lb (93.4 kg)  05/28/20 199 lb 3.2 oz (90.4 kg)  11/29/19 192 lb (87.1 kg)    Lab Results  Component Value Date   WBC 10.5 05/27/2020   HGB 11.0 (L) 05/27/2020   HCT 34.1 (L) 05/27/2020   PLT 367 05/27/2020   GLUCOSE 177 (H) 05/27/2020   CHOL 222 (H) 05/24/2020   TRIG 329 (H) 05/24/2020   HDL 35 (L) 05/24/2020   LDLDIRECT 167.0  10/26/2019   LDLCALC 121 (H) 05/24/2020   ALT 22 05/20/2018   AST 15 05/20/2018   NA 134 (L) 05/27/2020   K 3.9 05/27/2020   CL 101 05/27/2020   CREATININE 1.91 (H) 05/27/2020   BUN 19 05/27/2020   CO2 22 05/27/2020   TSH 1.089 05/27/2020   TSH 1.148 05/27/2020   INR 0.9 05/27/2020   HGBA1C 10.8 (H) 05/24/2020   MICROALBUR 95.1 (H) 02/23/2018    Assessment/Plan:  Essential hypertension  -uncontrolled. Likely 2/2 being out of medication -Pharmacy contacted in regards to refills. -Patient encouraged to restart medications including Norvasc 10 mg, Coreg 25 mg twice daily, hydralazine 100 mg 3 times daily. -Per patient  advised to hold irbesartan 300 mg prior to labs. -will wait on cardiology input before starting spironolactone 12.5 mg and clonidine 0.1 mg patch. -Continue follow-up with cardiology -Continue lifestyle modifications -Given precautions - Plan: amLODipine (NORVASC) 10 MG tablet  Type 2 diabetes mellitus with other specified complication, without long-term current use of insulin (HCC) -Discussed lifestyle modifications -Last hemoglobin A1c 7.8% on 05/24/2020 -Restart current medications including glimepiride 2 mg daily, Metformin 500 mg, Ozempic 0.5 mg weekly. -Follow-up with endocrinology - Plan: glimepiride (AMARYL) 1 MG tablet, metFORMIN (GLUCOPHAGE) 500 MG tablet  Hyperlipidemia, unspecified hyperlipidemia type -Total cholesterol 222, HDL 35, LDL 121, triglycerides 329 on 05/24/2020 -Lifestyle modification strongly encouraged - Plan: rosuvastatin (CRESTOR) 10 MG tablet  Anxiety and depression -PHQ-9 score 11 -GAD-7 score 15 -Discussed medication options and counseling -Will restart Zoloft 25 mg -Patient strongly encouraged to consider counseling -Discussed self-care -Given precautions - Plan: sertraline (ZOLOFT) 25 MG tablet  F/u in 4-6 wks, sooner if needed.  Grier Mitts, MD

## 2020-07-22 ENCOUNTER — Encounter: Payer: Self-pay | Admitting: Family Medicine

## 2020-07-30 ENCOUNTER — Telehealth: Payer: Self-pay

## 2020-07-30 ENCOUNTER — Ambulatory Visit: Payer: 59

## 2020-07-30 NOTE — Telephone Encounter (Signed)
Called and lmomed the pt to call back regarding rescheduling appt

## 2020-07-30 NOTE — Progress Notes (Deleted)
07/30/2020 Casey Acosta [birth year] [id number]   HPI:  Casey Acosta is a 49 y.o. female patient of Dr Oval Linsey, with a PMH below who presents today for advanced hypertension clinic follow up.  She was seen by Dr. Oval Linsey on October 28 and found to have a blood pressure of 158/88.  She had previously been unable to afford clonidine patches and was given the tables instead.  Patient complained that these caused drowsiness and could only take after work (5 pm and 11 pm). At that visit she was given Good Rx information to get patches at reasonable price, stop the irbesartan for 2 days and get hyperaldosterone panel drawn, then resume irbesartan and add spironolactone 12.5 mg daily.   She saw her PCP about 2 weeks ago and had not yet started the patches, or had her blood drawn.  Her pressure that day was 188/110 and the MD noted that she had been out of several medications, including amlodipine and sertraline.    Today she returns for follow up.    Past Medical History: hyperlipidemia 9/21 TC 222, TG 329, HDL 35, LDL 121 , on rosuvastatin 10 mg   DM2 9/21 A1c 10.8, on glimepiride, metformin and semaglutide  CKD Stage 3, most recent GFR at 35   Secondary Causes of Hypertension  Medications/Herbal: OCP, steroids, stimulants, antidepressants, weight loss medication, immune suppressants, NSAIDs, sympathomimetics, alcohol, caffeine, licorice, ginseng, St. John's wort, chemo: Not contributory  Sleep Apnea: Snores- sleep study scheduled for Dec 6 Renal artery stenosis: Renal artery Dopplers normal 12/2019. Hyperaldosteronism: No adenomas 10/2017 Hyper/hypothyroidism: Normal 10/2019  Pheochromocytoma: (testing not indicated)  Cushing's syndrome: (testing not indicated) Coarctation of the aorta: BP symmetric   Blood Pressure Goal:  130/80  Current Medications: amlodipine 10 mg qd, carvedilol 25 mg bid, hydralazine 100 mg qid, irbesartan 300 mg qd, clonidine 0.1 mg patch  Family  Hx:  Social Hx:  Diet:  Exercise:  Home BP readings:  Intolerances:   Labs: 9/21: Na 134, K 3.9, Glu 177, BUN 19, SCr 1.91 (at hospital, chlorthalidone discontinued)  Wt Readings from Last 3 Encounters:  07/12/20 207 lb (93.9 kg)  06/27/20 206 lb (93.4 kg)  05/28/20 199 lb 3.2 oz (90.4 kg)   BP Readings from Last 3 Encounters:  07/12/20 (!) 188/110  06/27/20 (!) 158/88  05/28/20 (!) 150/95   Pulse Readings from Last 3 Encounters:  07/12/20 99  06/27/20 89  05/28/20 84    Current Outpatient Medications  Medication Sig Dispense Refill  . acetaminophen (TYLENOL) 500 MG tablet Take 1,000 mg by mouth every 6 (six) hours as needed for mild pain, moderate pain, fever or headache.    Marland Kitchen amLODipine (NORVASC) 10 MG tablet Take 1 tablet (10 mg total) by mouth daily. 30 tablet 2  . aspirin EC 81 MG tablet Take 81 mg by mouth daily.    . blood glucose meter kit and supplies KIT Dispense based on patient and insurance preference. Use up to four times daily as directed. 1 each 0  . carvedilol (COREG) 25 MG tablet Take 1 tablet (25 mg total) by mouth 2 (two) times daily with a meal. 60 tablet 2  . cloNIDine (CATAPRES - DOSED IN MG/24 HR) 0.1 mg/24hr patch Place 1 patch (0.1 mg total) onto the skin once a week. 4 patch 12  . glimepiride (AMARYL) 1 MG tablet Take 2 tablets (2 mg total) by mouth daily with breakfast. 60 tablet 5  . hydrALAZINE (APRESOLINE) 100 MG tablet Take  1 tablet (100 mg total) by mouth 4 (four) times daily. 120 tablet 2  . hydrOXYzine (ATARAX/VISTARIL) 50 MG tablet TAKE ONE TABLET BY MOUTH THREE TIMES A DAY AS NEEDED 60 tablet 1  . irbesartan (AVAPRO) 300 MG tablet Take 1 tablet (300 mg total) by mouth daily. 30 tablet 2  . metFORMIN (GLUCOPHAGE) 500 MG tablet Take 1 tablet (500 mg total) by mouth daily with breakfast. 90 tablet 3  . rosuvastatin (CRESTOR) 10 MG tablet Take 1 tablet (10 mg total) by mouth daily. 30 tablet 3  . Semaglutide,0.25 or 0.5MG/DOS, (OZEMPIC,  0.25 OR 0.5 MG/DOSE,) 2 MG/1.5ML SOPN Inject 0.5 mg into the skin once a week. 1.5 mL 0  . sertraline (ZOLOFT) 25 MG tablet Take 1 tablet (25 mg total) by mouth daily. 30 tablet 1  . spironolactone (ALDACTONE) 25 MG tablet TAKE 1/2 TABLET DAILY 45 tablet 1   No current facility-administered medications for this visit.    Allergies  Allergen Reactions  . Dilaudid [Hydromorphone] Nausea And Vomiting  . Prozac [Fluoxetine Hcl]     hallucinations    Past Medical History:  Diagnosis Date  . Anemia   . Anxiety   . Anxiety and depression   . Chronic kidney disease   . Colon polyp   . Diabetes mellitus without complication ([place])   . Dysuria   . High risk sexual behavior   . History of syncope   . Hyperlipidemia   . Hypertension   . Internal hemorrhoids   . LVH (left ventricular hypertrophy)   . Malaise and fatigue   . Obesity (BMI 30-39.9)   . Ovarian mass   . Panic disorder   . PCOS (polycystic ovarian syndrome)   . Rectal bleeding   . Shortness of breath   . Surgical menopause   . Vaginal trichomoniasis     There were no vitals taken for this visit.  No problem-specific Assessment & Plan notes found for this encounter.   Tommy Medal PharmD CPP [facility] [address] [place], [place] [address] [phone]

## 2020-07-31 ENCOUNTER — Encounter: Payer: Self-pay | Admitting: Obstetrics and Gynecology

## 2020-07-31 DIAGNOSIS — I213 ST elevation (STEMI) myocardial infarction of unspecified site: Secondary | ICD-10-CM

## 2020-07-31 HISTORY — DX: ST elevation (STEMI) myocardial infarction of unspecified site: I21.3

## 2020-08-01 NOTE — Telephone Encounter (Signed)
I would like her hgb A1C down to below 9% before going for a surgery. When she can achieve that then I would be happy to have her come in to talk about it again.

## 2020-08-01 NOTE — Telephone Encounter (Addendum)
I also noticed at her OV w PCP on 07/12/20 her BP was 188/110.  She had been out of her BP meds. She had labs with cardiologist on Monday and PCP thought meds might be changed. Was having chest pain when BP is elevated.  She is scheduled for a "home sleep test" on 08/05/20.

## 2020-08-01 NOTE — Telephone Encounter (Signed)
She talked to you about this in May.  Do you want her to come in again for pre op visit before I schedule?

## 2020-08-04 ENCOUNTER — Emergency Department (HOSPITAL_COMMUNITY): Payer: 59

## 2020-08-04 ENCOUNTER — Encounter (HOSPITAL_COMMUNITY)
Admission: EM | Disposition: A | Discharge status: Home / Self Care | Payer: Self-pay | Source: Home / Self Care | Attending: Cardiology

## 2020-08-04 ENCOUNTER — Inpatient Hospital Stay (HOSPITAL_COMMUNITY)
Admission: EM | Admit: 2020-08-04 | Discharge: 2020-08-06 | DRG: 246 | Disposition: A | Discharge status: Home / Self Care | Payer: 59 | Attending: Cardiology | Admitting: Cardiology

## 2020-08-04 ENCOUNTER — Encounter (HOSPITAL_COMMUNITY): Payer: Self-pay | Admitting: *Deleted

## 2020-08-04 ENCOUNTER — Other Ambulatory Visit: Payer: Self-pay

## 2020-08-04 DIAGNOSIS — I2121 ST elevation (STEMI) myocardial infarction involving left circumflex coronary artery: Secondary | ICD-10-CM | POA: Diagnosis present

## 2020-08-04 DIAGNOSIS — I25119 Atherosclerotic heart disease of native coronary artery with unspecified angina pectoris: Secondary | ICD-10-CM | POA: Diagnosis present

## 2020-08-04 DIAGNOSIS — N184 Chronic kidney disease, stage 4 (severe): Secondary | ICD-10-CM | POA: Diagnosis present

## 2020-08-04 DIAGNOSIS — Z8249 Family history of ischemic heart disease and other diseases of the circulatory system: Secondary | ICD-10-CM

## 2020-08-04 DIAGNOSIS — Z20822 Contact with and (suspected) exposure to covid-19: Secondary | ICD-10-CM | POA: Diagnosis present

## 2020-08-04 DIAGNOSIS — N179 Acute kidney failure, unspecified: Secondary | ICD-10-CM | POA: Diagnosis present

## 2020-08-04 DIAGNOSIS — N1832 Chronic kidney disease, stage 3b: Secondary | ICD-10-CM | POA: Diagnosis present

## 2020-08-04 DIAGNOSIS — E66811 Obesity, class 1: Secondary | ICD-10-CM | POA: Diagnosis present

## 2020-08-04 DIAGNOSIS — I10 Essential (primary) hypertension: Secondary | ICD-10-CM | POA: Diagnosis not present

## 2020-08-04 DIAGNOSIS — I2510 Atherosclerotic heart disease of native coronary artery without angina pectoris: Secondary | ICD-10-CM

## 2020-08-04 DIAGNOSIS — Z6832 Body mass index (BMI) 32.0-32.9, adult: Secondary | ICD-10-CM | POA: Diagnosis not present

## 2020-08-04 DIAGNOSIS — E1165 Type 2 diabetes mellitus with hyperglycemia: Secondary | ICD-10-CM | POA: Diagnosis present

## 2020-08-04 DIAGNOSIS — E669 Obesity, unspecified: Secondary | ICD-10-CM | POA: Diagnosis present

## 2020-08-04 DIAGNOSIS — E119 Type 2 diabetes mellitus without complications: Secondary | ICD-10-CM | POA: Diagnosis not present

## 2020-08-04 DIAGNOSIS — Z7984 Long term (current) use of oral hypoglycemic drugs: Secondary | ICD-10-CM | POA: Diagnosis not present

## 2020-08-04 DIAGNOSIS — I2542 Coronary artery dissection: Secondary | ICD-10-CM | POA: Diagnosis present

## 2020-08-04 DIAGNOSIS — E782 Mixed hyperlipidemia: Secondary | ICD-10-CM | POA: Diagnosis present

## 2020-08-04 DIAGNOSIS — Z955 Presence of coronary angioplasty implant and graft: Secondary | ICD-10-CM

## 2020-08-04 DIAGNOSIS — Z7982 Long term (current) use of aspirin: Secondary | ICD-10-CM | POA: Diagnosis not present

## 2020-08-04 DIAGNOSIS — I129 Hypertensive chronic kidney disease with stage 1 through stage 4 chronic kidney disease, or unspecified chronic kidney disease: Secondary | ICD-10-CM | POA: Diagnosis present

## 2020-08-04 DIAGNOSIS — I213 ST elevation (STEMI) myocardial infarction of unspecified site: Secondary | ICD-10-CM

## 2020-08-04 DIAGNOSIS — Z79899 Other long term (current) drug therapy: Secondary | ICD-10-CM

## 2020-08-04 DIAGNOSIS — I2119 ST elevation (STEMI) myocardial infarction involving other coronary artery of inferior wall: Secondary | ICD-10-CM | POA: Diagnosis present

## 2020-08-04 DIAGNOSIS — E785 Hyperlipidemia, unspecified: Secondary | ICD-10-CM | POA: Diagnosis present

## 2020-08-04 DIAGNOSIS — I340 Nonrheumatic mitral (valve) insufficiency: Secondary | ICD-10-CM | POA: Diagnosis not present

## 2020-08-04 DIAGNOSIS — E1122 Type 2 diabetes mellitus with diabetic chronic kidney disease: Secondary | ICD-10-CM | POA: Diagnosis present

## 2020-08-04 DIAGNOSIS — E1121 Type 2 diabetes mellitus with diabetic nephropathy: Secondary | ICD-10-CM | POA: Diagnosis present

## 2020-08-04 HISTORY — PX: LEFT HEART CATH AND CORONARY ANGIOGRAPHY: CATH118249

## 2020-08-04 HISTORY — PX: CORONARY/GRAFT ACUTE MI REVASCULARIZATION: CATH118305

## 2020-08-04 LAB — CBC WITH DIFFERENTIAL/PLATELET
Abs Immature Granulocytes: 0.08 10*3/uL — ABNORMAL HIGH (ref 0.00–0.07)
Basophils Absolute: 0 10*3/uL (ref 0.0–0.1)
Basophils Relative: 0 %
Eosinophils Absolute: 0 10*3/uL (ref 0.0–0.5)
Eosinophils Relative: 0 %
HCT: 35.1 % — ABNORMAL LOW (ref 36.0–46.0)
Hemoglobin: 10.6 g/dL — ABNORMAL LOW (ref 12.0–15.0)
Immature Granulocytes: 1 %
Lymphocytes Relative: 9 %
Lymphs Abs: 1.3 10*3/uL (ref 0.7–4.0)
MCH: 26.6 pg (ref 26.0–34.0)
MCHC: 30.2 g/dL (ref 30.0–36.0)
MCV: 88.2 fL (ref 80.0–100.0)
Monocytes Absolute: 0.3 10*3/uL (ref 0.1–1.0)
Monocytes Relative: 2 %
Neutro Abs: 13.3 10*3/uL — ABNORMAL HIGH (ref 1.7–7.7)
Neutrophils Relative %: 88 %
Platelets: 316 10*3/uL (ref 150–400)
RBC: 3.98 MIL/uL (ref 3.87–5.11)
RDW: 14.2 % (ref 11.5–15.5)
WBC: 15 10*3/uL — ABNORMAL HIGH (ref 4.0–10.5)
nRBC: 0.1 % (ref 0.0–0.2)

## 2020-08-04 LAB — CBC
HCT: 34.3 % — ABNORMAL LOW (ref 36.0–46.0)
Hemoglobin: 10.9 g/dL — ABNORMAL LOW (ref 12.0–15.0)
MCH: 26.4 pg (ref 26.0–34.0)
MCHC: 31.8 g/dL (ref 30.0–36.0)
MCV: 83.1 fL (ref 80.0–100.0)
Platelets: 295 10*3/uL (ref 150–400)
RBC: 4.13 MIL/uL (ref 3.87–5.11)
RDW: 14 % (ref 11.5–15.5)
WBC: 12 10*3/uL — ABNORMAL HIGH (ref 4.0–10.5)
nRBC: 0 % (ref 0.0–0.2)

## 2020-08-04 LAB — TROPONIN I (HIGH SENSITIVITY)
Troponin I (High Sensitivity): 238 ng/L (ref <18)
Troponin I (High Sensitivity): >27000 ng/L (ref <18)

## 2020-08-04 LAB — RESP PANEL BY RT-PCR (FLU A&B, COVID) ARPGX2
Influenza A by PCR: NEGATIVE
Influenza B by PCR: NEGATIVE
SARS Coronavirus 2 by RT PCR: NEGATIVE

## 2020-08-04 LAB — LIPID PANEL
Cholesterol: 347 mg/dL — ABNORMAL HIGH (ref 0–200)
HDL: 39 mg/dL — ABNORMAL LOW (ref >40)
LDL Cholesterol: UNDETERMINED mg/dL (ref 0–99)
Total CHOL/HDL Ratio: 8.9 RATIO
Triglycerides: 425 mg/dL — ABNORMAL HIGH (ref <150)
VLDL: UNDETERMINED mg/dL (ref 0–40)

## 2020-08-04 LAB — MRSA PCR SCREENING: MRSA by PCR: NEGATIVE

## 2020-08-04 LAB — I-STAT BETA HCG BLOOD, ED (MC, WL, AP ONLY): I-stat hCG, quantitative: <5 m[IU]/mL (ref <5)

## 2020-08-04 LAB — COMPREHENSIVE METABOLIC PANEL
ALT: 17 U/L (ref 0–44)
AST: 22 U/L (ref 15–41)
Albumin: 3.5 g/dL (ref 3.5–5.0)
Alkaline Phosphatase: 41 U/L (ref 38–126)
Anion gap: 11 (ref 5–15)
BUN: 21 mg/dL — ABNORMAL HIGH (ref 6–20)
CO2: 22 mmol/L (ref 22–32)
Calcium: 9.2 mg/dL (ref 8.9–10.3)
Chloride: 100 mmol/L (ref 98–111)
Creatinine, Ser: 2.06 mg/dL — ABNORMAL HIGH (ref 0.44–1.00)
GFR, Estimated: 29 mL/min — ABNORMAL LOW (ref >60)
Glucose, Bld: 258 mg/dL — ABNORMAL HIGH (ref 70–99)
Potassium: 4 mmol/L (ref 3.5–5.1)
Sodium: 133 mmol/L — ABNORMAL LOW (ref 135–145)
Total Bilirubin: 0.6 mg/dL (ref 0.3–1.2)
Total Protein: 7.7 g/dL (ref 6.5–8.1)

## 2020-08-04 LAB — BASIC METABOLIC PANEL
Anion gap: 13 (ref 5–15)
BUN: 21 mg/dL — ABNORMAL HIGH (ref 6–20)
CO2: 18 mmol/L — ABNORMAL LOW (ref 22–32)
Calcium: 8.8 mg/dL — ABNORMAL LOW (ref 8.9–10.3)
Chloride: 101 mmol/L (ref 98–111)
Creatinine, Ser: 2.01 mg/dL — ABNORMAL HIGH (ref 0.44–1.00)
GFR, Estimated: 30 mL/min — ABNORMAL LOW (ref >60)
Glucose, Bld: 265 mg/dL — ABNORMAL HIGH (ref 70–99)
Potassium: 4.4 mmol/L (ref 3.5–5.1)
Sodium: 132 mmol/L — ABNORMAL LOW (ref 135–145)

## 2020-08-04 LAB — GLUCOSE, CAPILLARY
Glucose-Capillary: 246 mg/dL — ABNORMAL HIGH (ref 70–99)
Glucose-Capillary: 199 mg/dL — ABNORMAL HIGH (ref 70–99)
Glucose-Capillary: 153 mg/dL — ABNORMAL HIGH (ref 70–99)
Glucose-Capillary: 141 mg/dL — ABNORMAL HIGH (ref 70–99)
Glucose-Capillary: 190 mg/dL — ABNORMAL HIGH (ref 70–99)

## 2020-08-04 LAB — HEMOGLOBIN A1C
Hgb A1c MFr Bld: 9.9 % — ABNORMAL HIGH (ref 4.8–5.6)
Mean Plasma Glucose: 237.43 mg/dL

## 2020-08-04 LAB — LDL CHOLESTEROL, DIRECT: Direct LDL: 206.7 mg/dL — ABNORMAL HIGH (ref 0–99)

## 2020-08-04 LAB — PROTIME-INR
INR: 1.1 (ref 0.8–1.2)
Prothrombin Time: 13.8 seconds (ref 11.4–15.2)

## 2020-08-04 LAB — MAGNESIUM: Magnesium: 1.8 mg/dL (ref 1.7–2.4)

## 2020-08-04 LAB — APTT: aPTT: >200 seconds (ref 24–36)

## 2020-08-04 SURGERY — CORONARY/GRAFT ACUTE MI REVASCULARIZATION
Anesthesia: LOCAL

## 2020-08-04 MED ORDER — LIDOCAINE HCL (PF) 1 % IJ SOLN
INTRAMUSCULAR | Status: AC
  Filled 2020-08-04: qty 30

## 2020-08-04 MED ORDER — VERAPAMIL HCL 2.5 MG/ML IV SOLN
INTRAVENOUS | Status: DC | PRN
  Administered 2020-08-04: 10 mL via INTRA_ARTERIAL

## 2020-08-04 MED ORDER — NITROGLYCERIN IN D5W 200-5 MCG/ML-% IV SOLN
INTRAVENOUS | Status: AC
  Filled 2020-08-04: qty 250

## 2020-08-04 MED ORDER — HEPARIN SODIUM (PORCINE) 1000 UNIT/ML IJ SOLN
INTRAMUSCULAR | Status: DC | PRN
  Administered 2020-08-04: 8000 [IU] via INTRAVENOUS

## 2020-08-04 MED ORDER — NITROGLYCERIN IN D5W 200-5 MCG/ML-% IV SOLN
INTRAVENOUS | Status: DC | PRN
  Administered 2020-08-04: 10 ug/min via INTRAVENOUS

## 2020-08-04 MED ORDER — HEPARIN (PORCINE) IN NACL 1000-0.9 UT/500ML-% IV SOLN
INTRAVENOUS | Status: DC | PRN
  Administered 2020-08-04 (×2): 500 mL

## 2020-08-04 MED ORDER — CHLORHEXIDINE GLUCONATE CLOTH 2 % EX PADS
6.0000 | MEDICATED_PAD | Freq: Every day | CUTANEOUS | Status: DC
  Administered 2020-08-05 – 2020-08-06 (×2): 6 via TOPICAL

## 2020-08-04 MED ORDER — TICAGRELOR 90 MG PO TABS
90.0000 mg | ORAL_TABLET | Freq: Two times a day (BID) | ORAL | Status: DC
  Administered 2020-08-04 – 2020-08-06 (×5): 90 mg via ORAL
  Filled 2020-08-04 (×5): qty 1

## 2020-08-04 MED ORDER — INSULIN ASPART 100 UNIT/ML ~~LOC~~ SOLN: 0.0000 [IU] | Freq: Every day | SUBCUTANEOUS | Status: DC

## 2020-08-04 MED ORDER — ASPIRIN EC 81 MG PO TBEC
81.0000 mg | DELAYED_RELEASE_TABLET | Freq: Every day | ORAL | Status: DC
  Administered 2020-08-04 – 2020-08-06 (×3): 81 mg via ORAL
  Filled 2020-08-04 (×3): qty 1

## 2020-08-04 MED ORDER — ROSUVASTATIN CALCIUM 20 MG PO TABS
20.0000 mg | ORAL_TABLET | Freq: Every day | ORAL | Status: DC
  Administered 2020-08-04 – 2020-08-06 (×3): 20 mg via ORAL
  Filled 2020-08-04 (×3): qty 1

## 2020-08-04 MED ORDER — SODIUM CHLORIDE 0.9% FLUSH: 3.0000 mL | INTRAVENOUS | Status: DC | PRN

## 2020-08-04 MED ORDER — TICAGRELOR 90 MG PO TABS
ORAL_TABLET | ORAL | Status: AC
  Filled 2020-08-04: qty 2

## 2020-08-04 MED ORDER — EMPAGLIFLOZIN 10 MG PO TABS
10.0000 mg | ORAL_TABLET | Freq: Every day | ORAL | Status: DC
  Administered 2020-08-04 – 2020-08-06 (×3): 10 mg via ORAL
  Filled 2020-08-04 (×4): qty 1

## 2020-08-04 MED ORDER — CARVEDILOL 12.5 MG PO TABS
12.5000 mg | ORAL_TABLET | Freq: Two times a day (BID) | ORAL | Status: DC
  Administered 2020-08-04 – 2020-08-05 (×3): 12.5 mg via ORAL
  Filled 2020-08-04 (×3): qty 1

## 2020-08-04 MED ORDER — SODIUM CHLORIDE 0.9 % IV SOLN: 250.0000 mL | INTRAVENOUS | Status: DC | PRN

## 2020-08-04 MED ORDER — SODIUM CHLORIDE 0.9 % IV SOLN: INTRAVENOUS | Status: DC

## 2020-08-04 MED ORDER — ASPIRIN 81 MG PO CHEW: 324.0000 mg | CHEWABLE_TABLET | Freq: Once | ORAL | Status: DC

## 2020-08-04 MED ORDER — TICAGRELOR 90 MG PO TABS
ORAL_TABLET | ORAL | Status: DC | PRN
  Administered 2020-08-04: 180 mg via ORAL

## 2020-08-04 MED ORDER — ENOXAPARIN SODIUM 40 MG/0.4ML ~~LOC~~ SOLN
40.0000 mg | SUBCUTANEOUS | Status: DC
  Administered 2020-08-05: 40 mg via SUBCUTANEOUS
  Filled 2020-08-04: qty 0.4

## 2020-08-04 MED ORDER — LABETALOL HCL 5 MG/ML IV SOLN: 10.0000 mg | INTRAVENOUS | Status: AC | PRN

## 2020-08-04 MED ORDER — HYDRALAZINE HCL 20 MG/ML IJ SOLN: 10.0000 mg | INTRAMUSCULAR | Status: DC | PRN

## 2020-08-04 MED ORDER — SODIUM CHLORIDE 0.9 % WEIGHT BASED INFUSION
1.0000 mL/kg/h | INTRAVENOUS | Status: AC
  Administered 2020-08-04: 1 mL/kg/h via INTRAVENOUS

## 2020-08-04 MED ORDER — HEPARIN SODIUM (PORCINE) 5000 UNIT/ML IJ SOLN
4000.0000 [IU] | Freq: Once | INTRAMUSCULAR | Status: AC
  Administered 2020-08-04: 5000 [IU] via INTRAVENOUS

## 2020-08-04 MED ORDER — INSULIN ASPART 100 UNIT/ML ~~LOC~~ SOLN
0.0000 [IU] | Freq: Three times a day (TID) | SUBCUTANEOUS | Status: DC
  Administered 2020-08-04: 2 [IU] via SUBCUTANEOUS
  Administered 2020-08-04 – 2020-08-05 (×3): 3 [IU] via SUBCUTANEOUS
  Administered 2020-08-05 – 2020-08-06 (×3): 2 [IU] via SUBCUTANEOUS

## 2020-08-04 MED ORDER — IOHEXOL 350 MG/ML SOLN
INTRAVENOUS | Status: AC
  Filled 2020-08-04: qty 1

## 2020-08-04 MED ORDER — HEPARIN (PORCINE) IN NACL 1000-0.9 UT/500ML-% IV SOLN
INTRAVENOUS | Status: AC
  Filled 2020-08-04: qty 1000

## 2020-08-04 MED ORDER — IOHEXOL 350 MG/ML SOLN
INTRAVENOUS | Status: DC | PRN
  Administered 2020-08-04: 140 mL

## 2020-08-04 MED ORDER — ACETAMINOPHEN 325 MG PO TABS
650.0000 mg | ORAL_TABLET | ORAL | Status: DC | PRN
  Administered 2020-08-04 – 2020-08-05 (×2): 650 mg via ORAL
  Filled 2020-08-04 (×3): qty 2

## 2020-08-04 MED ORDER — SODIUM CHLORIDE 0.9% FLUSH
3.0000 mL | Freq: Two times a day (BID) | INTRAVENOUS | Status: DC
  Administered 2020-08-04 – 2020-08-06 (×4): 3 mL via INTRAVENOUS

## 2020-08-04 MED ORDER — NITROGLYCERIN 1 MG/10 ML FOR IR/CATH LAB
INTRA_ARTERIAL | Status: DC | PRN
  Administered 2020-08-04 (×2): 200 ug via INTRACORONARY

## 2020-08-04 MED ORDER — NITROGLYCERIN IN D5W 200-5 MCG/ML-% IV SOLN
0.0000 ug/min | INTRAVENOUS | Status: DC
  Administered 2020-08-04: 10 ug/min via INTRAVENOUS
  Administered 2020-08-05: 65 ug/min via INTRAVENOUS
  Filled 2020-08-04: qty 250

## 2020-08-04 MED ORDER — NITROGLYCERIN 1 MG/10 ML FOR IR/CATH LAB
INTRA_ARTERIAL | Status: AC
  Filled 2020-08-04: qty 10

## 2020-08-04 MED ORDER — IRBESARTAN 75 MG PO TABS
75.0000 mg | ORAL_TABLET | Freq: Every day | ORAL | Status: DC
  Administered 2020-08-04: 75 mg via ORAL
  Filled 2020-08-04 (×3): qty 1

## 2020-08-04 MED ORDER — ONDANSETRON HCL 4 MG/2ML IJ SOLN: 4.0000 mg | Freq: Four times a day (QID) | INTRAMUSCULAR | Status: DC | PRN

## 2020-08-04 SURGICAL SUPPLY — 19 items
BALLN SAPPHIRE 2.0X12 (BALLOONS) ×2
BALLN SAPPHIRE ~~LOC~~ 3.75X10 (BALLOONS) ×2 IMPLANT
BALLOON SAPPHIRE 2.0X12 (BALLOONS) ×1 IMPLANT
CATH INFINITI 5FR ANG PIGTAIL (CATHETERS) ×2 IMPLANT
CATH VISTA GUIDE 6FR JR4 (CATHETERS) ×2 IMPLANT
CATH VISTA GUIDE 6FR XBLAD3.5 (CATHETERS) ×2 IMPLANT
DEVICE RAD COMP TR BAND LRG (VASCULAR PRODUCTS) ×2 IMPLANT
GLIDESHEATH SLEND SS 6F .021 (SHEATH) ×2 IMPLANT
GUIDEWIRE INQWIRE 1.5J.035X260 (WIRE) ×1 IMPLANT
INQWIRE 1.5J .035X260CM (WIRE) ×2
KIT ENCORE 26 ADVANTAGE (KITS) ×2 IMPLANT
KIT HEART LEFT (KITS) ×2 IMPLANT
PACK CARDIAC CATHETERIZATION (CUSTOM PROCEDURE TRAY) ×2 IMPLANT
SHEATH PINNACLE 6F 10CM (SHEATH) ×2 IMPLANT
SHEATH PROBE COVER 6X72 (BAG) ×2 IMPLANT
STENT RESOLUTE ONYX 3.5X18 (Permanent Stent) ×4 IMPLANT
TRANSDUCER W/STOPCOCK (MISCELLANEOUS) ×2 IMPLANT
TUBING CIL FLEX 10 FLL-RA (TUBING) ×2 IMPLANT
WIRE ASAHI PROWATER 180CM (WIRE) ×2 IMPLANT

## 2020-08-04 NOTE — ED Triage Notes (Signed)
Pt arrives via [facility] from home with c/o cp. Cp started about 7:30 tonight. Unable to obtain IV access en route. Pt vomited x 1 prior to ems arrival. Pt given 21morphine intranasal, cbg 230, 324 asa and 1 nitro.

## 2020-08-04 NOTE — Progress Notes (Addendum)
Patient Name: Casey Acosta   Patient Profile:   32H with resistant HTN, HLD, and DM2 who presented with inferolateral STEMI. Cath Mod 3 V CAD  LADp:60;LADm:50;D1:80;RCAp:80;LPVA/LPL 80-90% and   CXm:100>>stent x 2 >>0     SUBJECTIVE feels better no chest pain   Past Medical History:  Diagnosis Date  . Anemia   . Anxiety   . Anxiety and depression   . Chronic kidney disease   . Colon polyp   . Diabetes mellitus without complication ([place])   . Dysuria   . High risk sexual behavior   . History of syncope   . Hyperlipidemia   . Hypertension   . Internal hemorrhoids   . LVH (left ventricular hypertrophy)   . Malaise and fatigue   . Obesity (BMI 30-39.9)   . Ovarian mass   . Panic disorder   . PCOS (polycystic ovarian syndrome)   . Rectal bleeding   . Shortness of breath   . Surgical menopause   . Vaginal trichomoniasis     Scheduled Meds:  Scheduled Meds: . aspirin EC  81 mg Oral Daily  . carvedilol  12.5 mg Oral BID WC  . empagliflozin  10 mg Oral Daily  . [START ON 08/05/2020] enoxaparin (LOVENOX) injection  40 mg Subcutaneous Q24H  . insulin aspart  0-15 Units Subcutaneous TID WC  . insulin aspart  0-5 Units Subcutaneous QHS  . irbesartan  75 mg Oral Daily  . rosuvastatin  20 mg Oral Daily  . sodium chloride flush  3 mL Intravenous Q12H  . ticagrelor  90 mg Oral BID   Continuous Infusions: . sodium chloride 89.9 mL/hr at 08/04/20 0700  . sodium chloride    . sodium chloride 1 mL/kg/hr (08/04/20 0354)  . nitroGLYCERIN 10 mcg/min (08/04/20 0700)   sodium chloride, acetaminophen, labetalol, ondansetron (ZOFRAN) IV, sodium chloride flush    PHYSICAL EXAM Vitals:   08/04/20 0615 08/04/20 0630 08/04/20 0645 08/04/20 0700  BP: 123/86 (!) 137/93 (!) 132/91 (!) 126/93  Pulse: 65 71 64 69  Resp: 14 13 13 13   Temp:    98.4 F (36.9 C)  TempSrc:    Oral  SpO2: 98% 100% 99% 99%  Weight:      Height:       BP (!) 159/100 (BP Location: Left Arm)    Pulse 66   Temp 98.4 F (36.9 C) (Oral)   Resp 13   Ht 5' 5.98" (1.676 m)   Wt 89.9 kg   SpO2 99%   BMI 32.00 kg/m  Well developed and nourished in no acute distress HENT normal Neck supple with JVP-  Flat  Clear Regular rate and rhythm, no murmurs or gallops R femoral sheath in place  Abd-soft with active BS No Clubbing cyanosis edema Skin-warm and dry A & Oriented  Grossly normal sensory and motor function    TELEMETRY: Reviewed personnally pt in sinus:     Intake/Output Summary (Last 24 hours) at 08/04/2020 0807 Last data filed at 08/04/2020 0700 Gross per 24 hour  Intake 399.33 ml  Output --  Net 399.33 ml    LABS: Basic Metabolic Panel: Recent Labs  Lab 08/04/20 0109 08/04/20 0302  NA 133* 132*  K 4.0 4.4  CL 100 101  CO2 22 18*  GLUCOSE 258* 265*  BUN 21* 21*  CREATININE 2.06* 2.01*  CALCIUM 9.2 8.8*  MG  --  1.8   Cardiac Enzymes: No results for input(s): CKTOTAL, CKMB,  CKMBINDEX, TROPONINI in the last 72 hours. CBC: Recent Labs  Lab 08/04/20 0109 08/04/20 0302  WBC 15.0* 12.0*  NEUTROABS 13.3*  --   HGB 10.6* 10.9*  HCT 35.1* 34.3*  MCV 88.2 83.1  PLT 316 295   PROTIME: Recent Labs    08/04/20 0302  LABPROT 13.8  INR 1.1   Liver Function Tests: Recent Labs    08/04/20 0109  AST 22  ALT 17  ALKPHOS 41  BILITOT 0.6  PROT 7.7  ALBUMIN 3.5   No results for input(s): LIPASE, AMYLASE in the last 72 hours. BNP: BNP (last 3 results) Recent Labs    05/24/20 0000  BNP 166.1*    ProBNP (last 3 results) No results for input(s): PROBNP in the last 8760 hours.  D-Dimer: No results for input(s): DDIMER in the last 72 hours. Hemoglobin A1C: Recent Labs    08/04/20 0109  HGBA1C 9.9*   Fasting Lipid Panel: Recent Labs    08/04/20 0109  CHOL 347*  HDL 39*  LDLCALC UNABLE TO CALCULATE IF TRIGLYCERIDE OVER 400 mg/dL  TRIG 425*  CHOLHDL 8.9  LDLDIRECT 206.7*   Thyroid Function Tests: No results for input(s): TSH,  T4TOTAL, T3FREE, THYROIDAB in the last 72 hours.  Invalid input(s): FREET3 Anemia Panel: No results for input(s): VITAMINB12, FOLATE, FERRITIN, TIBC, IRON, RETICCTPCT in the last 72 hours.       ASSESSMENT AND PLAN:  Active Problems:   Essential hypertension   Uncontrolled type 2 diabetes mellitus with diabetic nephropathy, without long-term current use of insulin (HCC)   Hyperlipidemia   Chronic kidney disease, stage 3b (HCC)   Class 1 obesity   Acute ST elevation myocardial infarction (STEMI) due to occlusion of circumflex coronary artery (HCC)   STEMI involving left circumflex coronary artery ([place])   continue DAPT  Irbesartan and carvedilol>> BP better At high risk for Contrast Nephropathy  Follow Cr  Remove femoral sheath now with IV access    Signed, Virl Axe MD  08/04/2020

## 2020-08-04 NOTE — Progress Notes (Signed)
Femoral Sheath removal  Site: Right Femoral   Meds in Cath: Heparin, Brilinta, Nitro drip   Site Level: Lvl 1 prior, oozing around side without hematoma. Lvl 0 after pull. Reviewed with bedside nurse  ACT prior to pull: 149   Labs as follows:  Results for orders placed or performed during the hospital encounter of 08/04/20 (from the past 24 hour(s))  Resp Panel by RT-PCR (Flu A&B, Covid) Nasopharyngeal Swab     Status: None   Collection Time: 08/04/20  1:03 AM   Specimen: Nasopharyngeal Swab; Nasopharyngeal(NP) swabs in vial transport medium  Result Value Ref Range   SARS Coronavirus 2 by RT PCR NEGATIVE NEGATIVE   Influenza A by PCR NEGATIVE NEGATIVE   Influenza B by PCR NEGATIVE NEGATIVE  Hemoglobin A1c     Status: Abnormal   Collection Time: 08/04/20  1:09 AM  Result Value Ref Range   Hgb A1c MFr Bld 9.9 (H) 4.8 - 5.6 %   Mean Plasma Glucose 237.43 mg/dL  CBC with Differential/Platelet     Status: Abnormal   Collection Time: 08/04/20  1:09 AM  Result Value Ref Range   WBC 15.0 (H) 4.0 - 10.5 K/uL   RBC 3.98 3.87 - 5.11 MIL/uL   Hemoglobin 10.6 (L) 12.0 - 15.0 g/dL   HCT 35.1 (L) 36 - 46 %   MCV 88.2 80.0 - 100.0 fL   MCH 26.6 26.0 - 34.0 pg   MCHC 30.2 30.0 - 36.0 g/dL   RDW 14.2 11.5 - 15.5 %   Platelets 316 150 - 400 K/uL   nRBC 0.1 0.0 - 0.2 %   Neutrophils Relative % 88 %   Neutro Abs 13.3 (H) 1.7 - 7.7 K/uL   Lymphocytes Relative 9 %   Lymphs Abs 1.3 0.7 - 4.0 K/uL   Monocytes Relative 2 %   Monocytes Absolute 0.3 0.1 - 1.0 K/uL   Eosinophils Relative 0 %   Eosinophils Absolute 0.0 0.0 - 0.5 K/uL   Basophils Relative 0 %   Basophils Absolute 0.0 0.0 - 0.1 K/uL   Immature Granulocytes 1 %   Abs Immature Granulocytes 0.08 (H) 0.00 - 0.07 K/uL  Comprehensive metabolic panel     Status: Abnormal   Collection Time: 08/04/20  1:09 AM  Result Value Ref Range   Sodium 133 (L) 135 - 145 mmol/L   Potassium 4.0 3.5 - 5.1 mmol/L   Chloride 100 98 - 111 mmol/L    CO2 22 22 - 32 mmol/L   Glucose, Bld 258 (H) 70 - 99 mg/dL   BUN 21 (H) 6 - 20 mg/dL   Creatinine, Ser 2.06 (H) 0.44 - 1.00 mg/dL   Calcium 9.2 8.9 - 10.3 mg/dL   Total Protein 7.7 6.5 - 8.1 g/dL   Albumin 3.5 3.5 - 5.0 g/dL   AST 22 15 - 41 U/L   ALT 17 0 - 44 U/L   Alkaline Phosphatase 41 38 - 126 U/L   Total Bilirubin 0.6 0.3 - 1.2 mg/dL   GFR, Estimated 29 (L) >60 mL/min   Anion gap 11 5 - 15  Troponin I (High Sensitivity)     Status: Abnormal   Collection Time: 08/04/20  1:09 AM  Result Value Ref Range   Troponin I (High Sensitivity) 238 (HH) <18 ng/L  Lipid panel     Status: Abnormal   Collection Time: 08/04/20  1:09 AM  Result Value Ref Range   Cholesterol 347 (H) 0 - 200 mg/dL  Triglycerides 425 (H) <150 mg/dL   HDL 39 (L) >40 mg/dL   Total CHOL/HDL Ratio 8.9 RATIO   VLDL UNABLE TO CALCULATE IF TRIGLYCERIDE OVER 400 mg/dL 0 - 40 mg/dL   LDL Cholesterol UNABLE TO CALCULATE IF TRIGLYCERIDE OVER 400 mg/dL 0 - 99 mg/dL  LDL cholesterol, direct     Status: Abnormal   Collection Time: 08/04/20  1:09 AM  Result Value Ref Range   Direct LDL 206.7 (H) 0 - 99 mg/dL  I-Stat beta hCG blood, ED     Status: None   Collection Time: 08/04/20  1:11 AM  Result Value Ref Range   I-stat hCG, quantitative <5.0 <5 mIU/mL   Comment 3          Glucose, capillary     Status: Abnormal   Collection Time: 08/04/20  2:46 AM  Result Value Ref Range   Glucose-Capillary 246 (H) 70 - 99 mg/dL  MRSA PCR Screening     Status: None   Collection Time: 08/04/20  2:47 AM   Specimen: Nasopharyngeal  Result Value Ref Range   MRSA by PCR NEGATIVE NEGATIVE  Protime-INR     Status: None   Collection Time: 08/04/20  3:02 AM  Result Value Ref Range   Prothrombin Time 13.8 11.4 - 15.2 seconds   INR 1.1 0.8 - 1.2  APTT     Status: Abnormal   Collection Time: 08/04/20  3:02 AM  Result Value Ref Range   aPTT >200 (HH) 24 - 36 seconds  Basic metabolic panel     Status: Abnormal   Collection Time:  08/04/20  3:02 AM  Result Value Ref Range   Sodium 132 (L) 135 - 145 mmol/L   Potassium 4.4 3.5 - 5.1 mmol/L   Chloride 101 98 - 111 mmol/L   CO2 18 (L) 22 - 32 mmol/L   Glucose, Bld 265 (H) 70 - 99 mg/dL   BUN 21 (H) 6 - 20 mg/dL   Creatinine, Ser 2.01 (H) 0.44 - 1.00 mg/dL   Calcium 8.8 (L) 8.9 - 10.3 mg/dL   GFR, Estimated 30 (L) >60 mL/min   Anion gap 13 5 - 15  Magnesium     Status: None   Collection Time: 08/04/20  3:02 AM  Result Value Ref Range   Magnesium 1.8 1.7 - 2.4 mg/dL  Troponin I (High Sensitivity)     Status: Abnormal   Collection Time: 08/04/20  3:02 AM  Result Value Ref Range   Troponin I (High Sensitivity) >27,000 (HH) <18 ng/L  CBC     Status: Abnormal   Collection Time: 08/04/20  3:02 AM  Result Value Ref Range   WBC 12.0 (H) 4.0 - 10.5 K/uL   RBC 4.13 3.87 - 5.11 MIL/uL   Hemoglobin 10.9 (L) 12.0 - 15.0 g/dL   HCT 34.3 (L) 36 - 46 %   MCV 83.1 80.0 - 100.0 fL   MCH 26.4 26.0 - 34.0 pg   MCHC 31.8 30.0 - 36.0 g/dL   RDW 14.0 11.5 - 15.5 %   Platelets 295 150 - 400 K/uL   nRBC 0.0 0.0 - 0.2 %  Glucose, capillary     Status: Abnormal   Collection Time: 08/04/20  7:06 AM  Result Value Ref Range   Glucose-Capillary 199 (H) 70 - 99 mg/dL  Glucose, capillary     Status: Abnormal   Collection Time: 08/04/20 11:11 AM  Result Value Ref Range   Glucose-Capillary 153 (H) 70 - 99 mg/dL  Vitals at start: BP: 152/100   HR 72  RR 18  SpO2 98  Vitals at end: BP: 138/93   HR 71  RR 14  SpO2 99   Procedure:   Right femoral sheath removed at bedside. Pulses palpated prior to removal, suture cut and removed. Sheath removed and pressure was held for 20 minutes (10:30-10:50). Site palpated throughout to ensure no hematoma formation. Site dressed with gauze and Tegaderm post procedure.  Site shown to bedside nurse and palpated again.  Site remained at level 0 throughout the procedure and after dressing application. Pulses unchanged throughout, patient instructed on  bedrest for 4 hours and to alert staff if site begins to be painful or begins to bleed.

## 2020-08-04 NOTE — ED Provider Notes (Signed)
[facility] EMERGENCY DEPARTMENT Provider Note  CSN: [id number] Arrival date & time: 08/04/20 3220  Chief Complaint(s) Code STEMI  HPI Casey Acosta is a 49 y.o. female with a past medical history listed below who presents to the emergency department as a code STEMI.  Patient reports having worsening anginal symptoms over the past few weeks.  Tonight she began having persistent chest pressure right after dinner.  She is endorsing shortness of breath.  Patient also reports an episode of nausea and vomiting.  No recent fevers or infections.  No other physical complaints.  Brought in by EMS.  EKG concerning for inferior STEMI.  Remainder of history, ROS, and physical exam limited due to patient's condition (acuity of condition).   Level V Caveat.    HPI  Past Medical History Past Medical History:  Diagnosis Date  . Anemia   . Anxiety   . Anxiety and depression   . Chronic kidney disease   . Colon polyp   . Diabetes mellitus without complication ([place])   . Dysuria   . High risk sexual behavior   . History of syncope   . Hyperlipidemia   . Hypertension   . Internal hemorrhoids   . LVH (left ventricular hypertrophy)   . Malaise and fatigue   . Obesity (BMI 30-39.9)   . Ovarian mass   . Panic disorder   . PCOS (polycystic ovarian syndrome)   . Rectal bleeding   . Shortness of breath   . Surgical menopause   . Vaginal trichomoniasis    Patient Active Problem List   Diagnosis Date Noted  . Chest pain 05/24/2020  . Elevated troponin 05/24/2020  . Mixed diabetic hyperlipidemia associated with type 2 diabetes mellitus ([place]) 05/24/2020  . Preventative health care 01/02/2019  . Dyspnea 01/02/2019  . Class 1 obesity 11/15/2018  . Chronic kidney disease, stage 3b ([place]) 05/26/2018  . Essential hypertension 02/23/2018  . Uncontrolled type 2 diabetes mellitus with diabetic nephropathy, without long-term current use of insulin ([place]) 02/23/2018  .  Hyperlipidemia 02/23/2018  . Anxiety and depression 02/23/2018   Home Medication(s) Prior to Admission medications   Medication Sig Start Date End Date Taking? Authorizing Provider  acetaminophen (TYLENOL) 500 MG tablet Take 1,000 mg by mouth every 6 (six) hours as needed for mild pain, moderate pain, fever or headache.    [provider]  amLODipine (NORVASC) 10 MG tablet Take 1 tablet (10 mg total) by mouth daily. 07/12/20   Billie Ruddy, MD  aspirin EC 81 MG tablet Take 81 mg by mouth daily.    [provider]  blood glucose meter kit and supplies KIT Dispense based on patient and insurance preference. Use up to four times daily as directed. 09/20/18   Lance Sell, NP  carvedilol (COREG) 25 MG tablet Take 1 tablet (25 mg total) by mouth 2 (two) times daily with a meal. 05/28/20   Pokhrel, Laxman, MD  cloNIDine (CATAPRES - DOSED IN MG/24 HR) 0.1 mg/24hr patch Place 1 patch (0.1 mg total) onto the skin once a week. 06/27/20   Skeet Latch, MD  glimepiride (AMARYL) 1 MG tablet Take 2 tablets (2 mg total) by mouth daily with breakfast. 07/12/20   Billie Ruddy, MD  hydrALAZINE (APRESOLINE) 100 MG tablet Take 1 tablet (100 mg total) by mouth 4 (four) times daily. 05/28/20   Pokhrel, Corrie Mckusick, MD  hydrOXYzine (ATARAX/VISTARIL) 50 MG tablet TAKE ONE TABLET BY MOUTH THREE TIMES A DAY AS NEEDED 07/12/20  Billie Ruddy, MD  irbesartan (AVAPRO) 300 MG tablet Take 1 tablet (300 mg total) by mouth daily. 05/29/20   Pokhrel, Corrie Mckusick, MD  metFORMIN (GLUCOPHAGE) 500 MG tablet Take 1 tablet (500 mg total) by mouth daily with breakfast. 07/12/20   Billie Ruddy, MD  rosuvastatin (CRESTOR) 10 MG tablet Take 1 tablet (10 mg total) by mouth daily. 07/12/20   Billie Ruddy, MD  Semaglutide,0.25 or 0.5MG /DOS, (OZEMPIC, 0.25 OR 0.5 MG/DOSE,) 2 MG/1.5ML SOPN Inject 0.5 mg into the skin once a week. 10/02/19   Elayne Snare, MD  sertraline (ZOLOFT) 25 MG tablet Take 1 tablet (25 mg  total) by mouth daily. 07/12/20   Billie Ruddy, MD  spironolactone (ALDACTONE) 25 MG tablet TAKE 1/2 TABLET DAILY 06/27/20   Skeet Latch, MD                                                                                                                                    Past Surgical History Past Surgical History:  Procedure Laterality Date  . ABDOMINAL HYSTERECTOMY     partial  . APPENDECTOMY    . CHOLECYSTECTOMY    . TEE WITHOUT CARDIOVERSION N/A 05/27/2020   Procedure: TRANSESOPHAGEAL ECHOCARDIOGRAM (TEE);  Surgeon: Josue Hector, MD;  Location: [facility] ENDOSCOPY;  Service: Cardiovascular;  Laterality: N/A;  . TUBAL LIGATION     Family History Family History  Problem Relation Age of Onset  . Anxiety disorder Mother   . Hyperlipidemia Mother   . Depression Mother   . Hypertension Mother   . Diabetes Mother   . Thyroid disease Mother   . Hypertension Sister   . Diabetes Sister   . ALS Father   . Crohn's disease Brother   . Crohn's disease Sister     Social History Social History   Tobacco Use  . Smoking status: Never Smoker  . Smokeless tobacco: Never Used  Vaping Use  . Vaping Use: Never used  Substance Use Topics  . Alcohol use: Yes    Comment: Occas. wine  . Drug use: No   Allergies Dilaudid [hydromorphone] and Prozac [fluoxetine hcl]  Review of Systems Review of Systems All other systems are reviewed and are negative for acute change except as noted in the HPI  Physical Exam Vital Signs  I have reviewed the triage vital signs BP (!) 106/94   Pulse 72   Temp (!) 96.8 F (36 C) (Temporal)   Resp 14   SpO2 100%   Physical Exam Vitals reviewed.  Constitutional:      General: She is not in acute distress.    Appearance: She is well-developed. She is not diaphoretic.     Comments: In obvious discomfort  HENT:     Head: Normocephalic and atraumatic.     Nose: Nose normal.  Eyes:     General: No scleral icterus.       Right eye: No  discharge.         Left eye: No discharge.     Conjunctiva/sclera: Conjunctivae normal.     Pupils: Pupils are equal, round, and reactive to light.  Cardiovascular:     Rate and Rhythm: Normal rate and regular rhythm.     Heart sounds: No murmur heard.  No friction rub. No gallop.   Pulmonary:     Effort: Pulmonary effort is normal. No respiratory distress.     Breath sounds: Normal breath sounds. No stridor. No rales.  Abdominal:     General: There is no distension.     Palpations: Abdomen is soft.     Tenderness: There is no abdominal tenderness.  Musculoskeletal:        General: No tenderness.     Cervical back: Normal range of motion and neck supple.  Skin:    General: Skin is warm and dry.     Findings: No erythema or rash.  Neurological:     Mental Status: She is alert and oriented to person, place, and time.     ED Results and Treatments Labs (all labs ordered are listed, but only abnormal results are displayed) Labs Reviewed  RESP PANEL BY RT-PCR (FLU A&B, COVID) ARPGX2  HEMOGLOBIN A1C  CBC WITH DIFFERENTIAL/PLATELET  PROTIME-INR  APTT  COMPREHENSIVE METABOLIC PANEL  LIPID PANEL  I-STAT BETA HCG BLOOD, ED ([facility], [facility], [facility] ONLY)  TROPONIN I (HIGH SENSITIVITY)                                                                                                                         EKG  EKG Interpretation  Date/Time:  Sunday August 04 2020 00:58:20 EST Ventricular Rate:  78 PR Interval:    QRS Duration: 118 QT Interval:  406 QTC Calculation: 463 R Axis:   66 Text Interpretation: Sinus rhythm Short PR interval Nonspecific intraventricular conduction delay Inferior infarct, acute (RCA) Minimal ST elevation, anterior leads Lateral leads are also involved Probable RV involvement, suggest recording right precordial leads Baseline wander in lead(s) V1 >>> Acute MI <<< Confirmed by Merrily Pew [phone]) on 08/04/2020 1:02:10 AM      Radiology No results found.  Pertinent labs &  imaging results that were available during my care of the patient were reviewed by me and considered in my medical decision making (see chart for details).  Medications Ordered in ED Medications  0.9 %  sodium chloride infusion (has no administration in time range)  aspirin chewable tablet 324 mg (324 mg Oral Not Given 08/04/20 0104)  heparin injection 4,000 Units (5,000 Units Intravenous Given 08/04/20 0103)  Procedures .1-3 Lead EKG Interpretation Performed by: Fatima Blank, MD Authorized by: Fatima Blank, MD     Interpretation: normal     ECG rate:  80   ECG rate assessment: normal     Rhythm: sinus rhythm     Ectopy: none     Conduction: normal   .Critical Care Performed by: Fatima Blank, MD Authorized by: Fatima Blank, MD   Critical care provider statement:    Critical care time (minutes):  10   Critical care was necessary to treat or prevent imminent or life-threatening deterioration of the following conditions:  Cardiac failure   Critical care was time spent personally by me on the following activities:  Discussions with consultants, evaluation of patient's response to treatment, examination of patient, ordering and performing treatments and interventions, ordering and review of laboratory studies, ordering and review of radiographic studies, pulse oximetry, re-evaluation of patient's condition, obtaining history from patient or surrogate and review of old charts    (including critical care time)  Medical Decision Making / ED Course I have reviewed the nursing notes for this encounter and the patient's prior records (if available in EHR or on provided paperwork).   Sanae Willetts was evaluated in Emergency Department on 08/04/2020 for the symptoms described in the history of present illness. She was  evaluated in the context of the global COVID-19 pandemic, which necessitated consideration that the patient might be at risk for infection with the SARS-CoV-2 virus that causes COVID-19. Institutional protocols and algorithms that pertain to the evaluation of patients at risk for COVID-19 are in a state of rapid change based on information released by regulatory bodies including the CDC and federal and state organizations. These policies and algorithms were followed during the patient's care in the ED.  Code STEMI.  Cardiology at bedside.  Taken to the Cath Lab.      Final Clinical Impression(s) / ED Diagnoses Final diagnoses:  None      This chart was dictated using voice recognition software.  Despite best efforts to proofread,  errors can occur which can change the documentation meaning.   Fatima Blank, MD 08/04/20 [phone]

## 2020-08-04 NOTE — H&P (Signed)
Cardiology Admission History and Physical:   Patient ID: Casey Acosta MRN: [id number]; DOB: 12/15/1970   Admission date: 08/04/2020  Primary Care Provider: Billie Ruddy, MD [facility] Cardiologist: Kirk Ruths, MD  [facility] Electrophysiologist:  None   Chief Complaint: chest pain   Patient Profile:   36F with HTN, HLD, and DM2 who presented with inferolateral STEMI.  History of Present Illness:   Ms. Casey Acosta reports that she was having worsening anginal symptoms over the past few weeks which culminated with unrelenting chest pressure around 7:30 PM prior to admission.  She states that she has had frequent episodes of central chest pressure over the past 3 months however symptoms of normal increase in frequency and were both at rest and exertion although exertion she seemed to trigger more of her events.  She would often have chest pain and shortness of breath with minimal exertion but her symptoms always resolve with rest.  She had anginal medicine regularly with minutes.  She noticed that this was different today because her pain was much more severe although similar in character but was unrelenting.  She also had an episode of nausea and emesis which.V Not consistent with her prior anginal episodes.  She was evaluated by EMS and found to have significant elevations in 2, 3, aVF and some elevations in V4-V6.  She was immediately taken for emergent catheterization with PCI x2 to her mid LCx with prompt resolution of her symptoms.  Of note she unfortunately has been off of several of her hypertension medications including carvedilol and irbesartan.  She does admit that she sometimes is not taking medications for this time secondary to running out of her meds following hospital discharge without PCP follow-up.  She had no medications filled today prior to admission.  Past Medical History:  Diagnosis Date  . Anemia   . Anxiety   . Anxiety and depression   . Chronic kidney  disease   . Colon polyp   . Diabetes mellitus without complication ([place])   . Dysuria   . High risk sexual behavior   . History of syncope   . Hyperlipidemia   . Hypertension   . Internal hemorrhoids   . LVH (left ventricular hypertrophy)   . Malaise and fatigue   . Obesity (BMI 30-39.9)   . Ovarian mass   . Panic disorder   . PCOS (polycystic ovarian syndrome)   . Rectal bleeding   . Shortness of breath   . Surgical menopause   . Vaginal trichomoniasis    Past Surgical History:  Procedure Laterality Date  . ABDOMINAL HYSTERECTOMY     partial  . APPENDECTOMY    . CHOLECYSTECTOMY    . TEE WITHOUT CARDIOVERSION N/A 05/27/2020   Procedure: TRANSESOPHAGEAL ECHOCARDIOGRAM (TEE);  Surgeon: Josue Hector, MD;  Location: [facility] ENDOSCOPY;  Service: Cardiovascular;  Laterality: N/A;  . TUBAL LIGATION      Medications Prior to Admission: Prior to Admission medications   Medication Sig Start Date End Date Taking? Authorizing Provider  acetaminophen (TYLENOL) 500 MG tablet Take 1,000 mg by mouth every 6 (six) hours as needed for mild pain, moderate pain, fever or headache.   Yes [provider]  amLODipine (NORVASC) 10 MG tablet Take 1 tablet (10 mg total) by mouth daily. 07/12/20  Yes Billie Ruddy, MD  aspirin EC 81 MG tablet Take 81 mg by mouth daily.   Yes [provider]  blood glucose meter kit and supplies KIT Dispense based on  patient and insurance preference. Use up to four times daily as directed. 09/20/18  Yes Lance Sell, NP  carvedilol (COREG) 25 MG tablet Take 1 tablet (25 mg total) by mouth 2 (two) times daily with a meal. 05/28/20  Yes Pokhrel, Laxman, MD  cloNIDine (CATAPRES - DOSED IN MG/24 HR) 0.1 mg/24hr patch Place 1 patch (0.1 mg total) onto the skin once a week. 06/27/20  Yes Skeet Latch, MD  glimepiride (AMARYL) 1 MG tablet Take 2 tablets (2 mg total) by mouth daily with breakfast. 07/12/20  Yes Billie Ruddy, MD  hydrALAZINE  (APRESOLINE) 100 MG tablet Take 1 tablet (100 mg total) by mouth 4 (four) times daily. 05/28/20  Yes Pokhrel, Laxman, MD  hydrOXYzine (ATARAX/VISTARIL) 50 MG tablet TAKE ONE TABLET BY MOUTH THREE TIMES A DAY AS NEEDED 07/12/20  Yes Billie Ruddy, MD  irbesartan (AVAPRO) 300 MG tablet Take 1 tablet (300 mg total) by mouth daily. 05/29/20  Yes Pokhrel, Corrie Mckusick, MD  metFORMIN (GLUCOPHAGE) 500 MG tablet Take 1 tablet (500 mg total) by mouth daily with breakfast. 07/12/20  Yes Billie Ruddy, MD  rosuvastatin (CRESTOR) 10 MG tablet Take 1 tablet (10 mg total) by mouth daily. 07/12/20  Yes Billie Ruddy, MD  sertraline (ZOLOFT) 25 MG tablet Take 1 tablet (25 mg total) by mouth daily. 07/12/20  Yes Billie Ruddy, MD  spironolactone (ALDACTONE) 25 MG tablet TAKE 1/2 TABLET DAILY 06/27/20  Yes Skeet Latch, MD  Semaglutide,0.25 or 0.5MG/DOS, (OZEMPIC, 0.25 OR 0.5 MG/DOSE,) 2 MG/1.5ML SOPN Inject 0.5 mg into the skin once a week. 10/02/19   Elayne Snare, MD     Allergies:    Allergies  Allergen Reactions  . Dilaudid [Hydromorphone] Nausea And Vomiting  . Prozac [Fluoxetine Hcl]     hallucinations    Social History:   Social History   Socioeconomic History  . Marital status: Significant Other    Spouse name: Not on file  . Number of children: Not on file  . Years of education: Not on file  . Highest education level: Not on file  Occupational History  . Not on file  Tobacco Use  . Smoking status: Never Smoker  . Smokeless tobacco: Never Used  Vaping Use  . Vaping Use: Never used  Substance and Sexual Activity  . Alcohol use: Yes    Comment: Occas. wine  . Drug use: No  . Sexual activity: Yes    Birth control/protection: Surgical    Comment: HYST.-1st intercourse 49 yo-More than 5 partners  Other Topics Concern  . Not on file  Social History Narrative  . Not on file   Social Determinants of Health   Financial Resource Strain:   . Difficulty of Paying Living Expenses: Not  on file  Food Insecurity:   . Worried About Charity fundraiser in the Last Year: Not on file  . Ran Out of Food in the Last Year: Not on file  Transportation Needs:   . Lack of Transportation (Medical): Not on file  . Lack of Transportation (Non-Medical): Not on file  Physical Activity:   . Days of Exercise per Week: Not on file  . Minutes of Exercise per Session: Not on file  Stress:   . Feeling of Stress : Not on file  Social Connections:   . Frequency of Communication with Friends and Family: Not on file  . Frequency of Social Gatherings with Friends and Family: Not on file  . Attends Religious Services:  Not on file  . Active Member of Clubs or Organizations: Not on file  . Attends Archivist Meetings: Not on file  . Marital Status: Not on file  Intimate Partner Violence:   . Fear of Current or Ex-Partner: Not on file  . Emotionally Abused: Not on file  . Physically Abused: Not on file  . Sexually Abused: Not on file    Family History:   The patient's family history includes ALS in her father; Anxiety disorder in her mother; Crohn's disease in her brother and sister; Depression in her mother; Diabetes in her mother and sister; Hyperlipidemia in her mother; Hypertension in her mother and sister; Thyroid disease in her mother.    ROS:   Review of Systems: [y] = yes, [ ] = no       General: Weight gain [ ]; Weight loss [ ]; Anorexia [ ]; Fatigue [ ]; Fever [ ]; Chills [ ]; Weakness [ ]    Cardiac: Chest pain/pressure [x]; Resting SOB [ ]; Exertional SOB [x]; Orthopnea [ ]; Pedal Edema [ ]; Palpitations [ ]; Syncope [ ]; Presyncope [ ]; Paroxysmal nocturnal dyspnea [ ]    Pulmonary: Cough [ ]; Wheezing [ ]; Hemoptysis [ ]; Sputum [ ]; Snoring [ ]    GI: Vomiting [ ]; Dysphagia [ ]; Melena [ ]; Hematochezia [ ]; Heartburn [ ]; Abdominal pain [ ]; Constipation [ ]; Diarrhea [ ]; BRBPR [ ]    GU: Hematuria [ ]; Dysuria [ ]; Nocturia [ ]  Vascular: Pain in legs with  walking [ ]; Pain in feet with lying flat [ ]; Non-healing sores [ ]; Stroke [ ]; TIA [ ]; Slurred speech [ ];    Neuro: Headaches [ ]; Vertigo [ ]; Seizures [ ]; Paresthesias [ ];Blurred vision [ ]; Diplopia [ ]; Vision changes [ ]    Ortho/Skin: Arthritis [ ]; Joint pain [ ]; Muscle pain [ ]; Joint swelling [ ]; Back Pain [ ]; Rash [ ]    Psych: Depression [ ]; Anxiety [ ]    Heme: Bleeding problems [ ]; Clotting disorders [ ]; Anemia [ ]    Endocrine: Diabetes [ ]; Thyroid dysfunction [ ]   Physical Exam/Data:   Vitals:   08/04/20 0245 08/04/20 0300 08/04/20 0327 08/04/20 0328  BP:  131/82 131/82   Pulse:  70 68   Resp:  13 13   Temp:  99 F (37.2 C) 99 F (37.2 C)   TempSrc:  Oral Oral   SpO2:  99%    Weight: 89.9 kg  89.9 kg   Height:    5' 5.98" (1.676 m)   No intake or output data in the 24 hours ending 08/04/20 0351 Last 3 Weights 08/04/2020 08/04/2020 08/04/2020  Weight (lbs) 198 lb 3.1 oz 198 lb 3.1 oz 209 lb 7 oz  Weight (kg) 89.9 kg 89.9 kg 95 kg     Body mass index is 32 kg/m.  General:  Well nourished, well developed, in no acute distress HEENT: normal Lymph: no adenopathy Neck: no JVD Endocrine:  No thryomegaly Vascular: No carotid bruits; FA pulses 2+ bilaterally without bruits  Cardiac:  normal S1, S2; RRR; no murmur  Lungs:  clear to auscultation bilaterally, no wheezing, rhonchi or rales  Abd: soft, nontender, no hepatomegaly  Ext: no LE edema Musculoskeletal:  No deformities, BUE and BLE strength normal and equal Skin: warm and dry  Neuro:  CNs 2-12  intact, no focal abnormalities noted Psych:  Normal affect   EKG:  The ECG that was done and demonstrates inferolateral STEMI  Relevant CV Studies:  Coronary angiography Result date: 08/04/20  Prox LAD lesion is 60% stenosed.  Mid LAD lesion is 50% stenosed.  1st Diag lesion is 80% stenosed.  Ramus lesion is 50% stenosed.  Mid Cx to Dist Cx lesion is 100% stenosed.  Prox RCA to Mid RCA  lesion is 80% stenosed.  Dist RCA lesion is 50% stenosed.  RPDA lesion is 40% stenosed.  LPAV lesion is 80% stenosed.  3rd LPL lesion is 90% stenosed.  1st LPL lesion is 50% stenosed.  A drug-eluting stent was successfully placed using a STENT RESOLUTE ONYX 3.5X18.  Post intervention, there is a 0% residual stenosis.  LV end diastolic pressure is normal.   1. Diffusely diseased diabetic coronary arteries with acute occlusion of the mid to distal LCx. This is a codominant vessel. Otherwise moderate 3 vessel disease. 2. Normal LVEDP 3. Successful PCI of the mid LCx with overlapping DES x 2  Plan: DAPT for at least one year. Aggressive risk factor modification and antianginal therapy. At this point would treat her residual disease medically. If she should have refractory angina in the future would need to be considered for CABG.   TTE  Result date: 05/24/20 1. There is a small mass (0.6 cm x 0.6 cm) attached to the [place] or LCC of  the aortic valve (difficult to determine). This likely represents  calcified mass vs papillary fibroelastoma. There is no regurgitation or  significant destruction of the valve to  suggest this is vegetation. Clinical correlation is needed and would  recommend TEE for better characterization. The aortic valve is tricuspid.  Aortic valve regurgitation is not visualized. No aortic stenosis is  present.  2. Left ventricular ejection fraction, by estimation, is 60 to 65%. The  left ventricle has normal function. The left ventricle has no regional  wall motion abnormalities. There is moderate concentric left ventricular  hypertrophy. Left ventricular  diastolic parameters are consistent with Grade II diastolic dysfunction  (pseudonormalization). Elevated left atrial pressure.  3. Right ventricular systolic function is normal. The right ventricular  size is normal. Tricuspid regurgitation signal is inadequate for assessing  PA pressure.  4. The mitral  valve is grossly normal. Trivial mitral valve  regurgitation. No evidence of mitral stenosis.  5. The inferior vena cava is normal in size with greater than 50%  respiratory variability, suggesting right atrial pressure of 3 mmHg.   Laboratory Data:  High Sensitivity Troponin:   Recent Labs  Lab 08/04/20 0109  TROPONINIHS 238*      Chemistry Recent Labs  Lab 08/04/20 0109 08/04/20 0302  NA 133* 132*  K 4.0 4.4  CL 100 101  CO2 22 18*  GLUCOSE 258* 265*  BUN 21* 21*  CREATININE 2.06* 2.01*  CALCIUM 9.2 8.8*  GFRNONAA 29* 30*  ANIONGAP 11 13    Recent Labs  Lab 08/04/20 0109  PROT 7.7  ALBUMIN 3.5  AST 22  ALT 17  ALKPHOS 41  BILITOT 0.6   Hematology Recent Labs  Lab 08/04/20 0109 08/04/20 0302  WBC 15.0* 12.0*  RBC 3.98 4.13  HGB 10.6* 10.9*  HCT 35.1* 34.3*  MCV 88.2 83.1  MCH 26.6 26.4  MCHC 30.2 31.8  RDW 14.2 14.0  PLT 316 295   BNPNo results for input(s): BNP, PROBNP in the last 168 hours.  DDimer No results for  input(s): DDIMER in the last 168 hours.  Radiology/Studies:  CARDIAC CATHETERIZATION  Result Date: 08/04/2020  Prox LAD lesion is 60% stenosed.  Mid LAD lesion is 50% stenosed.  1st Diag lesion is 80% stenosed.  Ramus lesion is 50% stenosed.  Mid Cx to Dist Cx lesion is 100% stenosed.  Prox RCA to Mid RCA lesion is 80% stenosed.  Dist RCA lesion is 50% stenosed.  RPDA lesion is 40% stenosed.  LPAV lesion is 80% stenosed.  3rd LPL lesion is 90% stenosed.  1st LPL lesion is 50% stenosed.  A drug-eluting stent was successfully placed using a STENT RESOLUTE ONYX 3.5X18.  Post intervention, there is a 0% residual stenosis.  LV end diastolic pressure is normal.  1. Diffusely diseased diabetic coronary arteries with acute occlusion of the mid to distal LCx. This is a codominant vessel. Otherwise moderate 3 vessel disease. 2. Normal LVEDP 3. Successful PCI of the mid LCx with overlapping DES x 2 Plan: DAPT for at least one year.  Aggressive risk factor modification and antianginal therapy. At this point would treat her residual disease medically. If she should have refractory angina in the future would need to be considered for CABG.   DG Chest Portable 1 View  Result Date: 08/04/2020 CLINICAL DATA:  Code STEMI EXAM: PORTABLE CHEST 1 VIEW COMPARISON:  None. FINDINGS: The heart size and mediastinal contours are within normal limits. Both lungs are clear. The visualized skeletal structures are unremarkable. IMPRESSION: No active disease. Electronically Signed   By: Prudencio Pair M.D.   On: 08/04/2020 01:26   HEART score (9)  Assessment and Plan:   48F with HTN, HLD, and DM2 who presented with inferolateral STEMI now s/p PCI x2 to her mid LCx.   1. LCx STEMI (resolved)  Ms. Daneli has poorly controlled diabetes, obesity, HLD and HTN which all likely contributed to her coronary event.  Fortunately she is status post successful revascularization of her LCx.  Unfortunately he is completely medication compliant although she does say that she usually takes as her meds as prescribed outside of this is like recently initiated on her medications.  She has no contraindication to ACE inhibitor is currently on irbesartan as an outpatient so we will resume this more distance.  She is on a high dose of carvedilol as an outpatient so will start this back at reduced dose as well.  She was loaded aspirin on additional EMS transport and ticagrelor 180 mg p.o. in the Cath Lab.  Will plan to continue daily aspirin for minimum of 1 year setting of STEMI.  I think she would benefit from additional SGLT2 inhibitor given her newly diagnosed coronary disease and T2DM.  We will plan to see her back with low symptoms.  Daily with plan to uptitrate to 25 mg p.o. daily prior to discharge. - start back irbesartan at reduced dose (75 mg PO daily, home dose 300 mg PO daily), no allergy/contraindication or previous reaction to ACEi however apparently needed  max dose irbesartan to attempt to control her resistant HTN - start back coreg at reduced dose (12.5 mg PO bid, home dose 25 mg PO bid) - continue home ASA 81 mg PO daily, start ticag 90 mg PO bid - increase home crestor 10 mg PO daily to 20 mg PO daily with ACS, prior LDLs 160-200, TG very elevated as well (>400), check to see if insurance will cover vascepa, likely will need PCSK9i if LDL remains uncontrolled despite higher dose crestor  -  TTE in AM   2. Resistant HTN On multiple agents and says that she does routinely take all medications as prescribed and that this recent occurrence was abnormal for her. sBP 120s on nitroglycerin 10 mcg/min and off all meds. She did have her AM doses of most mediations 12/04 AM.  - start back reduced dose irbesartan as above - start back reduced dose coreg as above - hold clonidine - hold amlodipine  - hold hydral   3. DM2 Poorly controlled DM2 with recent A1c (9.9) on admission. She would be ok if insulin is recommended. She has been on metformin/semaglutide/glimepiride.  - hold PO home diabetes meds - start empa 10 mg PO daily with plans to uptitrate to 25 mg PO daily, GFR slightly worse than baseline, borderline for empa but risk/benefit favors starting, need to check on cost, has insurance, send test prescription   - can restart metformin if lab vwork unremarkable 12/05 AM  - hold semaglutide/glimepiride  Severity of Illness: The appropriate patient status for this patient is INPATIENT. Inpatient status is judged to be reasonable and necessary in order to provide the required intensity of service to ensure the patient's safety. The patient's presenting symptoms, physical exam findings, and initial radiographic and laboratory data in the context of their chronic comorbidities is felt to place them at high risk for further clinical deterioration. Furthermore, it is not anticipated that the patient will be medically stable for discharge from the hospital  within 2 midnights of admission. The following factors support the patient status of inpatient.   " The patient's presenting symptoms include STEMI. " The worrisome physical exam findings include chest pain. " The initial radiographic and laboratory data are worrisome because of elevated troponins. " The chronic co-morbidities include HTN, HLD, DM2.  * I certify that at the point of admission it is my clinical judgment that the patient will require inpatient hospital care spanning beyond 2 midnights from the point of admission due to high intensity of service, high risk for further deterioration and high frequency of surveillance required.*   For questions or updates, please contact [place] Please consult [email or web address] for contact info under   Signed, Dion Body, MD  08/04/2020 3:51 AM

## 2020-08-04 NOTE — Plan of Care (Signed)
Education ongoing. Patient is receptive to information.

## 2020-08-04 NOTE — ED Notes (Signed)
Patient transported to cath lab

## 2020-08-04 NOTE — Plan of Care (Signed)
?  Problem: Clinical Measurements: ?Goal: Will remain free from infection ?Outcome: Progressing ?  ?

## 2020-08-05 ENCOUNTER — Encounter (HOSPITAL_COMMUNITY): Payer: Self-pay | Admitting: Cardiology

## 2020-08-05 ENCOUNTER — Inpatient Hospital Stay (HOSPITAL_COMMUNITY): Payer: 59

## 2020-08-05 ENCOUNTER — Ambulatory Visit (HOSPITAL_BASED_OUTPATIENT_CLINIC_OR_DEPARTMENT_OTHER): Payer: 59 | Attending: Cardiovascular Disease | Admitting: Cardiovascular Disease

## 2020-08-05 ENCOUNTER — Encounter (HOSPITAL_COMMUNITY)
Admission: EM | Disposition: A | Discharge status: Home / Self Care | Payer: Self-pay | Source: Home / Self Care | Attending: Cardiology

## 2020-08-05 DIAGNOSIS — I2510 Atherosclerotic heart disease of native coronary artery without angina pectoris: Secondary | ICD-10-CM

## 2020-08-05 DIAGNOSIS — I340 Nonrheumatic mitral (valve) insufficiency: Secondary | ICD-10-CM

## 2020-08-05 DIAGNOSIS — I10 Essential (primary) hypertension: Secondary | ICD-10-CM

## 2020-08-05 DIAGNOSIS — N1832 Chronic kidney disease, stage 3b: Secondary | ICD-10-CM

## 2020-08-05 DIAGNOSIS — I2121 ST elevation (STEMI) myocardial infarction involving left circumflex coronary artery: Secondary | ICD-10-CM | POA: Diagnosis not present

## 2020-08-05 LAB — CBC
HCT: 40.7 % (ref 36.0–46.0)
Hemoglobin: 12.9 g/dL (ref 12.0–15.0)
MCH: 25.9 pg — ABNORMAL LOW (ref 26.0–34.0)
MCHC: 31.7 g/dL (ref 30.0–36.0)
MCV: 81.6 fL (ref 80.0–100.0)
Platelets: 196 10*3/uL (ref 150–400)
RBC: 4.99 MIL/uL (ref 3.87–5.11)
RDW: 14 % (ref 11.5–15.5)
WBC: 9.6 10*3/uL (ref 4.0–10.5)
nRBC: 0 % (ref 0.0–0.2)

## 2020-08-05 LAB — GLUCOSE, CAPILLARY
Glucose-Capillary: 140 mg/dL — ABNORMAL HIGH (ref 70–99)
Glucose-Capillary: 169 mg/dL — ABNORMAL HIGH (ref 70–99)
Glucose-Capillary: 119 mg/dL — ABNORMAL HIGH (ref 70–99)
Glucose-Capillary: 153 mg/dL — ABNORMAL HIGH (ref 70–99)

## 2020-08-05 LAB — ECHOCARDIOGRAM COMPLETE
AR max vel: 2.14 cm2
AV Area VTI: 2.06 cm2
AV Area mean vel: 2.03 cm2
AV Mean grad: 6 mmHg
AV Peak grad: 10.2 mmHg
Ao pk vel: 1.6 m/s
Area-P 1/2: 2.62 cm2
Height: 65.984 in
S' Lateral: 2.9 cm
Weight: 3171.1 oz

## 2020-08-05 LAB — BASIC METABOLIC PANEL
Anion gap: 11 (ref 5–15)
BUN: 23 mg/dL — ABNORMAL HIGH (ref 6–20)
CO2: 20 mmol/L — ABNORMAL LOW (ref 22–32)
Calcium: 8.4 mg/dL — ABNORMAL LOW (ref 8.9–10.3)
Chloride: 102 mmol/L (ref 98–111)
Creatinine, Ser: 2.56 mg/dL — ABNORMAL HIGH (ref 0.44–1.00)
GFR, Estimated: 22 mL/min — ABNORMAL LOW (ref >60)
Glucose, Bld: 199 mg/dL — ABNORMAL HIGH (ref 70–99)
Potassium: 3.7 mmol/L (ref 3.5–5.1)
Sodium: 133 mmol/L — ABNORMAL LOW (ref 135–145)

## 2020-08-05 LAB — POCT ACTIVATED CLOTTING TIME
Activated Clotting Time: 416 seconds
Activated Clotting Time: 142 seconds

## 2020-08-05 SURGERY — CORONARY/GRAFT ACUTE MI REVASCULARIZATION
Anesthesia: LOCAL

## 2020-08-05 MED ORDER — ENOXAPARIN SODIUM 30 MG/0.3ML ~~LOC~~ SOLN
30.0000 mg | SUBCUTANEOUS | Status: DC
  Filled 2020-08-05: qty 0.3

## 2020-08-05 MED ORDER — HYDRALAZINE HCL 50 MG PO TABS
100.0000 mg | ORAL_TABLET | Freq: Four times a day (QID) | ORAL | Status: DC
  Administered 2020-08-05 – 2020-08-06 (×5): 100 mg via ORAL
  Filled 2020-08-05 (×5): qty 2

## 2020-08-05 MED ORDER — POTASSIUM CHLORIDE CRYS ER 20 MEQ PO TBCR
20.0000 meq | EXTENDED_RELEASE_TABLET | Freq: Once | ORAL | Status: AC
  Administered 2020-08-05: 20 meq via ORAL
  Filled 2020-08-05: qty 1

## 2020-08-05 MED ORDER — AMLODIPINE BESYLATE 10 MG PO TABS
10.0000 mg | ORAL_TABLET | Freq: Every day | ORAL | Status: DC
  Administered 2020-08-05 – 2020-08-06 (×2): 10 mg via ORAL
  Filled 2020-08-05 (×2): qty 1

## 2020-08-05 MED ORDER — SERTRALINE HCL 50 MG PO TABS
25.0000 mg | ORAL_TABLET | Freq: Every day | ORAL | Status: DC
  Administered 2020-08-05 – 2020-08-06 (×2): 25 mg via ORAL
  Filled 2020-08-05 (×2): qty 1

## 2020-08-05 MED ORDER — GLIMEPIRIDE 2 MG PO TABS
2.0000 mg | ORAL_TABLET | Freq: Every day | ORAL | Status: DC
  Administered 2020-08-05 – 2020-08-06 (×2): 2 mg via ORAL
  Filled 2020-08-05 (×2): qty 1

## 2020-08-05 MED ORDER — SODIUM CHLORIDE 0.9 % IV SOLN: INTRAVENOUS | Status: AC

## 2020-08-05 MED ORDER — CARVEDILOL 25 MG PO TABS
25.0000 mg | ORAL_TABLET | Freq: Two times a day (BID) | ORAL | Status: DC
  Administered 2020-08-05 – 2020-08-06 (×2): 25 mg via ORAL
  Filled 2020-08-05 (×3): qty 1

## 2020-08-05 NOTE — Progress Notes (Signed)
Progress Note  Patient Name: Shonia Skilling Date of Encounter: 08/05/2020  [facility] Cardiologist: Kirk Ruths, MD   Subjective   No chest pain. No dyspnea.   Inpatient Medications    Scheduled Meds: . aspirin EC  81 mg Oral Daily  . carvedilol  12.5 mg Oral BID WC  . Chlorhexidine Gluconate Cloth  6 each Topical Daily  . empagliflozin  10 mg Oral Daily  . enoxaparin (LOVENOX) injection  40 mg Subcutaneous Q24H  . insulin aspart  0-15 Units Subcutaneous TID WC  . insulin aspart  0-5 Units Subcutaneous QHS  . irbesartan  75 mg Oral Daily  . rosuvastatin  20 mg Oral Daily  . sodium chloride flush  3 mL Intravenous Q12H  . ticagrelor  90 mg Oral BID   Continuous Infusions: . sodium chloride Stopped (08/04/20 1235)  . sodium chloride    . nitroGLYCERIN 65 mcg/min (08/05/20 0724)   PRN Meds: sodium chloride, acetaminophen, ondansetron (ZOFRAN) IV, sodium chloride flush   Vital Signs    Vitals:   08/05/20 0400 08/05/20 0500 08/05/20 0600 08/05/20 0700  BP: 131/74 128/85 (!) 139/91 (!) 156/91  Pulse: 83 79 80 90  Resp: 19 18 18 16   Temp: 98.8 F (37.1 C)   98.7 F (37.1 C)  TempSrc: Oral   Oral  SpO2: 96% 97% 96% 97%  Weight:      Height:        Intake/Output Summary (Last 24 hours) at 08/05/2020 0914 Last data filed at 08/05/2020 0400 Gross per 24 hour  Intake 670.45 ml  Output 100 ml  Net 570.45 ml   Last 3 Weights 08/04/2020 08/04/2020 08/04/2020  Weight (lbs) 198 lb 3.1 oz 198 lb 3.1 oz 209 lb 7 oz  Weight (kg) 89.9 kg 89.9 kg 95 kg      Telemetry    Sinus - Personally Reviewed  ECG    No AM EKG - Personally Reviewed  Physical Exam   GEN: No acute distress.   Neck: No JVD Cardiac: RRR, no murmurs, rubs, or gallops.  Respiratory: Clear to auscultation bilaterally. GI: Soft, nontender, non-distended  MS: No edema; No deformity. Neuro:  Nonfocal  Psych: Normal affect   Labs    High Sensitivity Troponin:   Recent Labs  Lab  08/04/20 0109 08/04/20 0302  TROPONINIHS 238* >27,000*      Chemistry Recent Labs  Lab 08/04/20 0109 08/04/20 0302 08/05/20 0054  NA 133* 132* 133*  K 4.0 4.4 3.7  CL 100 101 102  CO2 22 18* 20*  GLUCOSE 258* 265* 199*  BUN 21* 21* 23*  CREATININE 2.06* 2.01* 2.56*  CALCIUM 9.2 8.8* 8.4*  PROT 7.7  --   --   ALBUMIN 3.5  --   --   AST 22  --   --   ALT 17  --   --   ALKPHOS 41  --   --   BILITOT 0.6  --   --   GFRNONAA 29* 30* 22*  ANIONGAP 11 13 11      Hematology Recent Labs  Lab 08/04/20 0109 08/04/20 0302 08/05/20 0054  WBC 15.0* 12.0* 9.6  RBC 3.98 4.13 4.99  HGB 10.6* 10.9* 12.9  HCT 35.1* 34.3* 40.7  MCV 88.2 83.1 81.6  MCH 26.6 26.4 25.9*  MCHC 30.2 31.8 31.7  RDW 14.2 14.0 14.0  PLT 316 295 196    BNPNo results for input(s): BNP, PROBNP in the last 168 hours.   DDimer  No results for input(s): DDIMER in the last 168 hours.   Radiology    CARDIAC CATHETERIZATION  Result Date: 08/04/2020  Prox LAD lesion is 60% stenosed.  Mid LAD lesion is 50% stenosed.  1st Diag lesion is 80% stenosed.  Ramus lesion is 50% stenosed.  Mid Cx to Dist Cx lesion is 100% stenosed.  Prox RCA to Mid RCA lesion is 80% stenosed.  Dist RCA lesion is 50% stenosed.  RPDA lesion is 40% stenosed.  LPAV lesion is 80% stenosed.  3rd LPL lesion is 90% stenosed.  1st LPL lesion is 50% stenosed.  A drug-eluting stent was successfully placed using a STENT RESOLUTE ONYX 3.5X18.  Post intervention, there is a 0% residual stenosis.  LV end diastolic pressure is normal.  1. Diffusely diseased diabetic coronary arteries with acute occlusion of the mid to distal LCx. This is a codominant vessel. Otherwise moderate 3 vessel disease. 2. Normal LVEDP 3. Successful PCI of the mid LCx with overlapping DES x 2 Plan: DAPT for at least one year. Aggressive risk factor modification and antianginal therapy. At this point would treat her residual disease medically. If she should have refractory  angina in the future would need to be considered for CABG.   DG Chest Portable 1 View  Result Date: 08/04/2020 CLINICAL DATA:  Code STEMI EXAM: PORTABLE CHEST 1 VIEW COMPARISON:  None. FINDINGS: The heart size and mediastinal contours are within normal limits. Both lungs are clear. The visualized skeletal structures are unremarkable. IMPRESSION: No active disease. Electronically Signed   By: Prudencio Pair M.D.   On: 08/04/2020 01:26    Cardiac Studies     Patient Profile     49 y.o. female with history of HTN, HLD and DM who was admitted 08/04/20 with an acute inferolateral STEMI secondary to occlusion of the mid Circumflex artery. A drug eluting stent was placed. Residual moderate disease in the LAD and RCA with plans for medical management for now.   Assessment & Plan    1. CAD/Inferolateral STEMI: She presented in the early hours of 08/04/20 with interolateral ST elevation. No s/p stenting of the Circumflex artery. No chest pain today. She has moderate disease in the LAD and RCA. Plan for medical management of remainder of disease.  Continue DAPT with ASA and Brilinta.  Continue statin, beta blocker.  Will hold ARB given worsened renal function.  Echo pending today.   2. HTN: BP controlled. Wean NTG drip. Add back home hydralazine and Norvasc. Increase Coreg back to home dose of 25 mg daily.    3. Hyperlipidemia: Continue high intensity statin.   4. Chronic Kidney disease, stage 3: Creatinine worse this am. Continue hydration today. Will hold ARB today. Repeat BMET in the am.   Transfer to telemetry unit.   For questions or updates, please contact [place] Please consult [email or web address] for contact info under        Signed, Lauree Chandler, MD  08/05/2020, 9:14 AM

## 2020-08-05 NOTE — Progress Notes (Signed)
  Echocardiogram 2D Echocardiogram has been performed.  Casey Acosta 08/05/2020, 11:26 AM

## 2020-08-05 NOTE — Plan of Care (Signed)
  Problem: Education: Goal: Knowledge of General Education information will improve Description Including pain rating scale, medication(s)/side effects and non-pharmacologic comfort measures Outcome: Progressing   Problem: Health Behavior/Discharge Planning: Goal: Ability to manage health-related needs will improve Outcome: Progressing   

## 2020-08-05 NOTE — Progress Notes (Signed)
Patient transferred to 3E20, handover report given to [place]. Patient transferred via wheelchair

## 2020-08-05 NOTE — Progress Notes (Signed)
CARDIAC REHAB PHASE I   PRE:  Rate/Rhythm: 93 SR    BP: sitting 138/78    SaO2: 95 RA  MODE:  Ambulation: 370 ft   POST:  Rate/Rhythm: 107 ST    BP: sitting 141/93     SaO2: 98 RA  Pt able to ambulate without CP or SOB, which she has had for months now. Feeling well. She has had significant stress with the passing of her aunt this weekend and a new job. Long discussion of MI, stent, Brilinta, restrictions, stress relief, diet and CRPII. Pt voicing understanding. She has struggled with her meds due to poor insurance coverage but is starting a new insurance today. Notified CM. Will refer to [place], ACSM 08/05/2020 10:01 AM

## 2020-08-06 ENCOUNTER — Other Ambulatory Visit: Payer: Self-pay | Admitting: Cardiology

## 2020-08-06 DIAGNOSIS — Z79899 Other long term (current) drug therapy: Secondary | ICD-10-CM

## 2020-08-06 LAB — CBC
HCT: 28.1 % — ABNORMAL LOW (ref 36.0–46.0)
Hemoglobin: 9.3 g/dL — ABNORMAL LOW (ref 12.0–15.0)
MCH: 27 pg (ref 26.0–34.0)
MCHC: 33.1 g/dL (ref 30.0–36.0)
MCV: 81.7 fL (ref 80.0–100.0)
Platelets: 308 10*3/uL (ref 150–400)
RBC: 3.44 MIL/uL — ABNORMAL LOW (ref 3.87–5.11)
RDW: 14.2 % (ref 11.5–15.5)
WBC: 12 10*3/uL — ABNORMAL HIGH (ref 4.0–10.5)
nRBC: 0 % (ref 0.0–0.2)

## 2020-08-06 LAB — BASIC METABOLIC PANEL
Anion gap: 8 (ref 5–15)
BUN: 19 mg/dL (ref 6–20)
CO2: 23 mmol/L (ref 22–32)
Calcium: 8.8 mg/dL — ABNORMAL LOW (ref 8.9–10.3)
Chloride: 106 mmol/L (ref 98–111)
Creatinine, Ser: 2.54 mg/dL — ABNORMAL HIGH (ref 0.44–1.00)
GFR, Estimated: 22 mL/min — ABNORMAL LOW (ref >60)
Glucose, Bld: 130 mg/dL — ABNORMAL HIGH (ref 70–99)
Potassium: 3.8 mmol/L (ref 3.5–5.1)
Sodium: 137 mmol/L (ref 135–145)

## 2020-08-06 LAB — GLUCOSE, CAPILLARY
Glucose-Capillary: 125 mg/dL — ABNORMAL HIGH (ref 70–99)
Glucose-Capillary: 148 mg/dL — ABNORMAL HIGH (ref 70–99)

## 2020-08-06 MED ORDER — ROSUVASTATIN CALCIUM 40 MG PO TABS
40.0000 mg | ORAL_TABLET | Freq: Every day | ORAL | 0 refills | Status: DC
Start: 2020-08-07 — End: 2020-08-14

## 2020-08-06 MED ORDER — TICAGRELOR 90 MG PO TABS
90.0000 mg | ORAL_TABLET | Freq: Two times a day (BID) | ORAL | 2 refills | Status: DC
Start: 2020-08-06 — End: 2021-03-11

## 2020-08-06 MED ORDER — ROSUVASTATIN CALCIUM 20 MG PO TABS: 40.0000 mg | ORAL_TABLET | Freq: Every day | ORAL | Status: DC

## 2020-08-06 MED ORDER — NITROGLYCERIN 0.4 MG SL SUBL: 0.4000 mg | SUBLINGUAL_TABLET | SUBLINGUAL | 2 refills | Status: DC | PRN

## 2020-08-06 NOTE — Progress Notes (Addendum)
Progress Note  Patient Name: Casey Acosta Date of Encounter: 08/06/2020  [facility] Cardiologist: Kirk Ruths, MD   Subjective   Feeling well this morning. No chest pain.   Inpatient Medications    Scheduled Meds: . amLODipine  10 mg Oral Daily  . aspirin EC  81 mg Oral Daily  . carvedilol  25 mg Oral BID WC  . Chlorhexidine Gluconate Cloth  6 each Topical Daily  . enoxaparin (LOVENOX) injection  30 mg Subcutaneous Q24H  . glimepiride  2 mg Oral Q breakfast  . hydrALAZINE  100 mg Oral Q6H  . insulin aspart  0-15 Units Subcutaneous TID WC  . insulin aspart  0-5 Units Subcutaneous QHS  . rosuvastatin  20 mg Oral Daily  . sertraline  25 mg Oral Daily  . sodium chloride flush  3 mL Intravenous Q12H  . ticagrelor  90 mg Oral BID   Continuous Infusions: . sodium chloride Stopped (08/04/20 1235)  . sodium chloride    . nitroGLYCERIN Stopped (08/05/20 2117)   PRN Meds: sodium chloride, acetaminophen, ondansetron (ZOFRAN) IV, sodium chloride flush   Vital Signs    Vitals:   08/05/20 1623 08/05/20 2107 08/05/20 2358 08/06/20 0431  BP: 132/78 (!) 108/59 131/80 130/87  Pulse: 81 79 78 75  Resp: 18 15 20 15   Temp: 98.3 F (36.8 C) 98.8 F (37.1 C) 98.7 F (37.1 C) 98.6 F (37 C)  TempSrc:  Oral Oral Oral  SpO2: 98% 98% 98% 98%  Weight:    90.9 kg  Height:        Intake/Output Summary (Last 24 hours) at 08/06/2020 1024 Last data filed at 08/06/2020 0848 Gross per 24 hour  Intake 1153.8 ml  Output 500 ml  Net 653.8 ml   Last 3 Weights 08/06/2020 08/05/2020 08/04/2020  Weight (lbs) 200 lb 4.8 oz 201 lb 198 lb 3.1 oz  Weight (kg) 90.855 kg 91.173 kg 89.9 kg      Telemetry    SR rates 80s - Personally Reviewed  ECG    No new tracing  Physical Exam  Pleasant female, sitting up in the chair GEN: No acute distress.   Neck: No JVD Cardiac: RRR, no murmurs, rubs, or gallops.  Respiratory: Clear to auscultation bilaterally. GI: Soft, nontender,  non-distended  MS: No edema; No deformity. Right wrist stable. Neuro:  Nonfocal  Psych: Normal affect   Labs    High Sensitivity Troponin:   Recent Labs  Lab 08/04/20 0109 08/04/20 0302  TROPONINIHS 238* >27,000*      Chemistry Recent Labs  Lab 08/04/20 0109 08/04/20 0109 08/04/20 0302 08/05/20 0054 08/06/20 0412  NA 133*   < > 132* 133* 137  K 4.0   < > 4.4 3.7 3.8  CL 100   < > 101 102 106  CO2 22   < > 18* 20* 23  GLUCOSE 258*   < > 265* 199* 130*  BUN 21*   < > 21* 23* 19  CREATININE 2.06*   < > 2.01* 2.56* 2.54*  CALCIUM 9.2   < > 8.8* 8.4* 8.8*  PROT 7.7  --   --   --   --   ALBUMIN 3.5  --   --   --   --   AST 22  --   --   --   --   ALT 17  --   --   --   --   ALKPHOS 41  --   --   --   --  BILITOT 0.6  --   --   --   --   GFRNONAA 29*   < > 30* 22* 22*  ANIONGAP 11   < > 13 11 8    < > = values in this interval not displayed.     Hematology Recent Labs  Lab 08/04/20 0302 08/05/20 0054 08/06/20 0412  WBC 12.0* 9.6 12.0*  RBC 4.13 4.99 3.44*  HGB 10.9* 12.9 9.3*  HCT 34.3* 40.7 28.1*  MCV 83.1 81.6 81.7  MCH 26.4 25.9* 27.0  MCHC 31.8 31.7 33.1  RDW 14.0 14.0 14.2  PLT 295 196 308    BNPNo results for input(s): BNP, PROBNP in the last 168 hours.   DDimer No results for input(s): DDIMER in the last 168 hours.   Radiology    ECHOCARDIOGRAM COMPLETE  Result Date: 08/05/2020    ECHOCARDIOGRAM REPORT   Patient Name:   Casey Acosta Date of Exam: 08/05/2020 Medical Rec #:  [id number]        Height:       66.0 in Accession #:    [id number]       Weight:       198.2 lb Date of Birth:  1971-05-12        BSA:          1.992 m Patient Age:    49 years         BP:           137/77 mmHg Patient Gender: F                HR:           84 bpm. Exam Location:  Inpatient Procedure: 2D Echo, Cardiac Doppler and Color Doppler Indications:    Acute coronary syndrome  History:        Patient has prior history of Echocardiogram examinations, most                  recent 05/27/2020. Signs/Symptoms:Dyspnea and Syncope; Risk                 Factors:Hypertension, Diabetes and Dyslipidemia. Cardiomyopathy.                 CKD. LVH.  Sonographer:    Clayton Lefort RDCS (AE) Referring Phys: [id number] [place]  1. There is a small mass (0.6 cm x 0.5 cm) attached to the [facility] of the aortic valve. The lesion is not freely mobile and moves with the leaflet. Recent TEE reported this to be nodular thickening and not a papillary fibroelastoma. The aortic valve is tricuspid. Aortic valve regurgitation is not visualized. No aortic stenosis is present.  2. Left ventricular ejection fraction, by estimation, is 55 to 60%. The left ventricle has normal function. The left ventricle has no regional wall motion abnormalities. There is severe concentric left ventricular hypertrophy. Left ventricular diastolic  parameters are consistent with Grade II diastolic dysfunction (pseudonormalization). Elevated left atrial pressure. The E/e' is 19.0.  3. Right ventricular systolic function is normal. The right ventricular size is normal. Tricuspid regurgitation signal is inadequate for assessing PA pressure.  4. A small pericardial effusion is present. The pericardial effusion is circumferential. There is no evidence of cardiac tamponade.  5. The mitral valve is grossly normal. Mild mitral valve regurgitation. No evidence of mitral stenosis.  6. The inferior vena cava is normal in size with greater than 50% respiratory variability, suggesting right atrial pressure of 3 mmHg. Comparison(s): No  significant change from prior study. FINDINGS  Left Ventricle: Left ventricular ejection fraction, by estimation, is 55 to 60%. The left ventricle has normal function. The left ventricle has no regional wall motion abnormalities. The left ventricular internal cavity size was normal in size. There is  severe concentric left ventricular hypertrophy. Left ventricular diastolic parameters are consistent with  Grade II diastolic dysfunction (pseudonormalization). Elevated left atrial pressure. The E/e' is 19.0. Right Ventricle: The right ventricular size is normal. No increase in right ventricular wall thickness. Right ventricular systolic function is normal. Tricuspid regurgitation signal is inadequate for assessing PA pressure. Left Atrium: Left atrial size was normal in size. Right Atrium: Right atrial size was normal in size. Pericardium: A small pericardial effusion is present. The pericardial effusion is circumferential. There is no evidence of cardiac tamponade. Mitral Valve: The mitral valve is grossly normal. Mild mitral valve regurgitation. No evidence of mitral valve stenosis. MV peak gradient, 4.2 mmHg. The mean mitral valve gradient is 2.0 mmHg. Tricuspid Valve: The tricuspid valve is grossly normal. Tricuspid valve regurgitation is trivial. No evidence of tricuspid stenosis. Aortic Valve: There is a small mass (0.6 cm x 0.5 cm) attached to the [facility] of the aortic valve. The lesion is not freely mobile and moves with the leaflet. Recent TEE reported this to be nodular thickening and not a papillary fibroelastoma. The aortic valve is tricuspid. Aortic valve regurgitation is not visualized. No aortic stenosis is present. Aortic valve mean gradient measures 6.0 mmHg. Aortic valve peak gradient measures 10.2 mmHg. Aortic valve area, by VTI measures 2.06 cm. Pulmonic Valve: The pulmonic valve was grossly normal. Pulmonic valve regurgitation is not visualized. No evidence of pulmonic stenosis. Aorta: The aortic root and ascending aorta are structurally normal, with no evidence of dilitation. Venous: The right upper pulmonary vein is normal. The inferior vena cava is normal in size with greater than 50% respiratory variability, suggesting right atrial pressure of 3 mmHg. IAS/Shunts: The atrial septum is grossly normal.  LEFT VENTRICLE PLAX 2D LVIDd:         4.10 cm      Diastology LVIDs:         2.90 cm      LV e'  medial:    4.60 cm/s LV PW:         2.30 cm      LV E/e' medial:  19.0 LV IVS:        2.00 cm      LV e' lateral:   4.60 cm/s LVOT diam:     2.10 cm      LV E/e' lateral: 19.0 LV SV:         59 LV SV Index:   30 LVOT Area:     3.46 cm                              3D Volume EF: LV Volumes (MOD)            3D EF:        44 % LV vol d, MOD A4C: 107.0 ml LV EDV:       174 ml LV SV MOD A4C:     107.0 ml LV ESV:       98 ml                             LV SV:  76 ml RIGHT VENTRICLE             IVC RV Basal diam:  3.00 cm     IVC diam: 2.10 cm RV S prime:     17.70 cm/s TAPSE (M-mode): 2.5 cm LEFT ATRIUM             Index       RIGHT ATRIUM           Index LA diam:        4.80 cm 2.41 cm/m  RA Area:     13.60 cm LA Vol (A2C):   54.6 ml 27.41 ml/m RA Volume:   29.40 ml  14.76 ml/m LA Vol (A4C):   42.6 ml 21.38 ml/m LA Biplane Vol: 49.3 ml 24.75 ml/m  AORTIC VALVE AV Area (Vmax):    2.14 cm AV Area (Vmean):   2.03 cm AV Area (VTI):     2.06 cm AV Vmax:           160.00 cm/s AV Vmean:          119.000 cm/s AV VTI:            0.287 m AV Peak Grad:      10.2 mmHg AV Mean Grad:      6.0 mmHg LVOT Vmax:         98.80 cm/s LVOT Vmean:        69.800 cm/s LVOT VTI:          0.171 m LVOT/AV VTI ratio: 0.60  AORTA Ao Root diam: 3.50 cm Ao Asc diam:  3.40 cm MITRAL VALVE MV Area (PHT): 2.62 cm    SHUNTS MV Peak grad:  4.2 mmHg    Systemic VTI:  0.17 m MV Mean grad:  2.0 mmHg    Systemic Diam: 2.10 cm MV Vmax:       1.03 m/s MV Vmean:      59.2 cm/s MV Decel Time: 289 msec MV E velocity: 87.20 cm/s MV A velocity: 94.90 cm/s MV E/A ratio:  0.92 Eleonore Chiquito MD Electronically signed by Eleonore Chiquito MD Signature Date/Time: 08/05/2020/12:26:11 PM    Final     Cardiac Studies   Cath: 08/04/20   Prox LAD lesion is 60% stenosed.  Mid LAD lesion is 50% stenosed.  1st Diag lesion is 80% stenosed.  Ramus lesion is 50% stenosed.  Mid Cx to Dist Cx lesion is 100% stenosed.  Prox RCA to Mid RCA lesion is 80%  stenosed.  Dist RCA lesion is 50% stenosed.  RPDA lesion is 40% stenosed.  LPAV lesion is 80% stenosed.  3rd LPL lesion is 90% stenosed.  1st LPL lesion is 50% stenosed.  A drug-eluting stent was successfully placed using a STENT RESOLUTE ONYX 3.5X18.  Post intervention, there is a 0% residual stenosis.  LV end diastolic pressure is normal.   1. Diffusely diseased diabetic coronary arteries with acute occlusion of the mid to distal LCx. This is a codominant vessel. Otherwise moderate 3 vessel disease. 2. Normal LVEDP 3. Successful PCI of the mid LCx with overlapping DES x 2  Plan: DAPT for at least one year. Aggressive risk factor modification and antianginal therapy. At this point would treat her residual disease medically. If she should have refractory angina in the future would need to be considered for CABG.   Diagnostic Dominance: Co-dominant  Intervention   Echo: 08/05/20  IMPRESSIONS    1. There is a small mass (0.6 cm x 0.5 cm)  attached to the [facility] of the  aortic valve. The lesion is not freely mobile and moves with the leaflet.  Recent TEE reported this to be nodular thickening and not a papillary  fibroelastoma. The aortic valve is  tricuspid. Aortic valve regurgitation is not visualized. No aortic  stenosis is present.  2. Left ventricular ejection fraction, by estimation, is 55 to 60%. The  left ventricle has normal function. The left ventricle has no regional  wall motion abnormalities. There is severe concentric left ventricular  hypertrophy. Left ventricular diastolic  parameters are consistent with Grade II diastolic dysfunction  (pseudonormalization). Elevated left atrial pressure. The E/e' is 19.0.  3. Right ventricular systolic function is normal. The right ventricular  size is normal. Tricuspid regurgitation signal is inadequate for assessing  PA pressure.  4. A small pericardial effusion is present. The pericardial effusion is    circumferential. There is no evidence of cardiac tamponade.  5. The mitral valve is grossly normal. Mild mitral valve regurgitation.  No evidence of mitral stenosis.  6. The inferior vena cava is normal in size with greater than 50%  respiratory variability, suggesting right atrial pressure of 3 mmHg.   Comparison(s): No significant change from prior study.   Patient Profile     49 y.o. female with history of HTN, HLD and DM who was admitted 08/04/20 with an acute inferolateral STEMI secondary to occlusion of the mid Circumflex artery. A drug eluting stent was placed. Residual moderate disease in the LAD and RCA with plans for medical management for now.   Assessment & Plan    1. CAD with Inferolateral STEMI: presented on 08/04/20 with interolateral ST elevation. Now s/p stenting of the Circumflex artery. hsTn >27000. Has residual moderate disease in the LAD and RCA. Plan for medical management of residual disease. Continue DAPT with ASA/Brilinta. On statin and BB therapy. No room for ARB with renal dysfunction. Follow up echo showed normal EF with no rWMA noted. Worked well with CR without recurrent chest pain.   2. HTN: BP controlled. Weaned NTG drip. Continue hydralazine, Norvasc and Coreg.  3. Hyperlipidemia: Continue high intensity statin.   4. Chronic Kidney disease, stage 3: Cr is stable this morning, suspect this is peaking. Was given IV hydration post cath. Continue to hold ARB, will stop Jardiance for now as well.  -- Repeat BMET in am.   5. DM: Hgb A1c 9.9 -- on metformin, amaryl, ozempic PTA -- holding Jardiance with elevated Cr  6. Mixed HLD: on Crestor 20mg  daily-->will further increase to 40mg  daily as trig were 425 on admission.  -- check cost of vascepa  For questions or updates, please contact [place] Please consult [email or web address] for contact info under     Signed, Reino Bellis, NP  08/06/2020, 10:24 AM    Patient seen, examined. Available data  reviewed. Agree with findings, assessment, and plan as outlined by Reino Bellis, NP.  The patient appears medically stable today.  She has had no recurrent chest pain.  On my exam, she is alert, oriented, in no distress.  Lungs are clear, JVP is normal, heart is regular rate and rhythm without murmur gallop, abdomen is soft and nontender, right radial site is clear with no hematoma or ecchymosis, extremities have no edema.  Labs are reviewed and she does have evidence of acute on chronic kidney injury, but creatinine is stable compared to yesterday at about 2.5.  I think she can be discharged today with close outpatient lab follow-up.  I recommended that she have her labs drawn at our [facility] office on Thursday and that she follow-up closely with Dr. Oval Linsey or her APP.  I think the patient should stay out of work for 2 weeks before returning without specific restrictions at that time.  She works as a Marine scientist at [employer].  Discharge medications reviewed with her.  Would hold metformin and Jardiance with acute kidney injury.  Otherwise we will continue her home regimen.  Ticagrelor has been added in the setting of STEMI/PCI.  Sherren Mocha, M.D. 08/06/2020 12:30 PM

## 2020-08-06 NOTE — Progress Notes (Signed)
Patient on nitro drip for BP, according to notes and MAR administration instruction, nitro was to be weaned. Called MD to clarify. BP 108/59, RN stopped drip until further notice.

## 2020-08-06 NOTE — Discharge Summary (Signed)
Discharge Summary    Patient ID: Casey Acosta MRN: [id number]; DOB: 1971/03/04  Admit date: 08/04/2020 Discharge date: 08/06/2020  Primary Care Provider: Billie Ruddy, MD  Primary Cardiologist: Kirk Ruths, MD  Primary Electrophysiologist:  None   Discharge Diagnoses    Principal Problem:   STEMI involving left circumflex coronary artery [facility]) Active Problems:   Essential hypertension   Uncontrolled type 2 diabetes mellitus with diabetic nephropathy, without long-term current use of insulin ([place])   Hyperlipidemia   Chronic kidney disease, stage 3b ([place])   Class 1 obesity   Acute ST elevation myocardial infarction (STEMI) due to occlusion of circumflex coronary artery [facility])    Diagnostic Studies/Procedures    Cath: 08/04/20   Prox LAD lesion is 60% stenosed.  Mid LAD lesion is 50% stenosed.  1st Diag lesion is 80% stenosed.  Ramus lesion is 50% stenosed.  Mid Cx to Dist Cx lesion is 100% stenosed.  Prox RCA to Mid RCA lesion is 80% stenosed.  Dist RCA lesion is 50% stenosed.  RPDA lesion is 40% stenosed.  LPAV lesion is 80% stenosed.  3rd LPL lesion is 90% stenosed.  1st LPL lesion is 50% stenosed.  A drug-eluting stent was successfully placed using a STENT RESOLUTE ONYX 3.5X18.  Post intervention, there is a 0% residual stenosis.  LV end diastolic pressure is normal.   1. Diffusely diseased diabetic coronary arteries with acute occlusion of the mid to distal LCx. This is a codominant vessel. Otherwise moderate 3 vessel disease. 2. Normal LVEDP 3. Successful PCI of the mid LCx with overlapping DES x 2  Plan: DAPT for at least one year. Aggressive risk factor modification and antianginal therapy. At this point would treat her residual disease medically. If she should have refractory angina in the future would need to be considered for CABG.   Diagnostic Dominance: Co-dominant  Intervention   Echo: 08/05/20  IMPRESSIONS    1. There is  a small mass (0.6 cm x 0.5 cm) attached to the [facility] of the  aortic valve. The lesion is not freely mobile and moves with the leaflet.  Recent TEE reported this to be nodular thickening and not a papillary  fibroelastoma. The aortic valve is  tricuspid. Aortic valve regurgitation is not visualized. No aortic  stenosis is present.  2. Left ventricular ejection fraction, by estimation, is 55 to 60%. The  left ventricle has normal function. The left ventricle has no regional  wall motion abnormalities. There is severe concentric left ventricular  hypertrophy. Left ventricular diastolic  parameters are consistent with Grade II diastolic dysfunction  (pseudonormalization). Elevated left atrial pressure. The E/e' is 19.0.  3. Right ventricular systolic function is normal. The right ventricular  size is normal. Tricuspid regurgitation signal is inadequate for assessing  PA pressure.  4. A small pericardial effusion is present. The pericardial effusion is  circumferential. There is no evidence of cardiac tamponade.  5. The mitral valve is grossly normal. Mild mitral valve regurgitation.  No evidence of mitral stenosis.  6. The inferior vena cava is normal in size with greater than 50%  respiratory variability, suggesting right atrial pressure of 3 mmHg.   Comparison(s): No significant change from prior study.  _____________   History of Present Illness     Casey Acosta is a 49 y.o. female with PMH of HTN, HLD and DM who presented with chest pain and found to have inferolateral STEMI. Casey Acosta reported that she was having worsening anginal symptoms over the  past few weeks which culminated with unrelenting chest pressure around 7:30 PM prior to admission.  She stated that she had frequent episodes of central chest pressure over the past 3 months however symptoms of normal increase in frequency and were both at rest and exertion although exertion seemed to trigger more of her events.  She  would often have chest pain and shortness of breath with minimal exertion but her symptoms always resolve with rest.  She had anginal regularly with minutes.  She noticed that this was different the day of admission because her pain was much more severe although similar in character but was unrelenting.  She also had an episode of nausea and emesis which was not consistent with her prior anginal episodes.  She was evaluated by EMS and found to have significant elevations in 2, 3, aVF and some elevations in V4-V6.  Of note she unfortunately has been off of several of her hypertension medications including carvedilol and irbesartan.  She does admit that she sometimes was not taking medications for this time secondary to running out of her meds following hospital discharge without PCP follow-up.  Hospital Course     1. CAD with Inferolateral STEMI: presented on 08/04/20 with interolateral ST elevation. Now s/p stenting of the Circumflex artery. hsTn >27000. Has residual moderate disease in the LAD and RCA. Plan for medical management of residual disease. Continue DAPT with ASA/Brilinta. On statin and BB therapy. No room for ARB with renal dysfunction. Follow up echo showed normal EF with no rWMA noted. Worked well with CR without recurrent chest pain.   2. HTN: BP controlled.Weaned NTG drip. Continue hydralazine, Norvasc and Coreg.  3. Hyperlipidemia: Continue high intensity statin.   4. Chronic Kidney disease, stage 3: Cr peaked at 2.5 but remained stable at the time of discharge. Suspect this is peaking. Was given IV hydration post cath. Continue to hold ARB, and stopped Jardiance prior to discharge -- Repeat BMET in 2 days  5. DM: Hgb A1c 9.9 -- on metformin, amaryl, ozempic PTA -- holding metformin and jardiance at discharge   6. Mixed HLD: on Crestor 20mg  daily-->which was further increased to 40mg  daily as trig were 425 on admission.  -- consider vascepa as an outpatient  Did the  patient have an acute coronary syndrome (MI, NSTEMI, STEMI, etc) this admission?:  Yes                               AHA/ACC Clinical Performance & Quality Measures: 1. Aspirin prescribed? - Yes 2. ADP Receptor Inhibitor (Plavix/Clopidogrel, Brilinta/Ticagrelor or Effient/Prasugrel) prescribed (includes medically managed patients)? - Yes 3. Beta Blocker prescribed? - Yes 4. High Intensity Statin (Lipitor 40-80mg  or Crestor 20-40mg ) prescribed? - Yes 5. EF assessed during THIS hospitalization? - Yes 6. For EF <40%, was ACEI/ARB prescribed? - Not Applicable (EF >/= 16%) 7. For EF <40%, Aldosterone Antagonist (Spironolactone or Eplerenone) prescribed? - Not Applicable (EF >/= 10%) 8. Cardiac Rehab Phase II ordered (including medically managed patients)? - Yes       _____________  Discharge Vitals Blood pressure 132/84, pulse 75, temperature 98 F (36.7 C), temperature source Oral, resp. rate 17, height 5\' 6"  (1.676 m), weight 90.9 kg, SpO2 99 %.  Filed Weights   08/04/20 0327 08/05/20 1232 08/06/20 0431  Weight: 89.9 kg 91.2 kg 90.9 kg    Labs & Radiologic Studies    CBC Recent Labs    08/04/20 0109  08/04/20 0302 08/05/20 0054 08/06/20 0412  WBC 15.0*   < > 9.6 12.0*  NEUTROABS 13.3*  --   --   --   HGB 10.6*   < > 12.9 9.3*  HCT 35.1*   < > 40.7 28.1*  MCV 88.2   < > 81.6 81.7  PLT 316   < > 196 308   < > = values in this interval not displayed.   Basic Metabolic Panel Recent Labs    08/04/20 0302 08/04/20 0302 08/05/20 0054 08/06/20 0412  NA 132*   < > 133* 137  K 4.4   < > 3.7 3.8  CL 101   < > 102 106  CO2 18*   < > 20* 23  GLUCOSE 265*   < > 199* 130*  BUN 21*   < > 23* 19  CREATININE 2.01*   < > 2.56* 2.54*  CALCIUM 8.8*   < > 8.4* 8.8*  MG 1.8  --   --   --    < > = values in this interval not displayed.   Liver Function Tests Recent Labs    08/04/20 0109  AST 22  ALT 17  ALKPHOS 41  BILITOT 0.6  PROT 7.7  ALBUMIN 3.5   No results for  input(s): LIPASE, AMYLASE in the last 72 hours. High Sensitivity Troponin:   Recent Labs  Lab 08/04/20 0109 08/04/20 0302  TROPONINIHS 238* >27,000*    BNP Invalid input(s): POCBNP D-Dimer No results for input(s): DDIMER in the last 72 hours. Hemoglobin A1C Recent Labs    08/04/20 0109  HGBA1C 9.9*   Fasting Lipid Panel Recent Labs    08/04/20 0109  CHOL 347*  HDL 39*  LDLCALC UNABLE TO CALCULATE IF TRIGLYCERIDE OVER 400 mg/dL  TRIG 425*  CHOLHDL 8.9  LDLDIRECT 206.7*   Thyroid Function Tests No results for input(s): TSH, T4TOTAL, T3FREE, THYROIDAB in the last 72 hours.  Invalid input(s): FREET3 _____________  CARDIAC CATHETERIZATION  Result Date: 08/04/2020  Prox LAD lesion is 60% stenosed.  Mid LAD lesion is 50% stenosed.  1st Diag lesion is 80% stenosed.  Ramus lesion is 50% stenosed.  Mid Cx to Dist Cx lesion is 100% stenosed.  Prox RCA to Mid RCA lesion is 80% stenosed.  Dist RCA lesion is 50% stenosed.  RPDA lesion is 40% stenosed.  LPAV lesion is 80% stenosed.  3rd LPL lesion is 90% stenosed.  1st LPL lesion is 50% stenosed.  A drug-eluting stent was successfully placed using a STENT RESOLUTE ONYX 3.5X18.  Post intervention, there is a 0% residual stenosis.  LV end diastolic pressure is normal.  1. Diffusely diseased diabetic coronary arteries with acute occlusion of the mid to distal LCx. This is a codominant vessel. Otherwise moderate 3 vessel disease. 2. Normal LVEDP 3. Successful PCI of the mid LCx with overlapping DES x 2 Plan: DAPT for at least one year. Aggressive risk factor modification and antianginal therapy. At this point would treat her residual disease medically. If she should have refractory angina in the future would need to be considered for CABG.   DG Chest Portable 1 View  Result Date: 08/04/2020 CLINICAL DATA:  Code STEMI EXAM: PORTABLE CHEST 1 VIEW COMPARISON:  None. FINDINGS: The heart size and mediastinal contours are within normal  limits. Both lungs are clear. The visualized skeletal structures are unremarkable. IMPRESSION: No active disease. Electronically Signed   By: Prudencio Pair M.D.   On: 08/04/2020 01:26  ECHOCARDIOGRAM COMPLETE  Result Date: 08/05/2020    ECHOCARDIOGRAM REPORT   Patient Name:   Casey Acosta Date of Exam: 08/05/2020 Medical Rec #:  [id number]        Height:       66.0 in Accession #:    [id number]       Weight:       198.2 lb Date of Birth:  [birth year]        BSA:          1.992 m Patient Age:    49 years         BP:           137/77 mmHg Patient Gender: F                HR:           84 bpm. Exam Location:  Inpatient Procedure: 2D Echo, Cardiac Doppler and Color Doppler Indications:    Acute coronary syndrome  History:        Patient has prior history of Echocardiogram examinations, most                 recent 05/27/2020. Signs/Symptoms:Dyspnea and Syncope; Risk                 Factors:Hypertension, Diabetes and Dyslipidemia. Cardiomyopathy.                 CKD. LVH.  Sonographer:    Clayton Lefort RDCS (AE) Referring Phys: [id number] [place]  1. There is a small mass (0.6 cm x 0.5 cm) attached to the [facility] of the aortic valve. The lesion is not freely mobile and moves with the leaflet. Recent TEE reported this to be nodular thickening and not a papillary fibroelastoma. The aortic valve is tricuspid. Aortic valve regurgitation is not visualized. No aortic stenosis is present.  2. Left ventricular ejection fraction, by estimation, is 55 to 60%. The left ventricle has normal function. The left ventricle has no regional wall motion abnormalities. There is severe concentric left ventricular hypertrophy. Left ventricular diastolic  parameters are consistent with Grade II diastolic dysfunction (pseudonormalization). Elevated left atrial pressure. The E/e' is 19.0.  3. Right ventricular systolic function is normal. The right ventricular size is normal. Tricuspid regurgitation signal is inadequate for  assessing PA pressure.  4. A small pericardial effusion is present. The pericardial effusion is circumferential. There is no evidence of cardiac tamponade.  5. The mitral valve is grossly normal. Mild mitral valve regurgitation. No evidence of mitral stenosis.  6. The inferior vena cava is normal in size with greater than 50% respiratory variability, suggesting right atrial pressure of 3 mmHg. Comparison(s): No significant change from prior study. FINDINGS  Left Ventricle: Left ventricular ejection fraction, by estimation, is 55 to 60%. The left ventricle has normal function. The left ventricle has no regional wall motion abnormalities. The left ventricular internal cavity size was normal in size. There is  severe concentric left ventricular hypertrophy. Left ventricular diastolic parameters are consistent with Grade II diastolic dysfunction (pseudonormalization). Elevated left atrial pressure. The E/e' is 19.0. Right Ventricle: The right ventricular size is normal. No increase in right ventricular wall thickness. Right ventricular systolic function is normal. Tricuspid regurgitation signal is inadequate for assessing PA pressure. Left Atrium: Left atrial size was normal in size. Right Atrium: Right atrial size was normal in size. Pericardium: A small pericardial effusion is present. The pericardial effusion is circumferential. There is no evidence of cardiac  tamponade. Mitral Valve: The mitral valve is grossly normal. Mild mitral valve regurgitation. No evidence of mitral valve stenosis. MV peak gradient, 4.2 mmHg. The mean mitral valve gradient is 2.0 mmHg. Tricuspid Valve: The tricuspid valve is grossly normal. Tricuspid valve regurgitation is trivial. No evidence of tricuspid stenosis. Aortic Valve: There is a small mass (0.6 cm x 0.5 cm) attached to the [facility] of the aortic valve. The lesion is not freely mobile and moves with the leaflet. Recent TEE reported this to be nodular thickening and not a papillary  fibroelastoma. The aortic valve is tricuspid. Aortic valve regurgitation is not visualized. No aortic stenosis is present. Aortic valve mean gradient measures 6.0 mmHg. Aortic valve peak gradient measures 10.2 mmHg. Aortic valve area, by VTI measures 2.06 cm. Pulmonic Valve: The pulmonic valve was grossly normal. Pulmonic valve regurgitation is not visualized. No evidence of pulmonic stenosis. Aorta: The aortic root and ascending aorta are structurally normal, with no evidence of dilitation. Venous: The right upper pulmonary vein is normal. The inferior vena cava is normal in size with greater than 50% respiratory variability, suggesting right atrial pressure of 3 mmHg. IAS/Shunts: The atrial septum is grossly normal.  LEFT VENTRICLE PLAX 2D LVIDd:         4.10 cm      Diastology LVIDs:         2.90 cm      LV e' medial:    4.60 cm/s LV PW:         2.30 cm      LV E/e' medial:  19.0 LV IVS:        2.00 cm      LV e' lateral:   4.60 cm/s LVOT diam:     2.10 cm      LV E/e' lateral: 19.0 LV SV:         59 LV SV Index:   30 LVOT Area:     3.46 cm                              3D Volume EF: LV Volumes (MOD)            3D EF:        44 % LV vol d, MOD A4C: 107.0 ml LV EDV:       174 ml LV SV MOD A4C:     107.0 ml LV ESV:       98 ml                             LV SV:        76 ml RIGHT VENTRICLE             IVC RV Basal diam:  3.00 cm     IVC diam: 2.10 cm RV S prime:     17.70 cm/s TAPSE (M-mode): 2.5 cm LEFT ATRIUM             Index       RIGHT ATRIUM           Index LA diam:        4.80 cm 2.41 cm/m  RA Area:     13.60 cm LA Vol (A2C):   54.6 ml 27.41 ml/m RA Volume:   29.40 ml  14.76 ml/m LA Vol (A4C):   42.6 ml 21.38 ml/m LA Biplane Vol: 49.3 ml 24.75  ml/m  AORTIC VALVE AV Area (Vmax):    2.14 cm AV Area (Vmean):   2.03 cm AV Area (VTI):     2.06 cm AV Vmax:           160.00 cm/s AV Vmean:          119.000 cm/s AV VTI:            0.287 m AV Peak Grad:      10.2 mmHg AV Mean Grad:      6.0 mmHg LVOT Vmax:          98.80 cm/s LVOT Vmean:        69.800 cm/s LVOT VTI:          0.171 m LVOT/AV VTI ratio: 0.60  AORTA Ao Root diam: 3.50 cm Ao Asc diam:  3.40 cm MITRAL VALVE MV Area (PHT): 2.62 cm    SHUNTS MV Peak grad:  4.2 mmHg    Systemic VTI:  0.17 m MV Mean grad:  2.0 mmHg    Systemic Diam: 2.10 cm MV Vmax:       1.03 m/s MV Vmean:      59.2 cm/s MV Decel Time: 289 msec MV E velocity: 87.20 cm/s MV A velocity: 94.90 cm/s MV E/A ratio:  0.92 Eleonore Chiquito MD Electronically signed by Eleonore Chiquito MD Signature Date/Time: 08/05/2020/12:26:11 PM    Final    Disposition   Pt is being discharged home today in good condition.  Follow-up Plans & Appointments     Follow-up Information    Erlene Quan, PA-C Follow up on 08/14/2020.   Specialties: Cardiology, Radiology Why: at 2:15pm for your follow up appt Contact information: [place] [address] [address] Follow up on 08/08/2020.   Specialty: Cardiology Why: Please come in for follow up labs between 8:30-3:30pm Contact information: [address] [place] [phone]             Discharge Instructions    Amb Referral to Cardiac Rehabilitation   Complete by: As directed    Diagnosis:  Coronary Stents STEMI PTCA     After initial evaluation and assessments completed: Virtual Based Care may be provided alone or in conjunction with Phase 2 Cardiac Rehab based on patient barriers.: Yes   Diet - low sodium heart healthy   Complete by: As directed    Discharge instructions   Complete by: As directed    Radial Site Care Refer to this sheet in the next few weeks. These instructions provide you with information on caring for yourself after your procedure. Your caregiver may also give you more specific instructions. Your treatment has been planned according to current medical practices, but problems sometimes occur. Call your caregiver if you  have any problems or questions after your procedure. HOME CARE INSTRUCTIONS You may shower the day after the procedure.Remove the bandage (dressing) and gently wash the site with plain soap and water.Gently pat the site dry.  Do not apply powder or lotion to the site.  Do not submerge the affected site in water for 3 to 5 days.  Inspect the site at least twice daily.  Do not flex or bend the affected arm for 24 hours.  No lifting over 5 pounds (2.3 kg) for 5 days after your procedure.  Do not drive home if you are discharged the same day of the procedure. Have someone else  drive you.  You may drive 24 hours after the procedure unless otherwise instructed by your caregiver.  What to expect: Any bruising will usually fade within 1 to 2 weeks.  Blood that collects in the tissue (hematoma) may be painful to the touch. It should usually decrease in size and tenderness within 1 to 2 weeks.  SEEK IMMEDIATE MEDICAL CARE IF: You have unusual pain at the radial site.  You have redness, warmth, swelling, or pain at the radial site.  You have drainage (other than a small amount of blood on the dressing).  You have chills.  You have a fever or persistent symptoms for more than 72 hours.  You have a fever and your symptoms suddenly get worse.  Your arm becomes pale, cool, tingly, or numb.  You have heavy bleeding from the site. Hold pressure on the site.   PLEASE DO NOT MISS ANY DOSES OF YOUR BRILINTA!!!!! Also keep a log of you blood pressures and bring back to your follow up appt. Please call the office with any questions.   Patients taking blood thinners should generally stay away from medicines like ibuprofen, Advil, Motrin, naproxen, and Aleve due to risk of stomach bleeding. You may take Tylenol as directed or talk to your primary doctor about alternatives. Marland Kitchen  PLEASE ENSURE THAT YOU DO NOT RUN OUT OF YOUR BRILINTA This medication is very important to remain on for at least one year. IF you  have issues obtaining this medication due to cost please CALL the office 3-5 business days prior to running out in order to prevent missing doses of this medication.   Increase activity slowly   Complete by: As directed       Discharge Medications   Allergies as of 08/06/2020      Reactions   Dilaudid [hydromorphone] Nausea And Vomiting   Prozac [fluoxetine Hcl]    hallucinations      Medication List    STOP taking these medications   irbesartan 300 MG tablet Commonly known as: AVAPRO   metFORMIN 500 MG tablet Commonly known as: GLUCOPHAGE   spironolactone 25 MG tablet Commonly known as: ALDACTONE     TAKE these medications   acetaminophen 500 MG tablet Commonly known as: TYLENOL Take 1,000 mg by mouth every 6 (six) hours as needed for mild pain, moderate pain, fever or headache.   amLODipine 10 MG tablet Commonly known as: NORVASC Take 1 tablet (10 mg total) by mouth daily.   aspirin EC 81 MG tablet Take 81 mg by mouth daily.   blood glucose meter kit and supplies Kit Dispense based on patient and insurance preference. Use up to four times daily as directed.   carvedilol 25 MG tablet Commonly known as: COREG Take 1 tablet (25 mg total) by mouth 2 (two) times daily with a meal.   cloNIDine 0.1 mg/24hr patch Commonly known as: CATAPRES - Dosed in mg/24 hr Place 1 patch (0.1 mg total) onto the skin once a week.   glimepiride 1 MG tablet Commonly known as: Amaryl Take 2 tablets (2 mg total) by mouth daily with breakfast.   hydrALAZINE 100 MG tablet Commonly known as: APRESOLINE Take 1 tablet (100 mg total) by mouth 4 (four) times daily.   hydrOXYzine 50 MG tablet Commonly known as: ATARAX/VISTARIL TAKE ONE TABLET BY MOUTH THREE TIMES A DAY AS NEEDED What changed:   when to take this  reasons to take this   nitroGLYCERIN 0.4 MG SL tablet Commonly known as:  Nitrostat Place 1 tablet (0.4 mg total) under the tongue every 5 (five) minutes as needed.    Ozempic (0.25 or 0.5 MG/DOSE) 2 MG/1.5ML Sopn Generic drug: Semaglutide(0.25 or 0.5MG /DOS) Inject 0.5 mg into the skin once a week.   rosuvastatin 40 MG tablet Commonly known as: CRESTOR Take 1 tablet (40 mg total) by mouth daily. Start taking on: August 07, 2020 What changed:   medication strength  how much to take   sertraline 25 MG tablet Commonly known as: Zoloft Take 1 tablet (25 mg total) by mouth daily.   ticagrelor 90 MG Tabs tablet Commonly known as: BRILINTA Take 1 tablet (90 mg total) by mouth 2 (two) times daily.          Outstanding Labs/Studies   BMET 12/9  Duration of Discharge Encounter   Greater than 30 minutes including physician time.  Signed, Reino Bellis, NP 08/06/2020, 1:21 PM

## 2020-08-06 NOTE — Progress Notes (Signed)
CARDIAC REHAB PHASE I   PRE:  Rate/Rhythm: 80 SR    BP: sitting 134/74    SaO2:   MODE:  Ambulation: 400 ft   POST:  Rate/Rhythm: 91 SR    BP: sitting 141/79     SaO2:   Pt ambulated with slow pace. Some SOB, had to rest x1. Groin sore. VSS. Reviewed ed and discussed diet changes, exercise and NTG. Pt receptive. She would like clarification of med changes and TOC med-to-bed. She has Brilinta coupon. Eager to d/c.   [phone]  [place], ACSM 08/06/2020 10:36 AM

## 2020-08-06 NOTE — TOC Transition Note (Signed)
Transition of Care [facility]) - CM/SW Discharge Note   Patient Details  Name: Casey Acosta MRN: [id number] Date of Birth: 12-Jun-1971  [facility]) CM/SW Contact:  Zenon Mayo, RN Phone Number: 08/06/2020, 1:50 PM   Clinical Narrative:    Patient is for dc today, brilinta has zero dollar copay per [place].  NCM received consult to check vescepa, but patient will not be on this.          Patient Goals and CMS Choice        Discharge Placement                       Discharge Plan and Services                                     Social Determinants of Health (SDOH) Interventions     Readmission Risk Interventions No flowsheet data found.

## 2020-08-06 NOTE — TOC Benefit Eligibility Note (Signed)
Transition of Care [facility]) Benefit Eligibility Note    Patient Details  Name: Casey Acosta MRN: [id number] Date of Birth: May 18, 1971   Medication/Dose: Brilinta 90 mg bid  Covered?: Yes  Tier: 3 Drug  Prescription Coverage Preferred Pharmacy: patient can use any pharmacy  Spoke with Person/Company/Phone Number:: Arloa Koh [employer] [phone]  Co-Pay: patient is responsible for 50% co-insurance after deductible  Prior Approval: No (unless she will exceed quantity limit of 2 per day)  Deductible: Met ($7200 which has been met this calendar year)       Delorse Lek Phone Number: 08/06/2020, 7:26 AM

## 2020-08-07 ENCOUNTER — Telehealth (HOSPITAL_COMMUNITY): Payer: Self-pay

## 2020-08-07 NOTE — Telephone Encounter (Signed)
Attempted to call patient in regards to Cardiac Rehab - LM on VM 

## 2020-08-07 NOTE — Telephone Encounter (Signed)
Will verify pt insurance at the beginning of the year. 

## 2020-08-08 ENCOUNTER — Telehealth: Payer: Self-pay | Admitting: Family Medicine

## 2020-08-08 ENCOUNTER — Other Ambulatory Visit: Payer: Self-pay

## 2020-08-08 DIAGNOSIS — Z79899 Other long term (current) drug therapy: Secondary | ICD-10-CM

## 2020-08-08 NOTE — Telephone Encounter (Signed)
Transition Care Management Unsuccessful Follow-up Telephone Call  Date of discharge and from where:  08/06/2020 from [facility]   Attempts:  1st Attempt  Reason for unsuccessful TCM follow-up call:  Left voice message

## 2020-08-09 LAB — BASIC METABOLIC PANEL
BUN/Creatinine Ratio: 9 (ref 9–23)
BUN: 23 mg/dL (ref 6–24)
CO2: 17 mmol/L — ABNORMAL LOW (ref 20–29)
Calcium: 9.4 mg/dL (ref 8.7–10.2)
Chloride: 103 mmol/L (ref 96–106)
Creatinine, Ser: 2.44 mg/dL — ABNORMAL HIGH (ref 0.57–1.00)
GFR calc Af Amer: 26 mL/min/{1.73_m2} — ABNORMAL LOW (ref >59)
GFR calc non Af Amer: 22 mL/min/{1.73_m2} — ABNORMAL LOW (ref >59)
Glucose: 144 mg/dL — ABNORMAL HIGH (ref 65–99)
Potassium: 4.8 mmol/L (ref 3.5–5.2)
Sodium: 136 mmol/L (ref 134–144)

## 2020-08-14 ENCOUNTER — Encounter: Payer: Self-pay | Admitting: Cardiology

## 2020-08-14 ENCOUNTER — Ambulatory Visit: Payer: 59 | Admitting: Cardiology

## 2020-08-14 ENCOUNTER — Other Ambulatory Visit: Payer: Self-pay

## 2020-08-14 VITALS — BP 131/81 | HR 80 | Ht 66.0 in | Wt 203.8 lb

## 2020-08-14 DIAGNOSIS — IMO0002 Reserved for concepts with insufficient information to code with codable children: Secondary | ICD-10-CM

## 2020-08-14 DIAGNOSIS — E1165 Type 2 diabetes mellitus with hyperglycemia: Secondary | ICD-10-CM

## 2020-08-14 DIAGNOSIS — I10 Essential (primary) hypertension: Secondary | ICD-10-CM

## 2020-08-14 DIAGNOSIS — N184 Chronic kidney disease, stage 4 (severe): Secondary | ICD-10-CM

## 2020-08-14 DIAGNOSIS — I2510 Atherosclerotic heart disease of native coronary artery without angina pectoris: Secondary | ICD-10-CM | POA: Insufficient documentation

## 2020-08-14 DIAGNOSIS — I2121 ST elevation (STEMI) myocardial infarction involving left circumflex coronary artery: Secondary | ICD-10-CM

## 2020-08-14 DIAGNOSIS — I358 Other nonrheumatic aortic valve disorders: Secondary | ICD-10-CM

## 2020-08-14 MED ORDER — ROSUVASTATIN CALCIUM 40 MG PO TABS: 40.0000 mg | ORAL_TABLET | Freq: Every day | ORAL | 3 refills | Status: DC

## 2020-08-14 MED ORDER — HYDRALAZINE HCL 100 MG PO TABS: 100.0000 mg | ORAL_TABLET | Freq: Four times a day (QID) | ORAL | 3 refills | Status: DC

## 2020-08-14 MED ORDER — AMLODIPINE BESYLATE 10 MG PO TABS: 10.0000 mg | ORAL_TABLET | Freq: Every day | ORAL | 3 refills | Status: DC

## 2020-08-14 NOTE — Assessment & Plan Note (Addendum)
GFR 26 after cath/PCI Dec 2021- check BMP next week. I referred her to [facility].  She remains off Jardiance and Glucophage.  I would not resume Aldactone unless renal function improves significantly

## 2020-08-14 NOTE — Assessment & Plan Note (Signed)
Evaluated with TEE Sept 2021

## 2020-08-14 NOTE — Assessment & Plan Note (Signed)
Admitted 12/05-12/02/2020- Trop > 27k

## 2020-08-14 NOTE — Assessment & Plan Note (Signed)
Problems with medication compliance in the past  Grade 2 DD on echo

## 2020-08-14 NOTE — Assessment & Plan Note (Signed)
Hgb A1c- 9.9-f/u with PCP

## 2020-08-14 NOTE — Assessment & Plan Note (Signed)
CFX PCI with DES 08/04/2020.  She has moderate residual CAD with 80% proximal to mid RCA, 50-60% LAD-normal LVF by echo

## 2020-08-14 NOTE — Patient Instructions (Addendum)
Medication Instructions:  Continue current medications  *If you need a refill on your cardiac medications before your next appointment, please call your pharmacy*   Lab Work: [facility] Tuesday December 21st  If you have labs (blood work) drawn today and your tests are completely normal, you will receive your results only by: Marland Kitchen MyChart Message (if you have MyChart) OR . A paper copy in the mail If you have any lab test that is abnormal or we need to change your treatment, we will call you to review the results.   Testing/Procedures: None Ordered   Follow-Up: At [facility], you and your health needs are our priority.  As part of our continuing mission to provide you with exceptional heart care, we have created designated Provider Care Teams.  These Care Teams include your primary Cardiologist (physician) and Advanced Practice Providers (APPs -  Physician Assistants and Nurse Practitioners) who all work together to provide you with the care you need, when you need it.  We recommend signing up for the patient portal called "MyChart".  Sign up information is provided on this After Visit Summary.  MyChart is used to connect with patients for Virtual Visits (Telemedicine).  Patients are able to view lab/test results, encounter notes, upcoming appointments, etc.  Non-urgent messages can be sent to your provider as well.   To learn more about what you can do with MyChart, go to [email or web address].    Your next appointment:   4-6 week(s)  The format for your next appointment:   In Person  Provider:   You may see Kirk Ruths, MD or one of the following Advanced Practice Providers on your designated Care Team:    Kerin Ransom, PA-C  [place], [place]  Coletta Memos, FNP    Other Instructions You have been referred to [facility]

## 2020-08-14 NOTE — Progress Notes (Signed)
Cardiology Office Note:    Date:  08/14/2020   ID:  Juanda Bond, DOB 1971-01-28, MRN [id number]  PCP:  Billie Ruddy, MD  Cardiologist:  Kirk Ruths, MD  Electrophysiologist:  None   Referring MD: Billie Ruddy, MD   No chief complaint on file. Post hospital f/u  History of Present Illness:    Casey Acosta is a pleasant 49 y.o. female who works as an Therapist, sports at [employer]. She has a hx of insulin-dependent diabetes, hypertension, chronic renal insufficiency, and dyslipidemia.  She was seen by cardiology in September 2021 when she presented with uncontrolled hypertension with an elevated troponin and chest pain.  It was felt her troponin was secondary to demand ischemia, she had not been compliant with her medications.  An echocardiogram suggested a possible mass on her aortic valve, she did have a TEE to evaluate this.  A Myoview was done and was felt to be low risk.    She then presented 08/04/2020 again off her medications.  This time she ruled in for a NSTEMI.  At catheterization she had diffuse coronary disease.  Culprit vessel was felt to be the circumflex which was stented.  She has residual disease in the RCA and LAD of moderate degree.  Echocardiogram showed preserved LV function.  Her creatinine was 2.5, on 08/08/2020 it had come down to 2.4.  Patient is in the office today for follow-up.  She says overall she feels a lot better.  Her blood pressures under fair control, the nurse got 131/81, I got 138/80.  She has not had recurrent chest pain and reports she is not having any issues with her medications.  She asked if she could return to work sooner rather than later.  Past Medical History:  Diagnosis Date  . Anemia   . Anxiety   . Anxiety and depression   . Chronic kidney disease   . Colon polyp   . Diabetes mellitus without complication ([place])   . Dysuria   . High risk sexual behavior   . History of syncope   . Hyperlipidemia   . Hypertension   . Internal  hemorrhoids   . LVH (left ventricular hypertrophy)   . Malaise and fatigue   . Obesity (BMI 30-39.9)   . Ovarian mass   . Panic disorder   . PCOS (polycystic ovarian syndrome)   . Rectal bleeding   . Shortness of breath   . Surgical menopause   . Vaginal trichomoniasis     Past Surgical History:  Procedure Laterality Date  . ABDOMINAL HYSTERECTOMY     partial  . APPENDECTOMY    . CHOLECYSTECTOMY    . CORONARY/GRAFT ACUTE MI REVASCULARIZATION N/A 08/04/2020   Procedure: Coronary/Graft Acute MI Revascularization;  Surgeon: Martinique, Peter M, MD;  Location: [place] [facility];  Service: Cardiovascular;  Laterality: N/A;  . LEFT HEART CATH AND CORONARY ANGIOGRAPHY N/A 08/04/2020   Procedure: LEFT HEART CATH AND CORONARY ANGIOGRAPHY;  Surgeon: Martinique, Peter M, MD;  Location: [place] [facility];  Service: Cardiovascular;  Laterality: N/A;  . TEE WITHOUT CARDIOVERSION N/A 05/27/2020   Procedure: TRANSESOPHAGEAL ECHOCARDIOGRAM (TEE);  Surgeon: Josue Hector, MD;  Location: [facility];  Service: Cardiovascular;  Laterality: N/A;  . TUBAL LIGATION      Current Medications: Current Meds  Medication Sig  . acetaminophen (TYLENOL) 500 MG tablet Take 1,000 mg by mouth every 6 (six) hours as needed for mild pain, moderate pain, fever or headache.  Marland Kitchen amLODipine (NORVASC)  10 MG tablet Take 1 tablet (10 mg total) by mouth daily.  Marland Kitchen aspirin EC 81 MG tablet Take 81 mg by mouth daily.  . blood glucose meter kit and supplies KIT Dispense based on patient and insurance preference. Use up to four times daily as directed.  . carvedilol (COREG) 25 MG tablet Take 1 tablet (25 mg total) by mouth 2 (two) times daily with a meal.  . cloNIDine (CATAPRES - DOSED IN MG/24 HR) 0.1 mg/24hr patch Place 1 patch (0.1 mg total) onto the skin once a week.  Marland Kitchen glimepiride (AMARYL) 1 MG tablet Take 2 tablets (2 mg total) by mouth daily with breakfast.  . hydrALAZINE (APRESOLINE) 100 MG tablet Take 1 tablet (100 mg total)  by mouth 4 (four) times daily.  . hydrOXYzine (ATARAX/VISTARIL) 50 MG tablet TAKE ONE TABLET BY MOUTH THREE TIMES A DAY AS NEEDED (Patient taking differently: Take 50 mg by mouth every 6 (six) hours as needed for anxiety.)  . nitroGLYCERIN (NITROSTAT) 0.4 MG SL tablet Place 1 tablet (0.4 mg total) under the tongue every 5 (five) minutes as needed.  . rosuvastatin (CRESTOR) 40 MG tablet Take 1 tablet (40 mg total) by mouth daily.  . Semaglutide,0.25 or 0.5MG /DOS, (OZEMPIC, 0.25 OR 0.5 MG/DOSE,) 2 MG/1.5ML SOPN Inject 0.5 mg into the skin once a week.  . sertraline (ZOLOFT) 25 MG tablet Take 1 tablet (25 mg total) by mouth daily.  . ticagrelor (BRILINTA) 90 MG TABS tablet Take 1 tablet (90 mg total) by mouth 2 (two) times daily.     Allergies:   Dilaudid [hydromorphone] and Prozac [fluoxetine hcl]   Social History   Socioeconomic History  . Marital status: Significant Other    Spouse name: Not on file  . Number of children: Not on file  . Years of education: Not on file  . Highest education level: Not on file  Occupational History  . Not on file  Tobacco Use  . Smoking status: Never Smoker  . Smokeless tobacco: Never Used  Vaping Use  . Vaping Use: Never used  Substance and Sexual Activity  . Alcohol use: Yes    Comment: Occas. wine  . Drug use: No  . Sexual activity: Yes    Birth control/protection: Surgical    Comment: HYST.-1st intercourse 49 yo-More than 5 partners  Other Topics Concern  . Not on file  Social History Narrative  . Not on file   Social Determinants of Health   Financial Resource Strain: Not on file  Food Insecurity: Not on file  Transportation Needs: Not on file  Physical Activity: Not on file  Stress: Not on file  Social Connections: Not on file     Family History: The patient's family history includes ALS in her father; Anxiety disorder in her mother; Crohn's disease in her brother and sister; Depression in her mother; Diabetes in her mother and  sister; Hyperlipidemia in her mother; Hypertension in her mother and sister; Thyroid disease in her mother.  ROS:   Please see the history of present illness.     All other systems reviewed and are negative.  EKGs/Labs/Other Studies Reviewed:    The following studies were reviewed today: Cath/PCI 08/04/2020-  Prox LAD lesion is 60% stenosed.  Mid LAD lesion is 50% stenosed.  1st Diag lesion is 80% stenosed.  Ramus lesion is 50% stenosed.  Mid Cx to Dist Cx lesion is 100% stenosed.  Prox RCA to Mid RCA lesion is 80% stenosed.  Dist RCA lesion  is 50% stenosed.  RPDA lesion is 40% stenosed.  LPAV lesion is 80% stenosed.  3rd LPL lesion is 90% stenosed.  1st LPL lesion is 50% stenosed.  A drug-eluting stent was successfully placed using a STENT RESOLUTE ONYX 3.5X18.  Post intervention, there is a 0% residual stenosis.  LV end diastolic pressure is normal.   1. Diffusely diseased diabetic coronary arteries with acute occlusion of the mid to distal LCx. This is a codominant vessel. Otherwise moderate 3 vessel disease. 2. Normal LVEDP 3. Successful PCI of the mid LCx with overlapping DES x 2  Plan: DAPT for at least one year. Aggressive risk factor modification and antianginal therapy. At this point would treat her residual disease medically. If she should have refractory angina in the future would need to be considered for CABG.   Echo 08/05/2020- There is a small mass (0.6 cm x 0.5 cm) attached to the [facility] of the aortic valve. The lesion is not freely mobile and moves with the leaflet. Recent TEE reported this to be nodular thickening and not a papillary fibroelastoma. The aortic valve is tricuspid. Aortic valve regurgitation is not visualized. No aortic stenosis is present. 2. Left ventricular ejection fraction, by estimation, is 55 to 60%. The left ventricle has normal function. The left ventricle has no regional wall motion abnormalities. There is severe concentric  left ventricular hypertrophy. Left ventricular diastolic parameters are consistent with Grade II diastolic dysfunction (pseudonormalization). Elevated left atrial pressure. The E/e' is 19.0. 3. Right ventricular systolic function is normal. The right ventricular size is normal. Tricuspid regurgitation signal is inadequate for assessing PA pressure. 4. A small pericardial effusion is present. The pericardial effusion is circumferential. There is no evidence of cardiac tamponade. 5. The mitral valve is grossly normal. Mild mitral valve regurgitation. No evidence of mitral stenosis. 6. The inferior vena cava is normal in size with greater than 50% respiratory variability, suggesting right atrial pressure of 3 mmHg. Comparison(s): No significant change from prior study.  EKG:  EKG is ordered today.  The ekg ordered today demonstrates NSR, NSST changes  Recent Labs: 05/24/2020: B Natriuretic Peptide 166.1 05/27/2020: TSH 1.089; TSH 1.148 08/04/2020: ALT 17; Magnesium 1.8 08/06/2020: Hemoglobin 9.3; Platelets 308 08/08/2020: BUN 23; Creatinine, Ser 2.44; Potassium 4.8; Sodium 136  Recent Lipid Panel    Component Value Date/Time   CHOL 347 (H) 08/04/2020 0109   TRIG 425 (H) 08/04/2020 0109   HDL 39 (L) 08/04/2020 0109   CHOLHDL 8.9 08/04/2020 0109   VLDL UNABLE TO CALCULATE IF TRIGLYCERIDE OVER 400 mg/dL 08/04/2020 0109   LDLCALC UNABLE TO CALCULATE IF TRIGLYCERIDE OVER 400 mg/dL 08/04/2020 0109   LDLDIRECT 206.7 (H) 08/04/2020 0109    Physical Exam:    VS:  BP 131/81   Pulse 80   Ht $R'5\' 6"'Dz$  (1.676 m)   Wt 203 lb 12.8 oz (92.4 kg)   SpO2 97%   BMI 32.89 kg/m     Wt Readings from Last 3 Encounters:  08/14/20 203 lb 12.8 oz (92.4 kg)  08/06/20 200 lb 4.8 oz (90.9 kg)  07/12/20 207 lb (93.9 kg)     GEN: Overweight AA female, well developed in no acute distress HEENT: Normal NECK: No JVD; No carotid bruits CARDIAC: RRR, no murmurs, rubs, gallops RESPIRATORY:  Clear to auscultation  without rales, wheezing or rhonchi  ABDOMEN: Soft, non-tender, non-distended MUSCULOSKELETAL:  No edema; No deformity  SKIN: Warm and dry NEUROLOGIC:  Alert and oriented x 3 PSYCHIATRIC:  Normal affect  ASSESSMENT:    STEMI involving left circumflex coronary artery [facility]) Admitted 12/05-12/02/2020- Trop > 27k  CAD S/P percutaneous coronary angioplasty CFX PCI with DES 08/04/2020.  She has moderate residual CAD with 80% proximal to mid RCA, 50-60% LAD-normal LVF by echo  Essential hypertension Problems with medication compliance in the past  Grade 2 DD on echo  CKD (chronic kidney disease) stage 4, GFR 15-29 ml/min (HCC) GFR 26 after cath/PCI Dec 2021- check BMP next week. I referred her to [facility].  She remains off Jardiance and Glucophage.  I would not resume Aldactone unless renal function improves significantly  Aortic valve mass Evaluated with TEE Sept 2021  Uncontrolled type 2 diabetes mellitus with diabetic nephropathy, without long-term current use of insulin (HCC) Hgb A1c- 9.9-f/u with PCP  PLAN:    Referral to nephrology. F/U BMP next week.  OK to return to work 12/27 as long as she is feeling well.  F/U with Dr Stanford Breed or myself 4-6 weeks.   Meds refilled for 1 year.    Medication Adjustments/Labs and Tests Ordered: Current medicines are reviewed at length with the patient today.  Concerns regarding medicines are outlined above.  Orders Placed This Encounter  Procedures  . Basic Metabolic Panel (BMET)  . Ambulatory referral to Nephrology  . EKG 12-Lead   No orders of the defined types were placed in this encounter.   Patient Instructions  Medication Instructions:  Continue current medications  *If you need a refill on your cardiac medications before your next appointment, please call your pharmacy*   Lab Work: [facility] Tuesday December 21st  If you have labs (blood work) drawn today and your tests are completely normal, you will receive your  results only by: Marland Kitchen MyChart Message (if you have MyChart) OR . A paper copy in the mail If you have any lab test that is abnormal or we need to change your treatment, we will call you to review the results.   Testing/Procedures: None Ordered   Follow-Up: At [facility], you and your health needs are our priority.  As part of our continuing mission to provide you with exceptional heart care, we have created designated Provider Care Teams.  These Care Teams include your primary Cardiologist (physician) and Advanced Practice Providers (APPs -  Physician Assistants and Nurse Practitioners) who all work together to provide you with the care you need, when you need it.  We recommend signing up for the patient portal called "MyChart".  Sign up information is provided on this After Visit Summary.  MyChart is used to connect with patients for Virtual Visits (Telemedicine).  Patients are able to view lab/test results, encounter notes, upcoming appointments, etc.  Non-urgent messages can be sent to your provider as well.   To learn more about what you can do with MyChart, go to [email or web address].    Your next appointment:   4-6 week(s)  The format for your next appointment:   In Person  Provider:   You may see Kirk Ruths, MD or one of the following Advanced Practice Providers on your designated Care Team:    Kerin Ransom, PA-C  [place], [place]  Coletta Memos, FNP    Other Instructions You have been referred to [place], Kerin Ransom, Hershal Coria  08/14/2020 2:38 PM    [place]

## 2020-08-21 ENCOUNTER — Telehealth (HOSPITAL_COMMUNITY): Payer: Self-pay

## 2020-08-21 ENCOUNTER — Encounter (HOSPITAL_COMMUNITY): Payer: Self-pay

## 2020-08-21 NOTE — Telephone Encounter (Signed)
Attempted to call patient in regards to Cardiac Rehab - LM on VM Mailed letter 

## 2020-08-29 ENCOUNTER — Encounter: Payer: Self-pay | Admitting: Family Medicine

## 2020-09-01 ENCOUNTER — Other Ambulatory Visit: Payer: Self-pay

## 2020-09-02 ENCOUNTER — Encounter: Payer: Self-pay | Admitting: *Deleted

## 2020-09-02 MED ORDER — BLOOD GLUCOSE MONITOR KIT
PACK | 0 refills | Status: DC
Start: 2020-09-02 — End: 2021-01-02

## 2020-09-04 ENCOUNTER — Telehealth (HOSPITAL_COMMUNITY): Payer: Self-pay

## 2020-09-04 NOTE — Telephone Encounter (Signed)
No response from pt in regards to cardiac rehab. Closed referral 

## 2020-09-06 ENCOUNTER — Encounter (HOSPITAL_COMMUNITY): Payer: Self-pay | Admitting: Cardiology

## 2020-09-09 ENCOUNTER — Ambulatory Visit (INDEPENDENT_AMBULATORY_CARE_PROVIDER_SITE_OTHER): Payer: 59 | Admitting: Family Medicine

## 2020-09-09 ENCOUNTER — Encounter: Payer: Self-pay | Admitting: Family Medicine

## 2020-09-09 ENCOUNTER — Other Ambulatory Visit: Payer: Self-pay

## 2020-09-09 VITALS — BP 140/82 | HR 84 | Temp 98.2°F | Wt 203.6 lb

## 2020-09-09 DIAGNOSIS — M5442 Lumbago with sciatica, left side: Secondary | ICD-10-CM | POA: Diagnosis not present

## 2020-09-09 DIAGNOSIS — I10 Essential (primary) hypertension: Secondary | ICD-10-CM | POA: Diagnosis not present

## 2020-09-09 DIAGNOSIS — I2121 ST elevation (STEMI) myocardial infarction involving left circumflex coronary artery: Secondary | ICD-10-CM

## 2020-09-09 DIAGNOSIS — E1122 Type 2 diabetes mellitus with diabetic chronic kidney disease: Secondary | ICD-10-CM

## 2020-09-09 DIAGNOSIS — M5441 Lumbago with sciatica, right side: Secondary | ICD-10-CM

## 2020-09-09 DIAGNOSIS — M545 Low back pain, unspecified: Secondary | ICD-10-CM

## 2020-09-09 DIAGNOSIS — N184 Chronic kidney disease, stage 4 (severe): Secondary | ICD-10-CM

## 2020-09-09 DIAGNOSIS — Z8616 Personal history of COVID-19: Secondary | ICD-10-CM

## 2020-09-09 MED ORDER — CYCLOBENZAPRINE HCL 5 MG PO TABS: 5.0000 mg | ORAL_TABLET | Freq: Every evening | ORAL | 0 refills | Status: DC | PRN

## 2020-09-09 NOTE — Patient Instructions (Signed)
Acute Back Pain, Adult Acute back pain is sudden and usually short-lived. It is often caused by an injury to the muscles and tissues in the back. The injury may result from:  A muscle or ligament getting overstretched or torn (strained). Ligaments are tissues that connect bones to each other. Lifting something improperly can cause a back strain.  Wear and tear (degeneration) of the spinal disks. Spinal disks are circular tissue that provide cushioning between the bones of the spine (vertebrae).  Twisting motions, such as while playing sports or doing yard work.  A hit to the back.  Arthritis. You may have a physical exam, lab tests, and imaging tests to find the cause of your pain. Acute back pain usually goes away with rest and home care. Follow these instructions at home: Managing pain, stiffness, and swelling  Treatment may include medicines for pain and inflammation that are taken by mouth or applied to the skin, prescription pain medicine, or muscle relaxants. Take over-the-counter and prescription medicines only as told by your health care provider.  Your health care provider may recommend applying ice during the first 24-48 hours after your pain starts. To do this: ? Put ice in a plastic bag. ? Place a towel between your skin and the bag. ? Leave the ice on for 20 minutes, 2-3 times a day.  If directed, apply heat to the affected area as often as told by your health care provider. Use the heat source that your health care provider recommends, such as a moist heat pack or a heating pad. ? Place a towel between your skin and the heat source. ? Leave the heat on for 20-30 minutes. ? Remove the heat if your skin turns bright red. This is especially important if you are unable to feel pain, heat, or cold. You have a greater risk of getting burned. Activity  Do not stay in bed. Staying in bed for more than 1-2 days can delay your recovery.  Sit up and stand up straight. Avoid leaning  forward when you sit or hunching over when you stand. ? If you work at a desk, sit close to it so you do not need to lean over. Keep your chin tucked in. Keep your neck drawn back, and keep your elbows bent at a 90-degree angle (right angle). ? Sit high and close to the steering wheel when you drive. Add lower back (lumbar) support to your car seat, if needed.  Take short walks on even surfaces as soon as you are able. Try to increase the length of time you walk each day.  Do not sit, drive, or stand in one place for more than 30 minutes at a time. Sitting or standing for long periods of time can put stress on your back.  Do not drive or use heavy machinery while taking prescription pain medicine.  Use proper lifting techniques. When you bend and lift, use positions that put less stress on your back: ? Industry your knees. ? Keep the load close to your body. ? Avoid twisting.  Exercise regularly as told by your health care provider. Exercising helps your back heal faster and helps prevent back injuries by keeping muscles strong and flexible.  Work with a physical therapist to make a safe exercise program, as recommended by your health care provider. Do any exercises as told by your physical therapist.   Lifestyle  Maintain a healthy weight. Extra weight puts stress on your back and makes it difficult to have  good posture.  Avoid activities or situations that make you feel anxious or stressed. Stress and anxiety increase muscle tension and can make back pain worse. Learn ways to manage anxiety and stress, such as through exercise. General instructions  Sleep on a firm mattress in a comfortable position. Try lying on your side with your knees slightly bent. If you lie on your back, put a pillow under your knees.  Follow your treatment plan as told by your health care provider. This may include: ? Cognitive or behavioral therapy. ? Acupuncture or massage therapy. ? Meditation or yoga. Contact  a health care provider if:  You have pain that is not relieved with rest or medicine.  You have increasing pain going down into your legs or buttocks.  Your pain does not improve after 2 weeks.  You have pain at night.  You lose weight without trying.  You have a fever or chills. Get help right away if:  You develop new bowel or bladder control problems.  You have unusual weakness or numbness in your arms or legs.  You develop nausea or vomiting.  You develop abdominal pain.  You feel faint. Summary  Acute back pain is sudden and usually short-lived.  Use proper lifting techniques. When you bend and lift, use positions that put less stress on your back.  Take over-the-counter and prescription medicines and apply heat or ice as directed by your health care provider. This information is not intended to replace advice given to you by your health care provider. Make sure you discuss any questions you have with your health care provider. Document Revised: 05/10/2020 Document Reviewed: 05/10/2020 Elsevier Patient Education  2021 [place].  Heart Attack The heart is a muscle that needs oxygen to survive. A heart attack is a condition that occurs when your heart does not get enough oxygen. When this happens, the heart muscle begins to die. This can cause permanent damage if not treated right away. A heart attack is a medical emergency. This condition may be called a myocardial infarction, or MI. It is also known as acute coronary syndrome (ACS). ACS is a term used to describe a group of conditions that affect blood flow to the heart. What are the causes? This condition may be caused by:  Atherosclerosis. This occurs when a fatty substance called plaque builds up in the arteries and blocks or reduces blood supply to the heart.  A blood clot. A blood clot can develop suddenly when plaque breaks up within an artery and blocks blood flow to the heart.  Low blood pressure.  An  abnormal heartbeat (arrhythmia).  Conditions that cause a decrease of oxygen to the heart, such as anemiaorrespiratory failure.  A spasm, or severe tightening, of a blood vessel that cuts off blood flow to the heart.  Tearing of a coronary artery (spontaneous coronary artery dissection).  High blood pressure.   What increases the risk? The following factors may make you more likely to develop this condition:  Aging. The older you are, the higher your risk.  Having a personal or family history of chest pain, heart attack, stroke, or narrowing of the arteries in the legs, arms, head, or stomach (peripheral artery disease).  Being female.  Smoking.  Not getting regular exercise.  Being overweight or obese.  Having high blood pressure.  Having high cholesterol (hypercholesterolemia).  Having diabetes.  Drinking too much alcohol.  Using illegal drugs, such as cocaine or methamphetamine. What are the signs or symptoms?  Symptoms of this condition may vary, depending on factors like gender and age. Symptoms may include:  Chest pain. It may feel like: ? Crushing or squeezing. ? Tightness, pressure, fullness, or heaviness.  Pain in the arm, neck, jaw, back, or upper body.  Shortness of breath.  Heartburn or upset stomach.  Nausea.  Sudden cold sweats.  Feeling tired.  Sudden light-headedness. How is this diagnosed? This condition may be diagnosed through tests, such as:  Electrocardiogram (ECG) to measure the electrical activity of your heart.  Blood tests to check for cardiac markers. These chemicals are released by a damaged heart muscle.  A test to evaluate blood flow and heart function (coronary angiogram).  CT scan to see the heart more clearly.  A test to evaluate the pumping action of the heart (echocardiogram). How is this treated? A heart attack must be treated as soon as possible. Treatment may include:  Medicines to: ? Break up or dissolve blood  clots (fibrinolytic therapy). ? Thin blood and help prevent blood clots. ? Treat blood pressure. ? Improve blood flow to the heart. ? Reduce pain. ? Reduce cholesterol.  Angioplasty and stent placement. These are procedures to widen a blocked artery and keep it open.  Coronary artery bypass graft, CABG, or open heart surgery. This enables blood to flow to the heart by going around the blocked part of the artery.  Oxygen therapy if needed.  Cardiac rehabilitation. This improves your health and well-being through exercise, education, and counseling.   Follow these instructions at home: Medicines  Take over-the-counter and prescription medicines only as told by your health care provider.  Do not take the following medicines unless your health care provider says it is okay to take them: ? NSAIDs, such as ibuprofen. ? Supplements that contain vitamin A, vitamin E, or both. ? Hormone replacement therapy that contains estrogen with or without progestin. Lifestyle  Do not use any products that contain nicotine or tobacco, such as cigarettes, e-cigarettes, and chewing tobacco. If you need help quitting, ask your health care provider.  Avoid secondhand smoke.  Exercise regularly. Ask your health care provider about participating in a cardiac rehabilitation program that helps you start exercising safely after a heart attack.  Eat a heart-healthy diet. Your health care provider will tell you what foods to eat.  Maintain a healthy weight.  Learn ways to manage stress.  Do not use illegal drugs.   Alcohol use  Do not drink alcohol if: ? Your health care provider tells you not to drink. ? You are pregnant, may be pregnant, or are planning to become pregnant.  If you drink alcohol: ? Limit how much you use to:  0-1 drink a day for women.  0-2 drinks a day for men. ? Be aware of how much alcohol is in your drink. In the [place] one drink equals one 12 oz bottle of beer (355 mL), one 5  oz glass of wine (148 mL), or one 1 oz glass of hard liquor (44 mL). General instructions  Work with your health care provider to manage any other conditions you have, such as high blood pressure or diabetes. These conditions affect your heart.  Get screened for depression, and seek treatment if needed.  Keep your vaccinations up to date. Get the flu vaccine every year.  Keep all follow-up visits as told by your health care provider. This is important. Contact a health care provider if:  You feel overwhelmed or sad.  You have trouble  doing your daily activities. Get help right away if:  You have sudden, unexplained discomfort in your chest, arms, back, neck, jaw, or upper body.  You have shortness of breath.  You suddenly start to sweat or your skin gets clammy.  You feel nauseous or you vomit.  You have unexplained tiredness or weakness.  You suddenly feel light-headed or dizzy.  You notice your heart starts to beat fast or feels like it is skipping beats.  You have blood pressure that is higher than 180/120. These symptoms may represent a serious problem that is an emergency. Do not wait to see if the symptoms will go away. Get medical help right away. Call your local emergency services (911 in the [place] Do not drive yourself to the hospital. Summary  A heart attack, also called myocardial infarction, is a condition that occurs when your heart does not get enough oxygen. This is caused by anything that blocks or reduces blood flow to the heart.  Treatment is a combination of medicines and surgeries, if needed, to open the blocked arteries and restore blood flow to the heart.  A heart attack is an emergency. Get help right away if you have sudden discomfort in your chest, arms, back, neck, jaw, or upper body. Seek help if you feel nauseous, you vomit, or you feel light-headed or dizzy. This information is not intended to replace advice given to you by your health care  provider. Make sure you discuss any questions you have with your health care provider. Document Revised: 11/24/2018 Document Reviewed: 11/28/2018 Elsevier Patient Education  2021 [employer] Inc[employer]  Textbook of family medicine (9th ed., pp. [id number]). [place], [place]: [employer].">  Stress, Adult Stress is a normal reaction to life events. Stress is what you feel when life demands more than you are used to, or more than you think you can handle. Some stress can be useful, such as studying for a test or meeting a deadline at work. Stress that occurs too often or for too long can cause problems. It can affect your emotional health and interfere with relationships and normal daily activities. Too much stress can weaken your body's defense system (immune system) and increase your risk for physical illness. If you already have a medical problem, stress can make it worse. What are the causes? All sorts of life events can cause stress. An event that causes stress for one person may not be stressful for another person. Major life events, whether positive or negative, commonly cause stress. Examples include:  Losing a job or starting a new job.  Losing a loved one.  Moving to a new town or home.  Getting married or divorced.  Having a baby.  Getting injured or sick. Less obvious life events can also cause stress, especially if they occur day after day or in combination with each other. Examples include:  Working long hours.  Driving in traffic.  Caring for children.  Being in debt.  Being in a difficult relationship. What are the signs or symptoms? Stress can cause emotional symptoms, including:  Anxiety. This is feeling worried, afraid, on edge, overwhelmed, or out of control.  Anger, including irritation or impatience.  Depression. This is feeling sad, down, helpless, or guilty.  Trouble focusing, remembering, or making decisions. Stress can cause physical symptoms, including:  Aches  and pains. These may affect your head, neck, back, stomach, or other areas of your body.  Tight muscles or a clenched jaw.  Low energy.  Trouble sleeping. Stress  can cause unhealthy behaviors, including:  Eating to feel better (overeating) or skipping meals.  Working too much or putting off tasks.  Smoking, drinking alcohol, or using drugs to feel better. How is this diagnosed? Stress is diagnosed through an assessment by your health care provider. He or she may diagnose this condition based on:  Your symptoms and any stressful life events.  Your medical history.  Tests to rule out other causes of your symptoms. Depending on your condition, your health care provider may refer you to a specialist for further evaluation. How is this treated? Stress management techniques are the recommended treatment for stress. Medicine is not typically recommended for the treatment of stress. Techniques to reduce your reaction to stressful life events include:  Stress identification. Monitor yourself for symptoms of stress and identify what causes stress for you. These skills may help you to avoid or prepare for stressful events.  Time management. Set your priorities, keep a calendar of events, and learn to say no. Taking these actions can help you avoid making too many commitments. Techniques for coping with stress include:  Rethinking the problem. Try to think realistically about stressful events rather than ignoring them or overreacting. Try to find the positives in a stressful situation rather than focusing on the negatives.  Exercise. Physical exercise can release both physical and emotional tension. The key is to find a form of exercise that you enjoy and do it regularly.  Relaxation techniques. These relax the body and mind. The key is to find one or more that you enjoy and use the techniques regularly. Examples include: ? Meditation, deep breathing, or progressive relaxation  techniques. ? Yoga or tai chi. ? Biofeedback, mindfulness techniques, or journaling. ? Listening to music, being out in nature, or participating in other hobbies.  Practicing a healthy lifestyle. Eat a balanced diet, drink plenty of water, limit or avoid caffeine, and get plenty of sleep.  Having a strong support network. Spend time with family, friends, or other people you enjoy being around. Express your feelings and talk things over with someone you trust. Counseling or talk therapy with a mental health professional may be helpful if you are having trouble managing stress on your own.   Follow these instructions at home: Lifestyle  Avoid drugs.  Do not use any products that contain nicotine or tobacco, such as cigarettes, e-cigarettes, and chewing tobacco. If you need help quitting, ask your health care provider.  Limit alcohol intake to no more than 1 drink a day for nonpregnant women and 2 drinks a day for men. One drink equals 12 oz of beer, 5 oz of wine, or 1 oz of hard liquor  Do not use alcohol or drugs to relax.  Eat a balanced diet that includes fresh fruits and vegetables, whole grains, lean meats, fish, eggs, and beans, and low-fat dairy. Avoid processed foods and foods high in added fat, sugar, and salt.  Exercise at least 30 minutes on 5 or more days each week.  Get 7-8 hours of sleep each night.   General instructions  Practice stress management techniques as discussed with your health care provider.  Drink enough fluid to keep your urine clear or pale yellow.  Take over-the-counter and prescription medicines only as told by your health care provider.  Keep all follow-up visits as told by your health care provider. This is important.   Contact a health care provider if:  Your symptoms get worse.  You have new symptoms.  You  feel overwhelmed by your problems and can no longer manage them on your own. Get help right away if:  You have thoughts of hurting  yourself or others. If you ever feel like you may hurt yourself or others, or have thoughts about taking your own life, get help right away. You can go to your nearest emergency department or call:  Your local emergency services (911 in the [place]  A suicide crisis helpline, such as the [place] at 814-705-3779. This is open 24 hours a day. Summary  Stress is a normal reaction to life events. It can cause problems if it happens too often or for too long.  Practicing stress management techniques is the best way to treat stress.  Counseling or talk therapy with a mental health professional may be helpful if you are having trouble managing stress on your own. This information is not intended to replace advice given to you by your health care provider. Make sure you discuss any questions you have with your health care provider. Document Revised: 05/03/2020 Document Reviewed: 05/03/2020 Elsevier Patient Education  2021 [employer].

## 2020-09-09 NOTE — Progress Notes (Signed)
Subjective:    Patient ID: Casey Acosta, female    DOB: 1971-08-21, 50 y.o.   MRN: [id number]  No chief complaint on file.   HPI Patient is a 50 yo female with pmh sig for DM II, HTN, CKD 3, obesity, HLD,recent COVID infection, STEMI with DES who was seen today for f/u.  Pt admitted to the hospital 12/5-12/7/21 for acute STEMI.  PCI of mid LCx with overlapping DES x2.  DAPT x 1 yr advised.    Pt tested positive for COVID on 12/30.  Endorses sx of sinus pressure, HA, congestion.  Pt also notes low back pain x 2 wks with radiation into b/l legs.  Tried tramadol.    Past Medical History:  Diagnosis Date  . Anemia   . Anxiety   . Anxiety and depression   . Chronic kidney disease   . Colon polyp   . Diabetes mellitus without complication ([place])   . Dysuria   . High risk sexual behavior   . History of syncope   . Hyperlipidemia   . Hypertension   . Internal hemorrhoids   . LVH (left ventricular hypertrophy)   . Malaise and fatigue   . Obesity (BMI 30-39.9)   . Ovarian mass   . Panic disorder   . PCOS (polycystic ovarian syndrome)   . Rectal bleeding   . Shortness of breath   . Surgical menopause   . Vaginal trichomoniasis     Allergies  Allergen Reactions  . Dilaudid [Hydromorphone] Nausea And Vomiting  . Prozac [Fluoxetine Hcl]     hallucinations    ROS General: Denies fever, chills, night sweats, changes in weight, changes in appetite HEENT: Denies headaches, ear pain, changes in vision, rhinorrhea, sore throat CV: Denies CP, palpitations, SOB, orthopnea Pulm: Denies SOB, cough, wheezing GI: Denies abdominal pain, nausea, vomiting, diarrhea, constipation GU: Denies dysuria, hematuria, frequency, vaginal discharge Msk: Denies muscle cramps, joint pains  +back pain with radiation into b/l LEs Neuro: Denies weakness, numbness, tingling Skin: Denies rashes, bruising Psych: Denies depression, anxiety, hallucinations    Objective:    Blood pressure 140/82, pulse 84,  temperature 98.2 F (36.8 C), temperature source Oral, weight 203 lb 9.6 oz (92.4 kg), SpO2 98 %.  Gen. Pleasant, well-nourished, in no distress, normal affect  HEENT: [place]/AT, face symmetric, conjunctiva clear, no scleral icterus, PERRLA, EOMI, nares patent without drainage. Neck: No JVD, no thyromegaly, no carotid bruits Lungs: no accessory muscle use, CTAB, no wheezes or rales Cardiovascular: RRR, no m/r/g, no peripheral edema Musculoskeletal: No deformities, no cyanosis or clubbing, normal tone Neuro:  A&Ox3, CN II-XII intact, normal gait Skin:  Warm, no lesions/ rash  Wt Readings from Last 3 Encounters:  08/14/20 203 lb 12.8 oz (92.4 kg)  08/06/20 200 lb 4.8 oz (90.9 kg)  07/12/20 207 lb (93.9 kg)    Lab Results  Component Value Date   WBC 12.0 (H) 08/06/2020   HGB 9.3 (L) 08/06/2020   HCT 28.1 (L) 08/06/2020   PLT 308 08/06/2020   GLUCOSE 144 (H) 08/08/2020   CHOL 347 (H) 08/04/2020   TRIG 425 (H) 08/04/2020   HDL 39 (L) 08/04/2020   LDLDIRECT 206.7 (H) 08/04/2020   LDLCALC UNABLE TO CALCULATE IF TRIGLYCERIDE OVER 400 mg/dL 08/04/2020   ALT 17 08/04/2020   AST 22 08/04/2020   NA 136 08/08/2020   K 4.8 08/08/2020   CL 103 08/08/2020   CREATININE 2.44 (H) 08/08/2020   BUN 23 08/08/2020   CO2 17 (L)  08/08/2020   TSH 1.089 05/27/2020   TSH 1.148 05/27/2020   INR 1.1 08/04/2020   HGBA1C 9.9 (H) 08/04/2020   MICROALBUR 95.1 (H) 02/23/2018    Assessment/Plan:  Acute midline low back pain with b/l sciatica -discussed supportive care including heat, massage, rest, Tylenol -given handout -continue to monitor  - Plan: cyclobenzaprine (FLEXERIL) 5 MG tablet  STEMI involving left circumflex coronary artery (HCC) -s/p DES stent x 2 to LCx -continue DAPT (ASA and Brilinta) x 1 yr -continue medical management with coreg 25 mg BID, crestor 40 mg, Brilinta 90 mg, hydralazine QID -continue f/u with Cardiology.    Essential hypertension -elevated  -continue coreg 25 mg  BID, norvasc 10 mg, hydralazine 100 mg QID -lifestyle modifications encouraged -pt encouraged to check bp at home and keep a log to bring to clinic visits -continue f/u with Cardiology  Type 2 diabetes with CKD stage 4, without long-term use of insulin -creatinine 2.44 and GFR 26 on 08/08/20 -hgb A1C 9.9% on 08/04/20 -continue holding metformin and jardiance -continue glimepiride 2 mg  -avoid nephrotoxic meds and renally dose meds -check fsbs and keep a log to bring with you to clnic  -Plan: referral to Nephrology  Personal h/o COVID-19 -recovered  F/u in 1 month, sooner if needed  Grier Mitts, MD

## 2020-09-25 ENCOUNTER — Encounter: Payer: Self-pay | Admitting: Family Medicine

## 2020-10-05 ENCOUNTER — Other Ambulatory Visit: Payer: Self-pay | Admitting: Family Medicine

## 2020-10-05 DIAGNOSIS — F419 Anxiety disorder, unspecified: Secondary | ICD-10-CM

## 2020-10-05 DIAGNOSIS — F32A Depression, unspecified: Secondary | ICD-10-CM

## 2020-10-14 ENCOUNTER — Ambulatory Visit: Payer: 59 | Admitting: Internal Medicine

## 2020-10-15 ENCOUNTER — Encounter: Payer: Self-pay | Admitting: Internal Medicine

## 2020-10-15 ENCOUNTER — Ambulatory Visit: Payer: 59 | Admitting: Internal Medicine

## 2020-10-16 ENCOUNTER — Encounter: Payer: Self-pay | Admitting: Family Medicine

## 2020-10-21 ENCOUNTER — Other Ambulatory Visit: Payer: Self-pay

## 2020-10-21 MED ORDER — CARVEDILOL 25 MG PO TABS
25.0000 mg | ORAL_TABLET | Freq: Two times a day (BID) | ORAL | 2 refills | Status: DC
Start: 2020-10-21 — End: 2021-01-13

## 2020-11-05 ENCOUNTER — Other Ambulatory Visit: Payer: Self-pay | Admitting: Family Medicine

## 2020-11-05 DIAGNOSIS — M5441 Lumbago with sciatica, right side: Secondary | ICD-10-CM

## 2020-11-05 DIAGNOSIS — M5442 Lumbago with sciatica, left side: Secondary | ICD-10-CM

## 2020-11-06 NOTE — Telephone Encounter (Signed)
Last filled 09-23-20 #30 Last OV 09-09-20 No Future OV Casey Acosta Casey Acosta

## 2020-11-08 ENCOUNTER — Telehealth: Payer: Self-pay | Admitting: Internal Medicine

## 2020-11-08 NOTE — Telephone Encounter (Signed)
Patient dismissed from [facility] by Philemon Kingdom, MD, effective 10/15/20. Dismissal Letter sent out by 1st class mail. [facility]

## 2020-11-20 ENCOUNTER — Encounter: Payer: Self-pay | Admitting: Family Medicine

## 2020-11-21 ENCOUNTER — Ambulatory Visit: Payer: BC Managed Care – PPO | Admitting: Internal Medicine

## 2020-11-28 NOTE — Telephone Encounter (Signed)
Patient is calling back and wanted to follow up with the request for referral. Pt was very unhappy about not getting a response and has scheduled to come in on 4/4 to speak to provider.

## 2020-11-29 ENCOUNTER — Encounter: Payer: 59 | Admitting: Obstetrics and Gynecology

## 2020-12-02 ENCOUNTER — Encounter: Payer: Self-pay | Admitting: Family Medicine

## 2020-12-02 ENCOUNTER — Other Ambulatory Visit: Payer: Self-pay

## 2020-12-02 ENCOUNTER — Ambulatory Visit (INDEPENDENT_AMBULATORY_CARE_PROVIDER_SITE_OTHER): Payer: BC Managed Care – PPO | Admitting: Family Medicine

## 2020-12-02 VITALS — BP 162/98 | HR 80 | Temp 98.4°F | Wt 209.0 lb

## 2020-12-02 DIAGNOSIS — F32A Depression, unspecified: Secondary | ICD-10-CM

## 2020-12-02 DIAGNOSIS — I252 Old myocardial infarction: Secondary | ICD-10-CM | POA: Diagnosis not present

## 2020-12-02 DIAGNOSIS — E1122 Type 2 diabetes mellitus with diabetic chronic kidney disease: Secondary | ICD-10-CM | POA: Diagnosis not present

## 2020-12-02 DIAGNOSIS — N184 Chronic kidney disease, stage 4 (severe): Secondary | ICD-10-CM

## 2020-12-02 DIAGNOSIS — I10 Essential (primary) hypertension: Secondary | ICD-10-CM | POA: Diagnosis not present

## 2020-12-02 DIAGNOSIS — F419 Anxiety disorder, unspecified: Secondary | ICD-10-CM | POA: Diagnosis not present

## 2020-12-02 LAB — POCT GLYCOSYLATED HEMOGLOBIN (HGB A1C): Hemoglobin A1C: 8.5 % — AB (ref 4.0–5.6)

## 2020-12-02 MED ORDER — OZEMPIC (0.25 OR 0.5 MG/DOSE) 2 MG/1.5ML ~~LOC~~ SOPN: 0.5000 mg | PEN_INJECTOR | SUBCUTANEOUS | 0 refills | Status: DC

## 2020-12-02 MED ORDER — SERTRALINE HCL 50 MG PO TABS: 50.0000 mg | ORAL_TABLET | Freq: Every day | ORAL | 3 refills | Status: DC

## 2020-12-02 MED ORDER — HYDROXYZINE HCL 50 MG PO TABS: 50.0000 mg | ORAL_TABLET | Freq: Four times a day (QID) | ORAL | 2 refills | Status: DC | PRN

## 2020-12-02 MED ORDER — GLUCOSE BLOOD VI STRP: ORAL_STRIP | 12 refills | Status: AC

## 2020-12-02 NOTE — Progress Notes (Signed)
Subjective:    Patient ID: Jaala Bohle, female    DOB: [birth year], 50 y.o.   MRN: [id number]  No chief complaint on file.   HPI Patient is a 50 year old female with PMH sig for anxiety, depression, CKD 4, HLD, HTN, LVH, PCOS, MI who was seen today for follow-up.  Patient notes elevated FSBS with the highest 200.  Taking Amaryl 1 mg.  Unable to take Metformin 2/2 CKD 4.  Pt inquires about restarting Ozempic.  Needs refills on test strips.  Previously seen by Endo, but dismissed 2/2 missing visits. Pt notes increased stress in personal life (new marriage) causing BP elevation this visit.  BP at home typically 130s/80s.  Patient interested in increasing Zoloft.  Taking hydroxyzine prn.  Started counseling.  Past Medical History:  Diagnosis Date  . Anemia   . Anxiety   . Anxiety and depression   . Chronic kidney disease   . Colon polyp   . Diabetes mellitus without complication ([place])   . Dysuria   . High risk sexual behavior   . History of syncope   . Hyperlipidemia   . Hypertension   . Internal hemorrhoids   . LVH (left ventricular hypertrophy)   . Malaise and fatigue   . Obesity (BMI 30-39.9)   . Ovarian mass   . Panic disorder   . PCOS (polycystic ovarian syndrome)   . Rectal bleeding   . Shortness of breath   . Surgical menopause   . Vaginal trichomoniasis     Allergies  Allergen Reactions  . Dilaudid [Hydromorphone] Nausea And Vomiting  . Prozac [Fluoxetine Hcl]     hallucinations    ROS General: Denies fever, chills, night sweats, changes in weight, changes in appetite HEENT: Denies headaches, ear pain, changes in vision, rhinorrhea, sore throat CV: Denies CP, palpitations, SOB, orthopnea Pulm: Denies SOB, cough, wheezing GI: Denies abdominal pain, nausea, vomiting, diarrhea, constipation GU: Denies dysuria, hematuria, frequency, vaginal discharge Msk: Denies muscle cramps, joint pains Neuro: Denies weakness, numbness, tingling Skin: Denies rashes,  bruising Psych: Denies hallucinations  +anxiety, depression    Objective:    Blood pressure (!) 162/98, pulse 80, temperature 98.4 F (36.9 C), temperature source Oral, weight 209 lb (94.8 kg), SpO2 97 %.   Gen. Pleasant, well-nourished, in no distress, normal affect   HEENT: [place]/AT, face symmetric, conjunctiva clear, no scleral icterus, PERRLA, EOMI, nares patent without drainage Lungs: no accessory muscle use, CTAB, no wheezes or rales Cardiovascular: RRR, no m/r/g, no peripheral edema Musculoskeletal: No deformities, no cyanosis or clubbing, normal tone Neuro:  A&Ox3, CN II-XII intact, normal gait Skin:  Warm, no lesions/ rash   Wt Readings from Last 3 Encounters:  12/02/20 209 lb (94.8 kg)  09/09/20 203 lb 9.6 oz (92.4 kg)  08/14/20 203 lb 12.8 oz (92.4 kg)    Lab Results  Component Value Date   WBC 12.0 (H) 08/06/2020   HGB 9.3 (L) 08/06/2020   HCT 28.1 (L) 08/06/2020   PLT 308 08/06/2020   GLUCOSE 144 (H) 08/08/2020   CHOL 347 (H) 08/04/2020   TRIG 425 (H) 08/04/2020   HDL 39 (L) 08/04/2020   LDLDIRECT 206.7 (H) 08/04/2020   LDLCALC UNABLE TO CALCULATE IF TRIGLYCERIDE OVER 400 mg/dL 08/04/2020   ALT 17 08/04/2020   AST 22 08/04/2020   NA 136 08/08/2020   K 4.8 08/08/2020   CL 103 08/08/2020   CREATININE 2.44 (H) 08/08/2020   BUN 23 08/08/2020   CO2 17 (L) 08/08/2020  TSH 1.089 05/27/2020   TSH 1.148 05/27/2020   INR 1.1 08/04/2020   HGBA1C 8.5 (A) 12/02/2020   MICROALBUR 95.1 (H) 02/23/2018    Assessment/Plan:  Type 2 diabetes mellitus with stage 4 chronic kidney disease, without long-term current use of insulin (HCC)  -hgb A1C 8.5% this visit -continue glimepiride 2 mg daily -start ozempic 0.5 mg once a wk -referral to nephrology placed 09/27/20, awaiting appt. - Plan: POCT glycosylated hemoglobin (Hb A1C), Semaglutide,0.25 or 0.5MG /DOS, (OZEMPIC, 0.25 OR 0.5 MG/DOSE,) 2 MG/1.5ML SOPN, glucose blood test strip  Anxiety and depression  -worsened 2/2  increase stress -continue counseling -will increase zoloft form 25 mg daily to 50 mg  -hydroxyzine prn -discussed the importance of self care - Plan: sertraline (ZOLOFT) 50 MG tablet, hydrOXYzine (ATARAX/VISTARIL) 50 MG tablet  Essential hypertension -elevated -discussed the importance of decreasing stress -lifestyle modifications -Continue Coreg 25 mg twice daily, clonidine patch 0.1 mg daily, Norvasc 10 mg daily, hydralazine 100 mg QID -contniue f/u with Cardiology  History of STEMI -Dec 2021 -s/p DES x 2 to LCx -continue brillinta 90 mg, coreg 25 mg BID, crestor 40 mg, and hydralazine 100 mg QID -continue f/u with Cardiology  F/u 4-6 wks, sooner if needed.  Grier Mitts, MD

## 2020-12-06 ENCOUNTER — Encounter: Payer: Self-pay | Admitting: Cardiovascular Disease

## 2020-12-06 NOTE — Telephone Encounter (Signed)
error 

## 2020-12-08 ENCOUNTER — Encounter: Payer: Self-pay | Admitting: Family Medicine

## 2020-12-10 ENCOUNTER — Ambulatory Visit: Payer: BC Managed Care – PPO | Admitting: Physician Assistant

## 2020-12-10 ENCOUNTER — Telehealth: Payer: Self-pay | Admitting: *Deleted

## 2020-12-10 NOTE — Telephone Encounter (Signed)
Received call from Dr. Tessa Lerner with [facility].    Spoke to Dr. Claiborne Billings (DOD).

## 2020-12-11 ENCOUNTER — Encounter: Payer: Self-pay | Admitting: Nurse Practitioner

## 2020-12-11 NOTE — Progress Notes (Deleted)
Cardiology Office Note:    Date:  12/11/2020   ID:  Casey Acosta, DOB June 29, 1971, MRN [id number]  PCP:  Casey Ruddy, MD  Cardiologist:  Kirk Ruths, MD / Skeet Latch, MD ([facility]) Electrophysiologist:  None   Referring MD: Casey Ruddy, MD   Chief Complaint: ***  History of Present Illness:    Casey Acosta is a 50 y.o. female with a history of diffuse CAD with STEMI in 07/2020 s/p successful PCI with overlapping DES x2 to mid LCX, hypertension, hyperlipidemia, type 2 diabetes mellitus, and CKD stage IV who is followed by Dr. Stanford Breed (as well as Dr. Blenda Mounts hypertension clinic) and presents today for follow-up of ***  Patient was admitted in 05/2020 with chest pain and elevated troponin in the setting of uncontrolled hypertension due to medication non-compliance. Elevated troponin was felt to be due to demand ischemia. However, she did undergo Myoview during that admission which was felt to be low risk. Echo during admission showed normal wall motion with possible mass of aortic valve. TEE was performed and showed nodular thickening of the edge of the left coronary cusp of the aortic valve. Not felt to be clinically significant. Patient presented again in 07/2020 with chest pain and found to have an acute inferolateral STEMI. She had again been off her BP medications. Cardiac catheterization showed diffusely diseased diabetic coronary arteries with 100% occlusion of mid to distal LCX. Otherwise, moderate 3 vessel CAD. She underwent successful PCI with overlapping DES x2 to LCX lesion. Echo during admission showed LVEF of 55-60% with normal wall motion, severe concentric LVH, and type 2 diastolic dysfunction. Patient was seen by Casey Ransom, PA-C, for follow-up on 08/14/2020 at which time she was doing well from a cardiac standpoint and her BP was well controlled.  Patient was admitted from *** to *** at [facility] for ***.  CAD - History  of inferolateral STEMI in 07/2020 s/p overlapping DES x2 to LCX. LHC showed diffusely diseased diabetic coronary disease.  - *** - Continue DAPT with Aspirin and Brilinta. *** - Continue beta-blocker and high-intensity statin.  Hypertension *** - Current medications: Amlodipine 10mg  daily, Coreg 25mg  twice dialy, Hydralazine 100mg  four times daily. ***  Hyperlipidemia - Most recent lipid panel from 07/2020: Total Cholesterol 347, Triglycerides 425, HDL 39. Direct LDL 206. *** - Crestor increased to 40mg  daily at time of last check. - Will recheck lipid panel and LFTs. May need to start Vascepa if triglycerides still elevated. Also may need to consider referral to lipid clinic for possible PCSK9 inhibitor if LDL still above goal. ***  Type 2 Diabetes Mellitus  - Hemoglobin A1c 8.5% on 12/02/2020.  - Managed by Endocrinology.  CKD Stage IV - Creatinine 1.9 on on 11/19/2019 which is around patient's baseline. - ***  Past Medical History:  Diagnosis Date  . Anemia   . Anxiety   . Anxiety and depression   . Chronic kidney disease   . Colon polyp   . Diabetes mellitus without complication ([place])   . Dysuria   . High risk sexual behavior   . History of syncope   . Hyperlipidemia   . Hypertension   . Internal hemorrhoids   . LVH (left ventricular hypertrophy)   . Malaise and fatigue   . Obesity (BMI 30-39.9)   . Ovarian mass   . Panic disorder   . PCOS (polycystic ovarian syndrome)   . Rectal bleeding   . Shortness of breath   .  Surgical menopause   . Vaginal trichomoniasis     Past Surgical History:  Procedure Laterality Date  . ABDOMINAL HYSTERECTOMY     partial  . APPENDECTOMY    . CHOLECYSTECTOMY    . CORONARY/GRAFT ACUTE MI REVASCULARIZATION N/A 08/04/2020   Procedure: Coronary/Graft Acute MI Revascularization;  Surgeon: Martinique, Peter M, MD;  Location: [place] [facility];  Service: Cardiovascular;  Laterality: N/A;  . LEFT HEART CATH AND CORONARY ANGIOGRAPHY N/A  08/04/2020   Procedure: LEFT HEART CATH AND CORONARY ANGIOGRAPHY;  Surgeon: Martinique, Peter M, MD;  Location: [place] [facility];  Service: Cardiovascular;  Laterality: N/A;  . TEE WITHOUT CARDIOVERSION N/A 05/27/2020   Procedure: TRANSESOPHAGEAL ECHOCARDIOGRAM (TEE);  Surgeon: Josue Hector, MD;  Location: [facility] ENDOSCOPY;  Service: Cardiovascular;  Laterality: N/A;  . TUBAL LIGATION      Current Medications: No outpatient medications have been marked as taking for the 12/17/20 encounter (Appointment) with Casey Mclean, PA-C.     Allergies:   Dilaudid [hydromorphone] and Prozac [fluoxetine hcl]   Social History   Socioeconomic History  . Marital status: Significant Other    Spouse name: Not on file  . Number of children: Not on file  . Years of education: Not on file  . Highest education level: Not on file  Occupational History  . Not on file  Tobacco Use  . Smoking status: Never Smoker  . Smokeless tobacco: Never Used  Vaping Use  . Vaping Use: Never used  Substance and Sexual Activity  . Alcohol use: Yes    Comment: Occas. wine  . Drug use: No  . Sexual activity: Yes    Birth control/protection: Surgical    Comment: HYST.-1st intercourse 50 yo-More than 5 partners  Other Topics Concern  . Not on file  Social History Narrative  . Not on file   Social Determinants of Health   Financial Resource Strain: Not on file  Food Insecurity: Not on file  Transportation Needs: Not on file  Physical Activity: Not on file  Stress: Not on file  Social Connections: Not on file     Family History: The patient's ***family history includes ALS in her father; Anxiety disorder in her mother; Crohn's disease in her brother and sister; Depression in her mother; Diabetes in her mother and sister; Hyperlipidemia in her mother; Hypertension in her mother and sister; Thyroid disease in her mother.  ROS:   Please see the history of present illness.    *** All other systems reviewed and  are negative.  EKGs/Labs/Other Studies Reviewed:    The following studies were reviewed today:  Left Cardiac Catheterization 08/04/2020:  Prox LAD lesion is 60% stenosed.  Mid LAD lesion is 50% stenosed.  1st Diag lesion is 80% stenosed.  [place]amus lesion is 50% stenosed.  Mid Cx to Dist Cx lesion is 100% stenosed.  Prox RCA to Mid RCA lesion is 80% stenosed.  Dist RCA lesion is 50% stenosed.  RPDA lesion is 40% stenosed.  LPAV lesion is 80% stenosed.  3rd LPL lesion is 90% stenosed.  1st LPL lesion is 50% stenosed.  A drug-eluting stent was successfully placed using a STENT RESOLUTE ONYX 3.5X18.  Post intervention, there is a 0% residual stenosis.  LV end diastolic pressure is normal.   1. Diffusely diseased diabetic coronary arteries with acute occlusion of the mid to distal LCx. This is a codominant vessel. Otherwise moderate 3 vessel disease. 2. Normal LVEDP 3. Successful PCI of the mid LCx  with overlapping DES x 2  Plan: DAPT for at least one year. Aggressive risk factor modification and antianginal therapy. At this point would treat her residual disease medically. If she should have refractory angina in the future would need to be considered for CABG.   Diagnostic Dominance: Co-dominant    Intervention    _______________  Echocardiogram 08/05/2020: Impressions: 1. There is a small mass (0.6 cm x 0.5 cm) attached to the [facility] of the  aortic valve. The lesion is not freely mobile and moves with the leaflet.  Recent TEE reported this to be nodular thickening and not a papillary  fibroelastoma. The aortic valve is  tricuspid. Aortic valve regurgitation is not visualized. No aortic  stenosis is present.  2. Left ventricular ejection fraction, by estimation, is 55 to 60%. The  left ventricle has normal function. The left ventricle has no regional  wall motion abnormalities. There is severe concentric left ventricular  hypertrophy. Left ventricular diastolic   parameters are consistent with Grade II diastolic dysfunction  (pseudonormalization). Elevated left atrial pressure. The E/e' is 19.0.  3. Right ventricular systolic function is normal. The right ventricular  size is normal. Tricuspid regurgitation signal is inadequate for assessing  PA pressure.  4. A small pericardial effusion is present. The pericardial effusion is  circumferential. There is no evidence of cardiac tamponade.  5. The mitral valve is grossly normal. Mild mitral valve regurgitation.  No evidence of mitral stenosis.  6. The inferior vena cava is normal in size with greater than 50%  respiratory variability, suggesting right atrial pressure of 3 mmHg.   Comparison(s): No significant change from prior study.   EKG:  EKG *** ordered today. EKG personally reviewed and demonstrates ***.  Recent Labs: 05/24/2020: B Natriuretic Peptide 166.1 05/27/2020: TSH 1.089; TSH 1.148 08/04/2020: ALT 17; Magnesium 1.8 08/06/2020: Hemoglobin 9.3; Platelets 308 08/08/2020: BUN 23; Creatinine, Ser 2.44; Potassium 4.8; Sodium 136  Recent Lipid Panel    Component Value Date/Time   CHOL 347 (H) 08/04/2020 0109   TRIG 425 (H) 08/04/2020 0109   HDL 39 (L) 08/04/2020 0109   CHOLHDL 8.9 08/04/2020 0109   VLDL UNABLE TO CALCULATE IF TRIGLYCERIDE OVER 400 mg/dL 08/04/2020 0109   LDLCALC UNABLE TO CALCULATE IF TRIGLYCERIDE OVER 400 mg/dL 08/04/2020 0109   LDLDIRECT 206.7 (H) 08/04/2020 0109    Physical Exam:    Vital Signs: There were no vitals taken for this visit.    Wt Readings from Last 3 Encounters:  12/02/20 209 lb (94.8 kg)  09/09/20 203 lb 9.6 oz (92.4 kg)  08/14/20 203 lb 12.8 oz (92.4 kg)     General: 50 y.o. female in no acute distress. HEENT: Normocephalic and atraumatic. Sclera clear. EOMs intact. Neck: Supple. No carotid bruits. No JVD. Heart: *** RRR. Distinct S1 and S2. No murmurs, gallops, or rubs. Radial and distal pedal pulses 2+ and equal bilaterally. Lungs: No  increased work of breathing. Clear to ausculation bilaterally. No wheezes, rhonchi, or rales.  Abdomen: Soft, non-distended, and non-tender to palpation. Bowel sounds present in all 4 quadrants.  MSK: Normal strength and tone for age. *** Extremities: No lower extremity edema.    Skin: Warm and dry. Neuro: Alert and oriented x3. No focal deficits. Psych: Normal affect. Responds appropriately.   Assessment:    No diagnosis found.  Plan:     Disposition: Follow up in ***   Medication Adjustments/Labs and Tests Ordered: Current medicines are reviewed at length with the patient today.  Concerns regarding medicines are outlined above.  No orders of the defined types were placed in this encounter.  No orders of the defined types were placed in this encounter.   There are no Patient Instructions on file for this visit.   Signed, Casey Mclean, PA-C  12/11/2020 4:51 PM    [facility]

## 2020-12-12 ENCOUNTER — Telehealth: Payer: Self-pay

## 2020-12-12 ENCOUNTER — Ambulatory Visit: Payer: 59 | Admitting: Nurse Practitioner

## 2020-12-12 NOTE — Telephone Encounter (Signed)
Will fax request for records [phone]

## 2020-12-12 NOTE — Telephone Encounter (Signed)
-----   Message from Darreld Mclean, [place] sent at 12/11/2020 10:18 PM EDT ----- Regarding: Outside Hospital Records [place],  I was pre-charting some tonight for next week. I am scheduled to see her next Tuesday 12/17/2020. It looks like this patient may have been hospitalized at the [facility] last month. Can we see if we can get records from this?  Thank you so much! Callie

## 2020-12-17 ENCOUNTER — Ambulatory Visit: Payer: BC Managed Care – PPO | Admitting: Student

## 2020-12-17 ENCOUNTER — Telehealth: Payer: Self-pay | Admitting: Podiatry

## 2020-12-17 NOTE — Telephone Encounter (Signed)
Patient called in to schedule annual diabetic foot care appointment for her and her husband. In the midst of scheduling I noticed the patient has cancelled or no showed more than 3 appointments so I informed patient if she was to cancel or no show this appointment that we wouldn't be able to reschedule appointment per [facility]. As I was explaining to patient she became very disrespectful and began talking over me as I was trying to explain. Stating I already knew that's what you all were doing but I am at work and can't be standing on the phone. So I stopped talking and allowed patient to carry on once she stopped talking I proceeded with scheduling. Not sure if patient is completely aware of the times and dates but everything was clarified before hanging up.

## 2020-12-25 ENCOUNTER — Ambulatory Visit: Payer: BC Managed Care – PPO | Admitting: Nurse Practitioner

## 2020-12-25 DIAGNOSIS — Z0289 Encounter for other administrative examinations: Secondary | ICD-10-CM

## 2021-01-02 ENCOUNTER — Other Ambulatory Visit: Payer: Self-pay

## 2021-01-02 DIAGNOSIS — IMO0002 Reserved for concepts with insufficient information to code with codable children: Secondary | ICD-10-CM

## 2021-01-02 DIAGNOSIS — E1165 Type 2 diabetes mellitus with hyperglycemia: Secondary | ICD-10-CM

## 2021-01-02 MED ORDER — BLOOD GLUCOSE MONITOR KIT: PACK | 0 refills | Status: DC

## 2021-01-05 ENCOUNTER — Other Ambulatory Visit: Payer: Self-pay | Admitting: Family Medicine

## 2021-01-05 DIAGNOSIS — M5441 Lumbago with sciatica, right side: Secondary | ICD-10-CM

## 2021-01-05 DIAGNOSIS — E1122 Type 2 diabetes mellitus with diabetic chronic kidney disease: Secondary | ICD-10-CM

## 2021-01-06 MED ORDER — OZEMPIC (0.25 OR 0.5 MG/DOSE) 2 MG/1.5ML ~~LOC~~ SOPN: 0.5000 mg | PEN_INJECTOR | SUBCUTANEOUS | 2 refills | Status: DC

## 2021-01-06 MED ORDER — CYCLOBENZAPRINE HCL 5 MG PO TABS: 5.0000 mg | ORAL_TABLET | Freq: Every day | ORAL | 0 refills | Status: DC

## 2021-01-11 ENCOUNTER — Other Ambulatory Visit: Payer: Self-pay | Admitting: Family Medicine

## 2021-01-22 ENCOUNTER — Encounter: Payer: Self-pay | Admitting: Podiatry

## 2021-01-22 ENCOUNTER — Other Ambulatory Visit: Payer: Self-pay

## 2021-01-22 ENCOUNTER — Ambulatory Visit (INDEPENDENT_AMBULATORY_CARE_PROVIDER_SITE_OTHER): Payer: BC Managed Care – PPO | Admitting: Podiatry

## 2021-01-22 ENCOUNTER — Other Ambulatory Visit: Payer: Self-pay | Admitting: Family Medicine

## 2021-01-22 DIAGNOSIS — E119 Type 2 diabetes mellitus without complications: Secondary | ICD-10-CM

## 2021-01-22 NOTE — Progress Notes (Signed)
This patient presents to the office with chief complaint  diabetic feet.  This patient  says there  is  no pain and discomfort in her feet.    Patient has no history of infection or drainage from both feet.   . This patient presents  to the office today for  a foot evaluation due to history of  diabetes.  General Appearance  Alert, conversant and in no acute stress.  Vascular  Dorsalis pedis and posterior tibial  pulses are palpable  bilaterally.  Capillary return is within normal limits  bilaterally. Temperature is within normal limits  bilaterally.  Neurologic  Senn-Weinstein monofilament wire test within normal limits  bilaterally. Muscle power within normal limits bilaterally.  Nails Thick disfigured discolored nails with subungual debris  from hallux to fifth toes bilaterally. No evidence of bacterial infection or drainage bilaterally. Pincer hallux nails  B/L.  Orthopedic  No limitations of motion of motion feet .  No crepitus or effusions noted.  No bony pathology or digital deformities noted.  Skin  normotropic skin with no porokeratosis noted bilaterally.  No signs of infections or ulcers noted.  Heel fissures left heel.   Diabetes with no foot  complications.    IE    A diabetic foot exam was performed and there is no evidence of any vascular or neurologic pathology.  Told her to use chapstick for her fissures.  RTC 1 year.Gardiner Barefoot DPM

## 2021-01-23 ENCOUNTER — Ambulatory Visit: Payer: BC Managed Care – PPO | Admitting: Family Medicine

## 2021-01-28 ENCOUNTER — Ambulatory Visit: Payer: BC Managed Care – PPO | Admitting: Student

## 2021-01-31 ENCOUNTER — Inpatient Hospital Stay: Admission: RE | Admit: 2021-01-31 | Payer: BC Managed Care – PPO | Source: Ambulatory Visit

## 2021-02-04 ENCOUNTER — Ambulatory Visit: Payer: BC Managed Care – PPO

## 2021-02-17 ENCOUNTER — Other Ambulatory Visit: Payer: Self-pay

## 2021-02-17 ENCOUNTER — Encounter: Payer: Self-pay | Admitting: Physician Assistant

## 2021-02-17 ENCOUNTER — Ambulatory Visit (INDEPENDENT_AMBULATORY_CARE_PROVIDER_SITE_OTHER): Payer: 59 | Admitting: Physician Assistant

## 2021-02-17 VITALS — BP 138/80 | HR 87 | Ht 66.0 in | Wt 197.0 lb

## 2021-02-17 DIAGNOSIS — E785 Hyperlipidemia, unspecified: Secondary | ICD-10-CM

## 2021-02-17 DIAGNOSIS — E119 Type 2 diabetes mellitus without complications: Secondary | ICD-10-CM

## 2021-02-17 DIAGNOSIS — N184 Chronic kidney disease, stage 4 (severe): Secondary | ICD-10-CM | POA: Diagnosis not present

## 2021-02-17 DIAGNOSIS — I2510 Atherosclerotic heart disease of native coronary artery without angina pectoris: Secondary | ICD-10-CM

## 2021-02-17 DIAGNOSIS — Z794 Long term (current) use of insulin: Secondary | ICD-10-CM

## 2021-02-17 DIAGNOSIS — I10 Essential (primary) hypertension: Secondary | ICD-10-CM

## 2021-02-17 LAB — LIPID PANEL
Chol/HDL Ratio: 4 ratio (ref 0.0–4.4)
Cholesterol, Total: 151 mg/dL (ref 100–199)
HDL: 38 mg/dL — ABNORMAL LOW (ref >39)
LDL Chol Calc (NIH): 72 mg/dL (ref 0–99)
Triglycerides: 251 mg/dL — ABNORMAL HIGH (ref 0–149)
VLDL Cholesterol Cal: 41 mg/dL — ABNORMAL HIGH (ref 5–40)

## 2021-02-17 LAB — COMPREHENSIVE METABOLIC PANEL
ALT: 31 IU/L (ref 0–32)
AST: 20 IU/L (ref 0–40)
Albumin/Globulin Ratio: 1.4 (ref 1.2–2.2)
Albumin: 4.6 g/dL (ref 3.8–4.9)
Alkaline Phosphatase: 51 IU/L (ref 44–121)
BUN/Creatinine Ratio: 11 (ref 9–23)
BUN: 23 mg/dL (ref 6–24)
Bilirubin Total: 0.4 mg/dL (ref 0.0–1.2)
CO2: 18 mmol/L — ABNORMAL LOW (ref 20–29)
Calcium: 9.5 mg/dL (ref 8.7–10.2)
Chloride: 103 mmol/L (ref 96–106)
Creatinine, Ser: 2.14 mg/dL — ABNORMAL HIGH (ref 0.57–1.00)
Globulin, Total: 3.4 g/dL (ref 1.5–4.5)
Glucose: 136 mg/dL — ABNORMAL HIGH (ref 65–99)
Potassium: 4.2 mmol/L (ref 3.5–5.2)
Sodium: 138 mmol/L (ref 134–144)
Total Protein: 8 g/dL (ref 6.0–8.5)
eGFR: 27 mL/min/{1.73_m2} — ABNORMAL LOW (ref >59)

## 2021-02-17 NOTE — Progress Notes (Signed)
Cardiology Office Note:    Date:  02/19/2021   ID:  Casey Acosta, DOB 03/10/1971, MRN [id number]  PCP:  Billie Ruddy, MD   [facility] Providers Cardiologist:  Kirk Ruths, MD     Referring MD: Billie Ruddy, MD   Chief Complaint  Patient presents with   Follow-up    History of Present Illness:    Casey Acosta is a 50 y.o. female with a hx of hypertension, hyperlipidemia, insulin-dependent diabetes, chronic renal insufficiency and CAD.  Patient was initially seen by cardiology service in September 2021 when she presented with uncontrolled hypertension and elevated troponin and chest pain.  Her troponin was initially felt to be due to demand ischemia.  Echocardiogram suggested possible mass on her aortic valve, TEE obtained on 05/27/2020 showed EF 60 to 65%, no left atrial appendage thrombus was detected, trivial MR, nodular thickening of the edge of the left coronary cusp was noted, this was felt not to be clinically significant.  Myoview was low risk.  She then presented back to the hospital in August 19, 2020 off of her medications.  She was ruled in for inferolateral STEMI.  High-sensitivity troponin was greater than 27,000.  Cardiac catheterization revealed diffuse coronary artery disease.  Culprit lesion was felt to be left circumflex artery that was stented.  She had her residual disease in the RCA and LAD of moderate degree.  Echocardiogram showed normal EF, again the small mass was noted attached to the aortic valve, it is not freely mobile and moved with a leaflet.  In the previous TEE, this was felt to be nodular thickening and another papillary fibroblastoma.  Creatinine was 2.5.  Patient was last seen by Kerin Ransom on 08/14/2020 at which time she was doing well.  She was referred to nephrology service and their recommended follow-up in 4 to 6 weeks.  Unfortunately patient never followed up.  Patient presents today for a very delayed follow-up.  She says she had  COVID earlier this year and missed her nephrology appointment.  I will refer her to nephrology service again.  She denies any recent chest pain or worsening dyspnea.  She appears to be euvolemic on physical exam.  Blood pressure is fairly controlled on current therapy.  She is due for fasting lipid panel and CMP, I suspect her cholesterol may be still elevated, if not at goal, I will refer her to Dr. Debara Pickett to consider PCSK9 inhibitor.  Otherwise she can follow-up with Dr. Stanford Breed in 3 months.    Past Medical History:  Diagnosis Date   Anemia    Anxiety    Anxiety and depression    Chronic kidney disease    Colon polyp    Diabetes mellitus without complication ([place])    Dysuria    High risk sexual behavior    History of syncope    Hyperlipidemia    Hypertension    Internal hemorrhoids    LVH (left ventricular hypertrophy)    Malaise and fatigue    Obesity (BMI 30-39.9)    Ovarian mass    Panic disorder    PCOS (polycystic ovarian syndrome)    Rectal bleeding    Shortness of breath    Surgical menopause    Vaginal trichomoniasis     Past Surgical History:  Procedure Laterality Date   ABDOMINAL HYSTERECTOMY     partial   APPENDECTOMY     CHOLECYSTECTOMY     CORONARY/GRAFT ACUTE MI REVASCULARIZATION N/A 08/04/2020   Procedure: Coronary/Graft  Acute MI Revascularization;  Surgeon: Martinique, Peter M, MD;  Location: [facility];  Service: Cardiovascular;  Laterality: N/A;   LEFT HEART CATH AND CORONARY ANGIOGRAPHY N/A 08/04/2020   Procedure: LEFT HEART CATH AND CORONARY ANGIOGRAPHY;  Surgeon: Martinique, Peter M, MD;  Location: [facility];  Service: Cardiovascular;  Laterality: N/A;   TEE WITHOUT CARDIOVERSION N/A 05/27/2020   Procedure: TRANSESOPHAGEAL ECHOCARDIOGRAM (TEE);  Surgeon: Josue Hector, MD;  Location: [facility];  Service: Cardiovascular;  Laterality: N/A;   TUBAL LIGATION      Current Medications: Current Meds  Medication Sig   amLODipine (NORVASC) 10  MG tablet Take 1 tablet (10 mg total) by mouth daily.   aspirin EC 81 MG tablet Take 81 mg by mouth daily.   carvedilol (COREG) 25 MG tablet TAKE ONE TABLET BY MOUTH TWICE A DAY WITH A MEAL   cloNIDine (CATAPRES - DOSED IN MG/24 HR) 0.1 mg/24hr patch Place 1 patch (0.1 mg total) onto the skin once a week.   cyclobenzaprine (FLEXERIL) 5 MG tablet Take 1 tablet (5 mg total) by mouth at bedtime.   glimepiride (AMARYL) 1 MG tablet Take 2 tablets (2 mg total) by mouth daily with breakfast.   glucose blood test strip One-step Ultra meter.  Use as instructed   hydrALAZINE (APRESOLINE) 100 MG tablet Take 1 tablet (100 mg total) by mouth 4 (four) times daily.   hydrOXYzine (ATARAX/VISTARIL) 50 MG tablet Take 1 tablet (50 mg total) by mouth every 6 (six) hours as needed for anxiety.   nitroGLYCERIN (NITROSTAT) 0.4 MG SL tablet Place 1 tablet (0.4 mg total) under the tongue every 5 (five) minutes as needed.   rosuvastatin (CRESTOR) 40 MG tablet Take 1 tablet (40 mg total) by mouth daily.   Semaglutide,0.25 or 0.5MG /DOS, (OZEMPIC, 0.25 OR 0.5 MG/DOSE,) 2 MG/1.5ML SOPN Inject 0.5 mg into the skin once a week.   sertraline (ZOLOFT) 50 MG tablet Take 1 tablet (50 mg total) by mouth daily.   ticagrelor (BRILINTA) 90 MG TABS tablet Take 1 tablet (90 mg total) by mouth 2 (two) times daily.     Allergies:   Dilaudid [hydromorphone] and Prozac [fluoxetine hcl]   Social History   Socioeconomic History   Marital status: Significant Other    Spouse name: Not on file   Number of children: Not on file   Years of education: Not on file   Highest education level: Not on file  Occupational History   Not on file  Tobacco Use   Smoking status: Never   Smokeless tobacco: Never  Vaping Use   Vaping Use: Never used  Substance and Sexual Activity   Alcohol use: Yes    Comment: Occas. wine   Drug use: No   Sexual activity: Yes    Birth control/protection: Surgical    Comment: HYST.-1st intercourse 50 yo-More  than 5 partners  Other Topics Concern   Not on file  Social History Narrative   Not on file   Social Determinants of Health   Financial Resource Strain: Not on file  Food Insecurity: Not on file  Transportation Needs: Not on file  Physical Activity: Not on file  Stress: Not on file  Social Connections: Not on file     Family History: The patient's family history includes ALS in her father; Anxiety disorder in her mother; Crohn's disease in her brother and sister; Depression in her mother; Diabetes in her mother and sister; Hyperlipidemia in her mother; Hypertension in her  mother and sister; Thyroid disease in her mother.  ROS:   Please see the history of present illness.     All other systems reviewed and are negative.  EKGs/Labs/Other Studies Reviewed:    The following studies were reviewed today:  Cath 08/04/2020 Prox LAD lesion is 60% stenosed. Mid LAD lesion is 50% stenosed. 1st Diag lesion is 80% stenosed. Ramus lesion is 50% stenosed. Mid Cx to Dist Cx lesion is 100% stenosed. Prox RCA to Mid RCA lesion is 80% stenosed. Dist RCA lesion is 50% stenosed. RPDA lesion is 40% stenosed. LPAV lesion is 80% stenosed. 3rd LPL lesion is 90% stenosed. 1st LPL lesion is 50% stenosed. A drug-eluting stent was successfully placed using a STENT RESOLUTE ONYX 3.5X18. Post intervention, there is a 0% residual stenosis. LV end diastolic pressure is normal.   1. Diffusely diseased diabetic coronary arteries with acute occlusion of the mid to distal LCx. This is a codominant vessel. Otherwise moderate 3 vessel disease. 2. Normal LVEDP 3. Successful PCI of the mid LCx with overlapping DES x 2   Plan: DAPT for at least one year. Aggressive risk factor modification and antianginal therapy. At this point would treat her residual disease medically. If she should have refractory angina in the future would need to be considered for CABG.    EKG:  EKG is ordered today.  The ekg ordered  today demonstrates normal sinus rhythm, no significant ST-T wave changes.  Recent Labs: 05/24/2020: B Natriuretic Peptide 166.1 05/27/2020: TSH 1.089; TSH 1.148 08/04/2020: Magnesium 1.8 08/06/2020: Hemoglobin 9.3; Platelets 308 02/17/2021: ALT 31; BUN 23; Creatinine, Ser 2.14; Potassium 4.2; Sodium 138  Recent Lipid Panel    Component Value Date/Time   CHOL 151 02/17/2021 1116   TRIG 251 (H) 02/17/2021 1116   HDL 38 (L) 02/17/2021 1116   CHOLHDL 4.0 02/17/2021 1116   CHOLHDL 8.9 08/04/2020 0109   VLDL UNABLE TO CALCULATE IF TRIGLYCERIDE OVER 400 mg/dL 08/04/2020 0109   LDLCALC 72 02/17/2021 1116   LDLDIRECT 206.7 (H) 08/04/2020 0109     Risk Assessment/Calculations:           Physical Exam:    VS:  BP 138/80 (BP Location: Right Arm, Patient Position: Sitting, Cuff Size: Large)   Pulse 87   Ht 5\' 6"  (1.676 m)   Wt 197 lb (89.4 kg)   BMI 31.80 kg/m     Wt Readings from Last 3 Encounters:  02/17/21 197 lb (89.4 kg)  12/02/20 209 lb (94.8 kg)  09/09/20 203 lb 9.6 oz (92.4 kg)     GEN:  Well nourished, well developed in no acute distress HEENT: Normal NECK: No JVD; No carotid bruits LYMPHATICS: No lymphadenopathy CARDIAC: RRR, no murmurs, rubs, gallops RESPIRATORY:  Clear to auscultation without rales, wheezing or rhonchi  ABDOMEN: Soft, non-tender, non-distended MUSCULOSKELETAL:  No edema; No deformity  SKIN: Warm and dry NEUROLOGIC:  Alert and oriented x 3 PSYCHIATRIC:  Normal affect   ASSESSMENT:    1. Coronary artery disease involving native coronary artery of native heart without angina pectoris   2. CKD (chronic kidney disease) stage 4, GFR 15-29 ml/min (HCC)   3. Hyperlipidemia, unspecified hyperlipidemia type   4. Essential hypertension   5. Controlled type 2 diabetes mellitus without complication, with long-term current use of insulin (HCC)    PLAN:    In order of problems listed above:  CAD: Last PCI in December 2021.  Denies any recent chest pain.   Continue aspirin and Brilinta.  CKD stage IV: Patient was previously referred to nephrology service during the last visit, however follow-up with nephrology service was delayed due to COVID-19 infection.  I made another referral to nephrology service.  Hyperlipidemia: On Crestor.  Continue fasting lipid panel and a CMP.  Hypertension: Blood pressure stable  DM2: Managed by primary care provider.        Medication Adjustments/Labs and Tests Ordered: Current medicines are reviewed at length with the patient today.  Concerns regarding medicines are outlined above.  Orders Placed This Encounter  Procedures   Lipid panel   Comprehensive metabolic panel   Ambulatory referral to Nephrology   EKG 12-Lead   No orders of the defined types were placed in this encounter.   Patient Instructions  Medication Instructions:  Your physician recommends that you continue on your current medications as directed. Please refer to the Current Medication list given to you today.  *If you need a refill on your cardiac medications before your next appointment, please call your pharmacy*  Lab Work: Your physician recommends that you return for lab work TODAY:  CMP FLP If you have labs (blood work) drawn today and your tests are completely normal, you will receive your results only by: [place] (if you have [place]) OR A paper copy in the mail If you have any lab test that is abnormal or we need to change your treatment, we will call you to review the results.  Testing/Procedures: NONE ordered at this time of appointment   Follow-Up: At [facility], you and your health needs are our priority.  As part of our continuing mission to provide you with exceptional heart care, we have created designated Provider Care Teams.  These Care Teams include your primary Cardiologist (physician) and Advanced Practice Providers (APPs -  Physician Assistants and Nurse Practitioners) who all work together to  provide you with the care you need, when you need it.   Your next appointment:   3 month(s)  The format for your next appointment:   In Person  Provider:   Kirk Ruths, MD  Other Instructions     Signed, Almyra Deforest, [place]  02/19/2021 10:09 PM    [place]

## 2021-02-17 NOTE — Patient Instructions (Signed)
Medication Instructions:  Your physician recommends that you continue on your current medications as directed. Please refer to the Current Medication list given to you today.  *If you need a refill on your cardiac medications before your next appointment, please call your pharmacy*  Lab Work: Your physician recommends that you return for lab work TODAY:  CMP FLP If you have labs (blood work) drawn today and your tests are completely normal, you will receive your results only by: [place] (if you have [place]) OR A paper copy in the mail If you have any lab test that is abnormal or we need to change your treatment, we will call you to review the results.  Testing/Procedures: NONE ordered at this time of appointment   Follow-Up: At [facility], you and your health needs are our priority.  As part of our continuing mission to provide you with exceptional heart care, we have created designated Provider Care Teams.  These Care Teams include your primary Cardiologist (physician) and Advanced Practice Providers (APPs -  Physician Assistants and Nurse Practitioners) who all work together to provide you with the care you need, when you need it.   Your next appointment:   3 month(s)  The format for your next appointment:   In Person  Provider:   Kirk Ruths, MD  Other Instructions

## 2021-02-20 NOTE — Progress Notes (Signed)
Kidney function slowly improving. Triglyceride still high , but improved when compare to 6 month ago, total cholesterol and LDL controlled. Liver function normal.

## 2021-02-20 NOTE — Progress Notes (Signed)
Sent message through mychart

## 2021-03-10 ENCOUNTER — Other Ambulatory Visit: Payer: Self-pay | Admitting: Cardiology

## 2021-03-13 ENCOUNTER — Other Ambulatory Visit: Payer: Self-pay

## 2021-03-13 ENCOUNTER — Ambulatory Visit
Admission: RE | Admit: 2021-03-13 | Discharge: 2021-03-13 | Disposition: A | Discharge status: Home / Self Care | Payer: BC Managed Care – PPO | Source: Ambulatory Visit | Attending: Family Medicine | Admitting: Family Medicine

## 2021-03-13 DIAGNOSIS — Z1231 Encounter for screening mammogram for malignant neoplasm of breast: Secondary | ICD-10-CM

## 2021-04-05 ENCOUNTER — Other Ambulatory Visit: Payer: Self-pay | Admitting: Family Medicine

## 2021-04-05 DIAGNOSIS — F32A Depression, unspecified: Secondary | ICD-10-CM

## 2021-04-05 DIAGNOSIS — F419 Anxiety disorder, unspecified: Secondary | ICD-10-CM

## 2021-04-18 ENCOUNTER — Ambulatory Visit: Payer: 59 | Admitting: Family Medicine

## 2021-04-19 ENCOUNTER — Other Ambulatory Visit: Payer: Self-pay | Admitting: Family Medicine

## 2021-04-19 DIAGNOSIS — N184 Chronic kidney disease, stage 4 (severe): Secondary | ICD-10-CM

## 2021-04-23 ENCOUNTER — Other Ambulatory Visit: Payer: Self-pay | Admitting: Family Medicine

## 2021-04-23 DIAGNOSIS — E1169 Type 2 diabetes mellitus with other specified complication: Secondary | ICD-10-CM

## 2021-05-02 ENCOUNTER — Other Ambulatory Visit: Payer: Self-pay | Admitting: Family Medicine

## 2021-05-02 DIAGNOSIS — M5441 Lumbago with sciatica, right side: Secondary | ICD-10-CM

## 2021-05-02 DIAGNOSIS — M5442 Lumbago with sciatica, left side: Secondary | ICD-10-CM

## 2021-05-21 ENCOUNTER — Telehealth: Payer: Self-pay | Admitting: Radiology

## 2021-05-21 NOTE — Telephone Encounter (Signed)
Left voicemail to call [facility] to confirm insurance information- note pt requested termination for [facility]. Also to confirm this is only wellness visit and to possible reschedule

## 2021-05-26 ENCOUNTER — Telehealth: Payer: Self-pay | Admitting: Cardiology

## 2021-05-26 NOTE — Telephone Encounter (Signed)
Pt c/o of Chest Pain: STAT if CP now or developed within 24 hours  1. Are you having CP right now? NO  2. Are you experiencing any other symptoms (ex. SOB, nausea, vomiting, sweating)? NO TO ALL  3. How long have you been experiencing CP? STARTED ON 05/25/21 BUT NOT HAVING CP NOW  4. Is your CP continuous or coming and going? COMING AND GOING  5. Have you taken Nitroglycerin? YESTERDAY PT TOOK 1 NITRO  ?

## 2021-05-26 NOTE — Telephone Encounter (Signed)
Returned the call to the patient. She stated that she did not go to the ED because the pain had subsided and she was at work. She denies current pain and shortness of breath. She has been made an appointment for tomorrow with instructions to go to the ED if she developed chest pain. She verbalized her understanding.

## 2021-05-26 NOTE — Telephone Encounter (Signed)
Casey Acosta is calling requesting to be worked in for an appointment. She is hoping to be seen in the office tomorrow in regards to this.

## 2021-05-26 NOTE — Telephone Encounter (Signed)
Pt called to report that she started having on and off chest pain, dull in nature mostly at rest since yesterday.   She has had some SOB associated with it.   She says she had to take a nitro yesterday and it relieved her pain. She is having the pain now and is going to take another nitro.   I have suggested that she goes to the ED for more immediate assessment since she is not feeling well and she is actively having chest pain.   She agreed and will call EMS or have someone drive her but weill not take herself.

## 2021-05-27 ENCOUNTER — Encounter: Payer: Self-pay | Admitting: Physician Assistant

## 2021-05-27 ENCOUNTER — Ambulatory Visit (INDEPENDENT_AMBULATORY_CARE_PROVIDER_SITE_OTHER): Payer: 59 | Admitting: Physician Assistant

## 2021-05-27 ENCOUNTER — Other Ambulatory Visit: Payer: Self-pay

## 2021-05-27 VITALS — BP 130/77 | HR 76 | Ht 66.0 in | Wt 200.6 lb

## 2021-05-27 DIAGNOSIS — I5189 Other ill-defined heart diseases: Secondary | ICD-10-CM

## 2021-05-27 DIAGNOSIS — I25119 Atherosclerotic heart disease of native coronary artery with unspecified angina pectoris: Secondary | ICD-10-CM

## 2021-05-27 DIAGNOSIS — Z794 Long term (current) use of insulin: Secondary | ICD-10-CM

## 2021-05-27 DIAGNOSIS — E785 Hyperlipidemia, unspecified: Secondary | ICD-10-CM

## 2021-05-27 DIAGNOSIS — E119 Type 2 diabetes mellitus without complications: Secondary | ICD-10-CM

## 2021-05-27 DIAGNOSIS — R072 Precordial pain: Secondary | ICD-10-CM

## 2021-05-27 DIAGNOSIS — I10 Essential (primary) hypertension: Secondary | ICD-10-CM

## 2021-05-27 DIAGNOSIS — N184 Chronic kidney disease, stage 4 (severe): Secondary | ICD-10-CM | POA: Diagnosis not present

## 2021-05-27 MED ORDER — NITROGLYCERIN 0.4 MG SL SUBL
0.4000 mg | SUBLINGUAL_TABLET | SUBLINGUAL | 2 refills | Status: DC | PRN
Start: 2021-05-27 — End: 2022-06-18

## 2021-05-27 NOTE — Patient Instructions (Signed)
Medication Instructions:  Your physician recommends that you continue on your current medications as directed. Please refer to the Current Medication list given to you today.  *If you need a refill on your cardiac medications before your next appointment, please call your pharmacy*  Lab Work: NONE ordered at this time of appointment   If you have labs (blood work) drawn today and your tests are completely normal, you will receive your results only by: [place] (if you have MyChart) OR A paper copy in the mail If you have any lab test that is abnormal or we need to change your treatment, we will call you to review the results.  Testing/Procedures: Your physician has requested that you have an echocardiogram. Echocardiography is a painless test that uses sound waves to create images of your heart. It provides your doctor with information about the size and shape of your heart and how well your heart's chambers and valves are working. This procedure takes approximately one hour. There are no restrictions for this procedure.  Please schedule within 1 month   Follow-Up: At [facility], you and your health needs are our priority.  As part of our continuing mission to provide you with exceptional heart care, we have created designated Provider Care Teams.  These Care Teams include your primary Cardiologist (physician) and Advanced Practice Providers (APPs -  Physician Assistants and Nurse Practitioners) who all work together to provide you with the care you need, when you need it.   Your next appointment:   As scheduled    The format for your next appointment:   In Person  Provider:   Kirk Ruths, MD  Other Instructions

## 2021-05-27 NOTE — Progress Notes (Signed)
Cardiology Office Note:    Date:  05/29/2021   ID:  Casey Acosta, DOB 17-Oct-1970, MRN [id number]  PCP:  Billie Ruddy, MD   [facility] Providers Cardiologist:  Kirk Ruths, MD     Referring MD: Billie Ruddy, MD   Chief Complaint  Patient presents with   Follow-up    Seen for Dr. Stanford Breed    History of Present Illness:    Casey Acosta is a 50 y.o. female with a hx of HTN, HLD, insulin-dependent diabetes, CKD stage IV and CAD.  Patient was initially seen by cardiology service in September 2021 when she presented with uncontrolled hypertension and elevated troponin and chest pain.  Her troponin was initially felt to be due to demand ischemia.  Echocardiogram suggested possible mass on her aortic valve, TEE obtained on 05/27/2020 showed EF 60 to 65%, no left atrial appendage thrombus was detected, trivial MR, nodular thickening of the edge of the left coronary cusp was noted, this was felt not to be clinically significant.  Myoview was low risk.  She then presented back to the hospital in August 19, 2020 off of her medications.  She was ruled in for inferolateral STEMI.  High-sensitivity troponin was greater than 27,000.  Cardiac catheterization revealed diffuse coronary artery disease.  Culprit lesion was felt to be left circumflex artery that was stented.  She had her residual disease in the RCA and LAD of moderate degree.  Echocardiogram showed normal EF, again the small mass was noted attached to the aortic valve, it is not freely mobile and moved with a leaflet.  In the previous TEE, this was felt to be nodular thickening and another papillary fibroblastoma.  Creatinine was 2.5.  Patient was last seen by Kerin Ransom on 08/14/2020 at which time she was doing well.  She was referred to nephrology service and recommended follow-up in 4 to 6 weeks.  Unfortunately patient never followed up.  I last saw the patient in June 2022, she still has not seen nephrology service at that  time, I referred her to nephrology again.  Repeat blood work showed fairly controlled LDL, total cholesterol, low HDL and elevated triglyceride.  Patient presents today for evaluation of chest pain under the left breast that occurred on this past Sunday.  Symptoms started in some afternoon and intermittent until Monday morning.  She took a single dose of nitroglycerin on Monday morning and the symptom eventually went away and has not came back since.  Symptom occurred at rest.  Characteristic of chest pain is different for the previous angina.  Previous angina at the time of heart attack in December 2021 was bilateral shoulder pain, there was no chest pain at the time.  EKG obtained today showed no obvious ischemic changes when compared to the last EKG in June.  I recommended limited echocardiogram to look at wall motion and ejection fraction, if normal, I would not recommend any further work-up.  She should continue to observe her symptoms, if her chest pain recurs, we can consider additional ischemic work-up.  I did not order stress test as she had a normal stress test prior to her heart attack last year.   Past Medical History:  Diagnosis Date   Anemia    Anxiety    Anxiety and depression    Chronic kidney disease    Colon polyp    Diabetes mellitus without complication ([place])    Dysuria    High risk sexual behavior    History of  syncope    Hyperlipidemia    Hypertension    Internal hemorrhoids    LVH (left ventricular hypertrophy)    Malaise and fatigue    Obesity (BMI 30-39.9)    Ovarian mass    Panic disorder    PCOS (polycystic ovarian syndrome)    Rectal bleeding    Shortness of breath    Surgical menopause    Vaginal trichomoniasis     Past Surgical History:  Procedure Laterality Date   ABDOMINAL HYSTERECTOMY     partial   APPENDECTOMY     CHOLECYSTECTOMY     CORONARY/GRAFT ACUTE MI REVASCULARIZATION N/A 08/04/2020   Procedure: Coronary/Graft Acute MI Revascularization;   Surgeon: Martinique, Peter M, MD;  Location: [facility];  Service: Cardiovascular;  Laterality: N/A;   LEFT HEART CATH AND CORONARY ANGIOGRAPHY N/A 08/04/2020   Procedure: LEFT HEART CATH AND CORONARY ANGIOGRAPHY;  Surgeon: Martinique, Peter M, MD;  Location: [facility];  Service: Cardiovascular;  Laterality: N/A;   TEE WITHOUT CARDIOVERSION N/A 05/27/2020   Procedure: TRANSESOPHAGEAL ECHOCARDIOGRAM (TEE);  Surgeon: Josue Hector, MD;  Location: [facility];  Service: Cardiovascular;  Laterality: N/A;   TUBAL LIGATION      Current Medications: Current Meds  Medication Sig   amLODipine (NORVASC) 10 MG tablet Take 1 tablet (10 mg total) by mouth daily.   aspirin EC 81 MG tablet Take 81 mg by mouth daily.   carvedilol (COREG) 25 MG tablet TAKE ONE TABLET BY MOUTH TWICE A DAY WITH A MEAL   cloNIDine (CATAPRES - DOSED IN MG/24 HR) 0.1 mg/24hr patch Place 1 patch (0.1 mg total) onto the skin once a week.   cyclobenzaprine (FLEXERIL) 5 MG tablet Take 1 tablet (5 mg total) by mouth at bedtime.   glimepiride (AMARYL) 1 MG tablet TAKE TWO TABLETS BY MOUTH DAILY WITH BREAKFAST   glucose blood test strip One-step Ultra meter.  Use as instructed   hydrALAZINE (APRESOLINE) 100 MG tablet Take 1 tablet (100 mg total) by mouth 4 (four) times daily.   hydrOXYzine (ATARAX/VISTARIL) 50 MG tablet Take 1 tablet (50 mg total) by mouth every 6 (six) hours as needed for anxiety.   OZEMPIC, 0.25 OR 0.5 MG/DOSE, 2 MG/1.5ML SOPN DIAL AND INJECT UNDER THE SKIN 0.5 MG WEEKLY   rosuvastatin (CRESTOR) 40 MG tablet Take 1 tablet (40 mg total) by mouth daily.   sertraline (ZOLOFT) 50 MG tablet TAKE ONE TABLET BY MOUTH DAILY   ticagrelor (BRILINTA) 90 MG TABS tablet TAKE ONE TABLET BY MOUTH TWICE A DAY   [DISCONTINUED] nitroGLYCERIN (NITROSTAT) 0.4 MG SL tablet Place 1 tablet (0.4 mg total) under the tongue every 5 (five) minutes as needed.     Allergies:   Dilaudid [hydromorphone] and Prozac [fluoxetine hcl]    Social History   Socioeconomic History   Marital status: Married    Spouse name: Not on file   Number of children: Not on file   Years of education: Not on file   Highest education level: Not on file  Occupational History   Not on file  Tobacco Use   Smoking status: Never   Smokeless tobacco: Never  Vaping Use   Vaping Use: Never used  Substance and Sexual Activity   Alcohol use: Yes    Comment: Occas. wine   Drug use: No   Sexual activity: Yes    Birth control/protection: Surgical    Comment: HYST.-1st intercourse 50 yo-More than 5 partners  Other Topics Concern  Not on file  Social History Narrative   Not on file   Social Determinants of Health   Financial Resource Strain: Not on file  Food Insecurity: Not on file  Transportation Needs: Not on file  Physical Activity: Not on file  Stress: Not on file  Social Connections: Not on file     Family History: The patient's family history includes ALS in her father; Anxiety disorder in her mother; Breast cancer in her maternal aunt, maternal aunt, and mother; Crohn's disease in her brother and sister; Depression in her mother; Diabetes in her mother and sister; Hyperlipidemia in her mother; Hypertension in her mother and sister; Thyroid disease in her mother.  ROS:   Please see the history of present illness.     All other systems reviewed and are negative.  EKGs/Labs/Other Studies Reviewed:    The following studies were reviewed today:  Echo 08/05/2020   1. There is a small mass (0.6 cm x 0.5 cm) attached to the [facility] of the  aortic valve. The lesion is not freely mobile and moves with the leaflet.  Recent TEE reported this to be nodular thickening and not a papillary  fibroelastoma. The aortic valve is  tricuspid. Aortic valve regurgitation is not visualized. No aortic  stenosis is present.   2. Left ventricular ejection fraction, by estimation, is 55 to 60%. The  left ventricle has normal function. The left  ventricle has no regional  wall motion abnormalities. There is severe concentric left ventricular  hypertrophy. Left ventricular diastolic   parameters are consistent with Grade II diastolic dysfunction  (pseudonormalization). Elevated left atrial pressure. The E/e' is 19.0.   3. Right ventricular systolic function is normal. The right ventricular  size is normal. Tricuspid regurgitation signal is inadequate for assessing  PA pressure.   4. A small pericardial effusion is present. The pericardial effusion is  circumferential. There is no evidence of cardiac tamponade.   5. The mitral valve is grossly normal. Mild mitral valve regurgitation.  No evidence of mitral stenosis.   6. The inferior vena cava is normal in size with greater than 50%  respiratory variability, suggesting right atrial pressure of 3 mmHg.   Comparison(s): No significant change from prior study.   EKG:  EKG is ordered today.  The ekg ordered today demonstrates normal sinus rhythm, no significant ST-T wave changes  Recent Labs: 08/04/2020: Magnesium 1.8 08/06/2020: Hemoglobin 9.3; Platelets 308 02/17/2021: ALT 31; BUN 23; Creatinine, Ser 2.14; Potassium 4.2; Sodium 138  Recent Lipid Panel    Component Value Date/Time   CHOL 151 02/17/2021 1116   TRIG 251 (H) 02/17/2021 1116   HDL 38 (L) 02/17/2021 1116   CHOLHDL 4.0 02/17/2021 1116   CHOLHDL 8.9 08/04/2020 0109   VLDL UNABLE TO CALCULATE IF TRIGLYCERIDE OVER 400 mg/dL 08/04/2020 0109   LDLCALC 72 02/17/2021 1116   LDLDIRECT 206.7 (H) 08/04/2020 0109     Risk Assessment/Calculations:           Physical Exam:    VS:  BP 130/77   Pulse 76   Ht 5\' 6"  (1.676 m)   Wt 200 lb 9.6 oz (91 kg)   SpO2 96%   BMI 32.38 kg/m     Wt Readings from Last 3 Encounters:  05/27/21 200 lb 9.6 oz (91 kg)  02/17/21 197 lb (89.4 kg)  12/02/20 209 lb (94.8 kg)     GEN:  Well nourished, well developed in no acute distress HEENT: Normal NECK: No JVD;  No carotid  bruits LYMPHATICS: No lymphadenopathy CARDIAC: RRR, no murmurs, rubs, gallops RESPIRATORY:  Clear to auscultation without rales, wheezing or rhonchi  ABDOMEN: Soft, non-tender, non-distended MUSCULOSKELETAL:  No edema; No deformity  SKIN: Warm and dry NEUROLOGIC:  Alert and oriented x 3 PSYCHIATRIC:  Normal affect   ASSESSMENT:    1. Precordial pain   2. Coronary artery disease involving native coronary artery of native heart with angina pectoris ([place])   3. Chronic kidney disease (CKD), stage IV (severe) ([place])   4. Primary hypertension   5. Hyperlipidemia LDL goal <70   6. Controlled type 2 diabetes mellitus without complication, with long-term current use of insulin (HCC)   7. Cardiac valve mass    PLAN:    In order of problems listed above:  Precordial chest pain: Symptom is different from the previous angina.  She never truly had chest pain with previous angina, she had bilateral shoulder pain instead.  Current symptoms started on Sunday and resolved by Monday morning.  Symptom has not recurred since.  She is not a great cath candidate given poor renal function.  Given the atypical nature of her symptoms, I recommend a limited echocardiogram to look at her wall motion and ejection fraction.  If EF is normal without any wall motion abnormality, I would not recommend any further work-up unless she has recurrent symptoms  CAD: Previous NSTEMI in December 2021.  Continue aspirin and Brilinta  CKD stage IV: Renal function stable on last lab work  Hypertension: Blood pressure stable  Hyperlipidemia: On Crestor  DM2: Managed by primary care provider  Previous aortic valve mass: Previous TEE in September 2021 revealed nodular thickening of the aortic valve, this was felt not to be clinically significant.  Echocardiogram since then continue to show possible aortic valve mass        Medication Adjustments/Labs and Tests Ordered: Current medicines are reviewed at length with the  patient today.  Concerns regarding medicines are outlined above.  Orders Placed This Encounter  Procedures   EKG 12-Lead   ECHOCARDIOGRAM LIMITED   Meds ordered this encounter  Medications   nitroGLYCERIN (NITROSTAT) 0.4 MG SL tablet    Sig: Place 1 tablet (0.4 mg total) under the tongue every 5 (five) minutes as needed.    Dispense:  25 tablet    Refill:  2    Patient Instructions  Medication Instructions:  Your physician recommends that you continue on your current medications as directed. Please refer to the Current Medication list given to you today.  *If you need a refill on your cardiac medications before your next appointment, please call your pharmacy*  Lab Work: NONE ordered at this time of appointment   If you have labs (blood work) drawn today and your tests are completely normal, you will receive your results only by: [place] (if you have MyChart) OR A paper copy in the mail If you have any lab test that is abnormal or we need to change your treatment, we will call you to review the results.  Testing/Procedures: Your physician has requested that you have an echocardiogram. Echocardiography is a painless test that uses sound waves to create images of your heart. It provides your doctor with information about the size and shape of your heart and how well your heart's chambers and valves are working. This procedure takes approximately one hour. There are no restrictions for this procedure.  Please schedule within 1 month   Follow-Up: At [facility], you and  your health needs are our priority.  As part of our continuing mission to provide you with exceptional heart care, we have created designated Provider Care Teams.  These Care Teams include your primary Cardiologist (physician) and Advanced Practice Providers (APPs -  Physician Assistants and Nurse Practitioners) who all work together to provide you with the care you need, when you need it.   Your next  appointment:   As scheduled    The format for your next appointment:   In Person  Provider:   Kirk Ruths, MD  Other Instructions    Signed, Almyra Deforest, [place]  05/29/2021 11:31 PM    [place]

## 2021-05-29 ENCOUNTER — Encounter: Payer: 59 | Admitting: Obstetrics & Gynecology

## 2021-05-29 ENCOUNTER — Encounter: Payer: Self-pay | Admitting: Physician Assistant

## 2021-06-03 ENCOUNTER — Telehealth: Payer: Self-pay | Admitting: Family Medicine

## 2021-06-03 ENCOUNTER — Other Ambulatory Visit: Payer: Self-pay | Admitting: Family Medicine

## 2021-06-03 DIAGNOSIS — M5441 Lumbago with sciatica, right side: Secondary | ICD-10-CM

## 2021-06-03 DIAGNOSIS — F419 Anxiety disorder, unspecified: Secondary | ICD-10-CM

## 2021-06-03 DIAGNOSIS — F32A Depression, unspecified: Secondary | ICD-10-CM

## 2021-06-03 DIAGNOSIS — E1169 Type 2 diabetes mellitus with other specified complication: Secondary | ICD-10-CM

## 2021-06-03 DIAGNOSIS — M5442 Lumbago with sciatica, left side: Secondary | ICD-10-CM

## 2021-06-03 NOTE — Telephone Encounter (Signed)
Patient called to make an appointment (currently scheduled for 10/10 with Dr. Volanda Napoleon) and says that she is needing refills on the following prescriptions: glimepiride (AMARYL) 1 MG tablet, cyclobenzaprine (FLEXERIL) 5 MG tablet, hydrOXYzine (ATARAX/VISTARIL) 50 MG tablet, amLODipine (NORVASC) 10 MG tablet, and rosuvastatin (CRESTOR) 40 MG tablet.  Patient uses [id number] - [place], [place].  Please advise.

## 2021-06-04 MED ORDER — GLIMEPIRIDE 1 MG PO TABS: ORAL_TABLET | ORAL | 0 refills | Status: DC

## 2021-06-04 MED ORDER — CYCLOBENZAPRINE HCL 5 MG PO TABS: 5.0000 mg | ORAL_TABLET | Freq: Every day | ORAL | 0 refills | Status: DC

## 2021-06-06 ENCOUNTER — Other Ambulatory Visit: Payer: Self-pay | Admitting: Family Medicine

## 2021-06-06 DIAGNOSIS — F419 Anxiety disorder, unspecified: Secondary | ICD-10-CM

## 2021-06-06 DIAGNOSIS — F32A Depression, unspecified: Secondary | ICD-10-CM

## 2021-06-09 ENCOUNTER — Other Ambulatory Visit: Payer: Self-pay

## 2021-06-09 ENCOUNTER — Encounter: Payer: Self-pay | Admitting: Family Medicine

## 2021-06-09 ENCOUNTER — Ambulatory Visit: Payer: 59 | Admitting: Family Medicine

## 2021-06-09 VITALS — BP 160/80 | HR 97 | Temp 99.2°F | Wt 201.0 lb

## 2021-06-09 DIAGNOSIS — N184 Chronic kidney disease, stage 4 (severe): Secondary | ICD-10-CM | POA: Diagnosis not present

## 2021-06-09 DIAGNOSIS — Z9861 Coronary angioplasty status: Secondary | ICD-10-CM

## 2021-06-09 DIAGNOSIS — E1169 Type 2 diabetes mellitus with other specified complication: Secondary | ICD-10-CM

## 2021-06-09 DIAGNOSIS — M5442 Lumbago with sciatica, left side: Secondary | ICD-10-CM | POA: Diagnosis not present

## 2021-06-09 DIAGNOSIS — M5441 Lumbago with sciatica, right side: Secondary | ICD-10-CM

## 2021-06-09 DIAGNOSIS — E1122 Type 2 diabetes mellitus with diabetic chronic kidney disease: Secondary | ICD-10-CM

## 2021-06-09 DIAGNOSIS — F419 Anxiety disorder, unspecified: Secondary | ICD-10-CM

## 2021-06-09 DIAGNOSIS — Z1211 Encounter for screening for malignant neoplasm of colon: Secondary | ICD-10-CM

## 2021-06-09 DIAGNOSIS — I2510 Atherosclerotic heart disease of native coronary artery without angina pectoris: Secondary | ICD-10-CM

## 2021-06-09 DIAGNOSIS — F32A Depression, unspecified: Secondary | ICD-10-CM

## 2021-06-09 DIAGNOSIS — I10 Essential (primary) hypertension: Secondary | ICD-10-CM

## 2021-06-09 DIAGNOSIS — I252 Old myocardial infarction: Secondary | ICD-10-CM

## 2021-06-09 LAB — POCT GLYCOSYLATED HEMOGLOBIN (HGB A1C): Hemoglobin A1C: 7.8 % — AB (ref 4.0–5.6)

## 2021-06-09 MED ORDER — CYCLOBENZAPRINE HCL 5 MG PO TABS: 5.0000 mg | ORAL_TABLET | Freq: Every day | ORAL | 1 refills | Status: AC

## 2021-06-09 MED ORDER — CARVEDILOL 25 MG PO TABS: ORAL_TABLET | ORAL | 2 refills | Status: DC

## 2021-06-09 MED ORDER — AMLODIPINE BESYLATE 10 MG PO TABS: 10.0000 mg | ORAL_TABLET | Freq: Every day | ORAL | 3 refills | Status: DC

## 2021-06-09 MED ORDER — BLOOD GLUCOSE MONITOR KIT
PACK | 0 refills | Status: AC
Start: 2021-06-09 — End: ?

## 2021-06-09 MED ORDER — HYDROXYZINE HCL 50 MG PO TABS: 50.0000 mg | ORAL_TABLET | Freq: Four times a day (QID) | ORAL | 3 refills | Status: AC | PRN

## 2021-06-09 MED ORDER — SERTRALINE HCL 100 MG PO TABS: 50.0000 mg | ORAL_TABLET | Freq: Every day | ORAL | 3 refills | Status: DC

## 2021-06-09 MED ORDER — ROSUVASTATIN CALCIUM 40 MG PO TABS: 40.0000 mg | ORAL_TABLET | Freq: Every day | ORAL | 3 refills | Status: DC

## 2021-06-09 MED ORDER — GLIMEPIRIDE 1 MG PO TABS: ORAL_TABLET | ORAL | 0 refills | Status: DC

## 2021-06-09 NOTE — Progress Notes (Signed)
Subjective:    Patient ID: Casey Acosta, female    DOB: [birth year], 50 y.o.   MRN: [id number]  Chief Complaint  Patient presents with   Follow-up    HPI Patient was seen today for f/u on chronic conditions.  Pt increased her zoloft from 50 mg to 100 mg daily.  Felt like it was not working as well and depression/anxiety symptoms were increasing.  Requesting refills on several meds.  Needs a new glucometer.  Has an appt with the eye [email or web address]. Had recent mammogram and foot exam.   Had pap 01/01/20?  Past Medical History:  Diagnosis Date   Anemia    Anxiety    Anxiety and depression    Chronic kidney disease    Colon polyp    Diabetes mellitus without complication ([place])    Dysuria    High risk sexual behavior    History of syncope    Hyperlipidemia    Hypertension    Internal hemorrhoids    LVH (left ventricular hypertrophy)    Malaise and fatigue    Obesity (BMI 30-39.9)    Ovarian mass    Panic disorder    PCOS (polycystic ovarian syndrome)    Rectal bleeding    Shortness of breath    Surgical menopause    Vaginal trichomoniasis     Allergies  Allergen Reactions   Dilaudid [Hydromorphone] Nausea And Vomiting   Prozac [Fluoxetine Hcl]     hallucinations    ROS General: Denies fever, chills, night sweats, changes in weight, changes in appetite HEENT: Denies headaches, ear pain, changes in vision, rhinorrhea, sore throat CV: Denies CP, palpitations, SOB, orthopnea Pulm: Denies SOB, cough, wheezing GI: Denies abdominal pain, nausea, vomiting, diarrhea, constipation GU: Denies dysuria, hematuria, frequency, vaginal discharge Msk: Denies muscle cramps, joint pains Neuro: Denies weakness, numbness, tingling Skin: Denies rashes, bruising Psych: Denies hallucinations  +anxiety/depression    Objective:    Blood pressure (!) 160/80, pulse 97, temperature 99.2 F (37.3 C), temperature source Oral, weight 201 lb (91.2 kg), SpO2 92 %.  Gen. Pleasant, well-nourished,  in no distress, normal affect   HEENT: [place]/AT, face symmetric, conjunctiva clear, no scleral icterus, PERRLA, EOMI, nares patent without drainage Lungs: no accessory muscle use, CTAB, no wheezes or rales Cardiovascular: RRR, no m/r/g, no peripheral edema Musculoskeletal: No deformities, no cyanosis or clubbing, normal tone Neuro:  A&Ox3, CN II-XII intact, normal gait Skin:  Warm, no lesions/ rash   Wt Readings from Last 3 Encounters:  06/09/21 201 lb (91.2 kg)  05/27/21 200 lb 9.6 oz (91 kg)  02/17/21 197 lb (89.4 kg)    Lab Results  Component Value Date   WBC 12.0 (H) 08/06/2020   HGB 9.3 (L) 08/06/2020   HCT 28.1 (L) 08/06/2020   PLT 308 08/06/2020   GLUCOSE 136 (H) 02/17/2021   CHOL 151 02/17/2021   TRIG 251 (H) 02/17/2021   HDL 38 (L) 02/17/2021   LDLDIRECT 206.7 (H) 08/04/2020   LDLCALC 72 02/17/2021   ALT 31 02/17/2021   AST 20 02/17/2021   NA 138 02/17/2021   K 4.2 02/17/2021   CL 103 02/17/2021   CREATININE 2.14 (H) 02/17/2021   BUN 23 02/17/2021   CO2 18 (L) 02/17/2021   TSH 1.089 05/27/2020   TSH 1.148 05/27/2020   INR 1.1 08/04/2020   HGBA1C 8.5 (A) 12/02/2020   MICROALBUR 95.1 (H) 02/23/2018    Assessment/Plan:  Type 2 diabetes mellitus with stage 4 chronic kidney disease, without long-term  current use of insulin ([place])  -continue f/u with Nephrology -avoid nephrotoxic meds and renally dose meds -Hgb A1C 7.8% this visit -will decrease glimepiride from 2 tabs (2 mg) daily to 1 tab (1 mg) daily -continue statin -foot exam up to date -eye exam scheduled - Plan: POC HgB A1c, blood glucose meter kit and supplies KIT, glimepiride (AMARYL) 1 MG tablet  Colon cancer screening  - Plan: Ambulatory referral to Gastroenterology  Acute midline low back pain with bilateral sciatica  - Plan: cyclobenzaprine (FLEXERIL) 5 MG tablet  Anxiety and depression -increased -pt advised to avoid self increases of medications -will increase dose Zoloft from 50 mg to 100  mg daily -counseling encouraged.  Given info -f/u in 4-6 wks, sooner if needed   - Plan: sertraline (ZOLOFT) 100 MG tablet, hydrOXYzine (ATARAX/VISTARIL) 50 MG tablet  Essential hypertension  -uncontrolled -discussed the importance of lifestyle modifications -continue current medications including Coreg 25 mg BID, Norvasc 10 mg daily, hydralazine 100 mg , clonidine 0.1 mg patch -refills provided - Plan: carvedilol (COREG) 25 MG tablet, amLODipine (NORVASC) 10 MG tablet  CKD (chronic kidney disease) stage 4, GFR 15-29 ml/min (HCC) -continue f/u with Nephrology -renally dose meds and avoid nephrotoxic meds  History of ST elevation myocardial infarction (STEMI) -continue statin, brilinta, bp meds -discussed the importance of bp and cholesterol control -continue lifestyle modifications  CAD S/P percutaneous coronary angioplasty  -continue Brilinta - Plan: rosuvastatin (CRESTOR) 40 MG tablet  F/u in 4-6 wks  Grier Mitts, MD

## 2021-06-11 ENCOUNTER — Ambulatory Visit (HOSPITAL_COMMUNITY): Payer: 59 | Attending: Cardiology

## 2021-06-12 ENCOUNTER — Encounter (HOSPITAL_COMMUNITY): Payer: Self-pay | Admitting: Physician Assistant

## 2021-06-13 ENCOUNTER — Other Ambulatory Visit: Payer: Self-pay | Admitting: Family Medicine

## 2021-06-13 DIAGNOSIS — F419 Anxiety disorder, unspecified: Secondary | ICD-10-CM

## 2021-06-13 DIAGNOSIS — F32A Depression, unspecified: Secondary | ICD-10-CM

## 2021-06-17 NOTE — Progress Notes (Deleted)
HPI: Follow-up coronary artery disease.  This is my first time evaluating this patient.  Renal Dopplers May 2021 showed no renal artery stenosis.  Patient was admitted September 2021 with hypertension and chest pain.  Troponin mildly elevated.  Transesophageal echocardiogram was performed as there was a possible mass on her aortic valve on transthoracic echocardiogram.  This revealed an ejection fraction of 60 to 65%, nodular thickening of the left coronary cusp of the aortic valve and trace aortic insufficiency.  Nuclear study was low risk.  She subsequently had an inferior infarct in December 2021.  Cardiac catheterization December 2021 showed 60% proximal LAD, 80% first diagonal, 50% ramus, occluded circumflex, 80% mid right coronary artery, 90% third posterior lateral.  Patient had PCI of the circumflex at that time.  Remainder of disease was treated medically.  Echocardiogram repeated December 2021 normal LV function, grade 2 diastolic dysfunction, small pericardial effusion, mild mitral regurgitation and showed a small mass on the left coronary cusp.  Seen with atypical chest pain September 2022.  Since last seen  Current Outpatient Medications  Medication Sig Dispense Refill   amLODipine (NORVASC) 10 MG tablet Take 1 tablet (10 mg total) by mouth daily. 90 tablet 3   aspirin EC 81 MG tablet Take 81 mg by mouth daily.     blood glucose meter kit and supplies KIT Dispense based on patient and insurance preference. Use up to four times daily as directed. 1 each 0   carvedilol (COREG) 25 MG tablet TAKE ONE TABLET BY MOUTH TWICE A DAY WITH A MEAL 180 tablet 2   cloNIDine (CATAPRES - DOSED IN MG/24 HR) 0.1 mg/24hr patch Place 1 patch (0.1 mg total) onto the skin once a week. 4 patch 12   cyclobenzaprine (FLEXERIL) 5 MG tablet Take 1 tablet (5 mg total) by mouth at bedtime. 30 tablet 1   glimepiride (AMARYL) 1 MG tablet TAKE ONE TABLET BY MOUTH DAILY WITH BREAKFAST 30 tablet 0   glucose blood  test strip One-step Ultra meter.  Use as instructed 100 each 12   hydrALAZINE (APRESOLINE) 100 MG tablet Take 1 tablet (100 mg total) by mouth 4 (four) times daily. 360 tablet 3   hydrOXYzine (ATARAX/VISTARIL) 50 MG tablet Take 1 tablet (50 mg total) by mouth every 6 (six) hours as needed for anxiety. 60 tablet 3   nitroGLYCERIN (NITROSTAT) 0.4 MG SL tablet Place 1 tablet (0.4 mg total) under the tongue every 5 (five) minutes as needed. 25 tablet 2   OZEMPIC, 0.25 OR 0.5 MG/DOSE, 2 MG/1.5ML SOPN DIAL AND INJECT UNDER THE SKIN 0.5 MG WEEKLY 1.5 mL 2   rosuvastatin (CRESTOR) 40 MG tablet Take 1 tablet (40 mg total) by mouth daily. 90 tablet 3   sertraline (ZOLOFT) 100 MG tablet Take 0.5 tablets (50 mg total) by mouth daily. 30 tablet 3   ticagrelor (BRILINTA) 90 MG TABS tablet TAKE ONE TABLET BY MOUTH TWICE A DAY 60 tablet 6   No current facility-administered medications for this visit.     Past Medical History:  Diagnosis Date   Anemia    Anxiety    Anxiety and depression    Chronic kidney disease    Colon polyp    Diabetes mellitus without complication ([place])    Dysuria    High risk sexual behavior    History of syncope    Hyperlipidemia    Hypertension    Internal hemorrhoids    LVH (left ventricular hypertrophy)  Malaise and fatigue    Obesity (BMI 30-39.9)    Ovarian mass    Panic disorder    PCOS (polycystic ovarian syndrome)    Rectal bleeding    Shortness of breath    Surgical menopause    Vaginal trichomoniasis     Past Surgical History:  Procedure Laterality Date   ABDOMINAL HYSTERECTOMY     partial   APPENDECTOMY     CHOLECYSTECTOMY     CORONARY/GRAFT ACUTE MI REVASCULARIZATION N/A 08/04/2020   Procedure: Coronary/Graft Acute MI Revascularization;  Surgeon: Martinique, Peter M, MD;  Location: [facility];  Service: Cardiovascular;  Laterality: N/A;   LEFT HEART CATH AND CORONARY ANGIOGRAPHY N/A 08/04/2020   Procedure: LEFT HEART CATH AND CORONARY  ANGIOGRAPHY;  Surgeon: Martinique, Peter M, MD;  Location: [place] [facility];  Service: Cardiovascular;  Laterality: N/A;   TEE WITHOUT CARDIOVERSION N/A 05/27/2020   Procedure: TRANSESOPHAGEAL ECHOCARDIOGRAM (TEE);  Surgeon: Josue Hector, MD;  [facility]: [facility] ENDOSCOPY;  Service: Cardiovascular;  Laterality: N/A;   TUBAL LIGATION      Social History   Socioeconomic History   Marital status: Married    Spouse name: Not on file   Number of children: Not on file   Years of education: Not on file   Highest education level: Not on file  Occupational History   Not on file  Tobacco Use   Smoking status: Never   Smokeless tobacco: Never  Vaping Use   Vaping Use: Never used  Substance and Sexual Activity   Alcohol use: Yes    Comment: Occas. wine   Drug use: No   Sexual activity: Yes    Birth control/protection: Surgical    Comment: HYST.-1st intercourse 50 yo-More than 5 partners  Other Topics Concern   Not on file  Social History Narrative   Not on file   Social Determinants of Health   Financial Resource Strain: Not on file  Food Insecurity: Not on file  Transportation Needs: Not on file  Physical Activity: Not on file  Stress: Not on file  Social Connections: Not on file  Intimate Partner Violence: Not on file    Family History  Problem Relation Age of Onset   Breast cancer Mother    Anxiety disorder Mother    Hyperlipidemia Mother    Depression Mother    Hypertension Mother    Diabetes Mother    Thyroid disease Mother    ALS Father    Hypertension Sister    Diabetes Sister    Crohn's disease Sister    Breast cancer Maternal Aunt    Breast cancer Maternal Aunt    Crohn's disease Brother     ROS: no fevers or chills, productive cough, hemoptysis, dysphasia, odynophagia, melena, hematochezia, dysuria, hematuria, rash, seizure activity, orthopnea, PND, pedal edema, claudication. Remaining systems are negative.  Physical Exam: Well-developed well-nourished in no  acute distress.  Skin is warm and dry.  HEENT is normal.  Neck is supple.  Chest is clear to auscultation with normal expansion.  Cardiovascular exam is regular rate and rhythm.  Abdominal exam nontender or distended. No masses palpated. Extremities show no edema. neuro grossly intact  ECG- personally reviewed  A/P  1 coronary artery disease-continue aspirin, Brilinta and statin.  We will discontinue Brilinta December 2022.  2 hypertension-patient's blood pressure is controlled.  Continue present medications.  3 hyperlipidemia-continue statin.  4 question aortic valve mass-previous transesophageal echocardiogram showed nodular thickening felt not to be clinically significant.  5 chronic stage IV kidney disease  Kirk Ruths, MD

## 2021-06-19 ENCOUNTER — Telehealth (HOSPITAL_COMMUNITY): Payer: Self-pay | Admitting: Physician Assistant

## 2021-06-19 NOTE — Telephone Encounter (Signed)
Routed to primary as [place]

## 2021-06-19 NOTE — Telephone Encounter (Signed)
Just an FYI. We have made several attempts to contact this patient including sending a letter to schedule or reschedule their echocardiogram. We will be removing the patient from the echo WQ.  06/11/21 NO SHOWED-MAILED LETTER [facility]      Thank you

## 2021-06-24 ENCOUNTER — Ambulatory Visit: Payer: BC Managed Care – PPO | Admitting: Cardiology

## 2021-07-05 ENCOUNTER — Other Ambulatory Visit: Payer: Self-pay | Admitting: Family Medicine

## 2021-07-05 DIAGNOSIS — N184 Chronic kidney disease, stage 4 (severe): Secondary | ICD-10-CM

## 2021-07-05 DIAGNOSIS — E1122 Type 2 diabetes mellitus with diabetic chronic kidney disease: Secondary | ICD-10-CM

## 2021-07-21 ENCOUNTER — Other Ambulatory Visit: Payer: Self-pay | Admitting: Family Medicine

## 2021-07-21 DIAGNOSIS — E1122 Type 2 diabetes mellitus with diabetic chronic kidney disease: Secondary | ICD-10-CM

## 2021-07-25 ENCOUNTER — Other Ambulatory Visit: Payer: Self-pay | Admitting: Family Medicine

## 2021-07-25 DIAGNOSIS — I2510 Atherosclerotic heart disease of native coronary artery without angina pectoris: Secondary | ICD-10-CM

## 2021-07-25 DIAGNOSIS — Z9861 Coronary angioplasty status: Secondary | ICD-10-CM

## 2021-08-21 ENCOUNTER — Telehealth: Payer: Self-pay | Admitting: Physician Assistant

## 2021-08-21 NOTE — Telephone Encounter (Signed)
Will route to scheduling to get ECHO reordered-order is still active.   Will notify sleep coordinator to review if another sleep study is needed.   Thanks!

## 2021-08-21 NOTE — Telephone Encounter (Signed)
Casey Acosta is calling requesting the orders for her echo and sleep study she canceled be resubmitted to schedule.

## 2021-09-02 NOTE — Telephone Encounter (Signed)
Called patient, LVM advising I sent a message to our sleep coordinator and scheduling. I advised I would check to see if we need new orders.   Thank you!

## 2021-09-02 NOTE — Telephone Encounter (Signed)
Pt is calling to f.u

## 2021-09-05 ENCOUNTER — Other Ambulatory Visit: Payer: Self-pay | Admitting: Family Medicine

## 2021-09-05 MED ORDER — HYDRALAZINE HCL 100 MG PO TABS: 100.0000 mg | ORAL_TABLET | Freq: Four times a day (QID) | ORAL | 0 refills | Status: DC

## 2021-09-05 NOTE — Addendum Note (Signed)
Addended by: Grier Mitts R on: 09/05/2021 04:11 PM   Modules accepted: Orders

## 2021-09-10 ENCOUNTER — Ambulatory Visit: Payer: 59 | Admitting: Family Medicine

## 2021-09-11 ENCOUNTER — Telehealth: Payer: Self-pay | Admitting: *Deleted

## 2021-09-11 NOTE — Telephone Encounter (Signed)
Mychart message sent to patient in reference to rescheduling her sleep study.

## 2021-09-16 NOTE — Progress Notes (Signed)
HPI: Follow-up coronary artery disease.  Transesophageal echocardiogram September 2021 showed normal LV function, nodular thickening of the aortic valve.  Patient suffered an inferolateral myocardial infarction December 2021.  Cardiac catheterization revealed 60% proximal LAD, 50% mid LAD, 80% first diagonal, 50% ramus, occluded circumflex, 80% proximal to mid right coronary artery, 80% LPAV, 90% third posterior lateral.  Patient had PCI of left circumflex at that time.  Echocardiogram December 2021 showed small mass attached to the left coronary cusp of the aortic valve, normal LV function, severe left ventricular hypertrophy, grade 2 diastolic dysfunction, small pericardial effusion, mild mitral regurgitation.  Also has stage IV kidney disease.  Last seen in September 2022 with complaints of chest pain.  Echocardiogram was recommended but not performed.  Since last seen she has some dyspnea on exertion but no orthopnea or PND.  Over the past 2 months she has noticed some edema in her lower extremities bilaterally at the end of the day that improves overnight.  She denies chest pain or syncope.  Current Outpatient Medications  Medication Sig Dispense Refill   amLODipine (NORVASC) 10 MG tablet Take 1 tablet (10 mg total) by mouth daily. 90 tablet 3   aspirin EC 81 MG tablet Take 81 mg by mouth daily.     atorvastatin (LIPITOR) 80 MG tablet Take 1 tablet (80 mg total) by mouth daily. 30 tablet 1   blood glucose meter kit and supplies KIT Dispense based on patient and insurance preference. Use up to four times daily as directed. 1 each 0   carvedilol (COREG) 25 MG tablet TAKE ONE TABLET BY MOUTH TWICE A DAY WITH A MEAL 180 tablet 2   cloNIDine (CATAPRES - DOSED IN MG/24 HR) 0.1 mg/24hr patch Place 1 patch (0.1 mg total) onto the skin once a week. 4 patch 12   cyclobenzaprine (FLEXERIL) 5 MG tablet Take 1 tablet (5 mg total) by mouth at bedtime. (Patient taking differently: Take 5 mg by mouth as  needed.) 30 tablet 1   glimepiride (AMARYL) 1 MG tablet TAKE ONE TABLET BY MOUTH DAILY WITH BREAKFAST. 90 tablet 1   glucose blood test strip One-step Ultra meter.  Use as instructed 100 each 12   hydrALAZINE (APRESOLINE) 100 MG tablet Take 1 tablet (100 mg total) by mouth 4 (four) times daily. (Patient taking differently: Take 100 mg by mouth 3 (three) times daily.) 360 tablet 0   hydrOXYzine (ATARAX/VISTARIL) 50 MG tablet Take 1 tablet (50 mg total) by mouth every 6 (six) hours as needed for anxiety. 60 tablet 3   Multiple Vitamin (MULTIVITAMIN PO) Take by mouth.     nitroGLYCERIN (NITROSTAT) 0.4 MG SL tablet Place 1 tablet (0.4 mg total) under the tongue every 5 (five) minutes as needed. 25 tablet 2   OZEMPIC, 0.25 OR 0.5 MG/DOSE, 2 MG/1.5ML SOPN DIAL AND INJECT UNDER THE SKIN 0.5 MG WEEKLY 1.5 mL 2   sertraline (ZOLOFT) 100 MG tablet Take 0.5 tablets (50 mg total) by mouth daily. (Patient taking differently: Take 100 mg by mouth daily.) 30 tablet 3   ticagrelor (BRILINTA) 90 MG TABS tablet TAKE ONE TABLET BY MOUTH TWICE A DAY 60 tablet 6   No current facility-administered medications for this visit.     Past Medical History:  Diagnosis Date   Anemia    Anxiety    Anxiety and depression    Chronic kidney disease    Colon polyp    Diabetes mellitus without complication ([place])  Dysuria    High risk sexual behavior    History of syncope    Hyperlipidemia    Hypertension    Internal hemorrhoids    LVH (left ventricular hypertrophy)    Malaise and fatigue    Obesity (BMI 30-39.9)    Ovarian mass    Panic disorder    PCOS (polycystic ovarian syndrome)    Rectal bleeding    Shortness of breath    Surgical menopause    Vaginal trichomoniasis     Past Surgical History:  Procedure Laterality Date   ABDOMINAL HYSTERECTOMY     partial   APPENDECTOMY     CHOLECYSTECTOMY     CORONARY/GRAFT ACUTE MI REVASCULARIZATION N/A 08/04/2020   Procedure: Coronary/Graft Acute MI  Revascularization;  Surgeon: Martinique, Peter M, MD;  Location: [facility];  Service: Cardiovascular;  Laterality: N/A;   LEFT HEART CATH AND CORONARY ANGIOGRAPHY N/A 08/04/2020   Procedure: LEFT HEART CATH AND CORONARY ANGIOGRAPHY;  Surgeon: Martinique, Peter M, MD;  Location: [place] [facility];  Service: Cardiovascular;  Laterality: N/A;   TEE WITHOUT CARDIOVERSION N/A 05/27/2020   Procedure: TRANSESOPHAGEAL ECHOCARDIOGRAM (TEE);  Surgeon: Josue Hector, MD;  Location: [facility] ENDOSCOPY;  Service: Cardiovascular;  Laterality: N/A;   TUBAL LIGATION      Social History   Socioeconomic History   Marital status: Married    Spouse name: Not on file   Number of children: Not on file   Years of education: Not on file   Highest education level: Not on file  Occupational History   Not on file  Tobacco Use   Smoking status: Never   Smokeless tobacco: Never  Vaping Use   Vaping Use: Never used  Substance and Sexual Activity   Alcohol use: Yes    Comment: Occas. wine   Drug use: No   Sexual activity: Yes    Birth control/protection: Surgical    Comment: HYST.-1st intercourse 51 yo-More than 5 partners  Other Topics Concern   Not on file  Social History Narrative   Not on file   Social Determinants of Health   Financial Resource Strain: Not on file  Food Insecurity: Not on file  Transportation Needs: Not on file  Physical Activity: Not on file  Stress: Not on file  Social Connections: Not on file  Intimate Partner Violence: Not on file    Family History  Problem Relation Age of Onset   Breast cancer Mother    Anxiety disorder Mother    Hyperlipidemia Mother    Depression Mother    Hypertension Mother    Diabetes Mother    Thyroid disease Mother    ALS Father    Hypertension Sister    Diabetes Sister    Crohn's disease Sister    Breast cancer Maternal Aunt    Breast cancer Maternal Aunt    Crohn's disease Brother     ROS: no fevers or chills, productive cough,  hemoptysis, dysphasia, odynophagia, melena, hematochezia, dysuria, hematuria, rash, seizure activity, orthopnea, PND,  claudication. Remaining systems are negative.  Physical Exam: Well-developed well-nourished in no acute distress.  Skin is warm and dry.  HEENT is normal.  Neck is supple.  Chest is clear to auscultation with normal expansion.  Cardiovascular exam is regular rate and rhythm.  Abdominal exam nontender or distended. No masses palpated. Extremities show trace edema. neuro grossly intact  A/P  1 coronary artery disease-patient has not had recurrent chest pain since September.  Lan to  continue medical therapy with aspirin and statin.  We will discontinue Brilinta.  2 hypertension-patient's blood pressure is elevated; increase clonidine patch to 0.2 mg per 24 hours.  Follow and adjust as needed.  3 hyperlipidemia-continue Crestor. Check lipids and liver.   4 question aortic valve mass-previous TEE showed nodular thickening.  5 chronic stage IV kidney disease-follow-up nephrology.  We will check potassium and renal function.  6 lower extremity edema-recent onset.  Possible venous insufficiency.  We will check echocardiogram for LV function.  Can consider low dose diuretic in the future if needed but would need to be careful given chronic renal insufficiency.  I do not think this is related to amlodipine as she has been on that medication for 3 years and her edema began recently.  Kirk Ruths, MD

## 2021-09-17 ENCOUNTER — Other Ambulatory Visit: Payer: Self-pay

## 2021-09-17 ENCOUNTER — Ambulatory Visit: Payer: 59 | Admitting: Cardiology

## 2021-09-17 ENCOUNTER — Encounter: Payer: Self-pay | Admitting: Cardiology

## 2021-09-17 VITALS — BP 170/86 | HR 93 | Ht 66.0 in | Wt 213.0 lb

## 2021-09-17 DIAGNOSIS — I25119 Atherosclerotic heart disease of native coronary artery with unspecified angina pectoris: Secondary | ICD-10-CM | POA: Diagnosis not present

## 2021-09-17 DIAGNOSIS — E785 Hyperlipidemia, unspecified: Secondary | ICD-10-CM

## 2021-09-17 DIAGNOSIS — I10 Essential (primary) hypertension: Secondary | ICD-10-CM

## 2021-09-17 DIAGNOSIS — N184 Chronic kidney disease, stage 4 (severe): Secondary | ICD-10-CM

## 2021-09-17 MED ORDER — CLONIDINE 0.2 MG/24HR TD PTWK: 0.2000 mg | MEDICATED_PATCH | TRANSDERMAL | 3 refills | Status: DC

## 2021-09-17 NOTE — Patient Instructions (Signed)
Medication Instructions:   INCREASE CLONIDINE PATCH TO 0.2/24 HR ONCE WEEKLY  STOP BRILINTA  *If you need a refill on your cardiac medications before your next appointment, please call your pharmacy*   Lab Work:  Your physician recommends that you return for lab work FASTING  If you have labs (blood work) drawn today and your tests are completely normal, you will receive your results only by: [place] (if you have MyChart) OR A paper copy in the mail If you have any lab test that is abnormal or we need to change your treatment, we will call you to review the results.   Testing/Procedures:  Your physician has requested that you have an echocardiogram. Echocardiography is a painless test that uses sound waves to create images of your heart. It provides your doctor with information about the size and shape of your heart and how well your hearts chambers and valves are working. This procedure takes approximately one hour. There are no restrictions for this procedure. [place]   Follow-Up: At [facility], you and your health needs are our priority.  As part of our continuing mission to provide you with exceptional heart care, we have created designated Provider Care Teams.  These Care Teams include your primary Cardiologist (physician) and Advanced Practice Providers (APPs -  Physician Assistants and Nurse Practitioners) who all work together to provide you with the care you need, when you need it.  We recommend signing up for the patient portal called "MyChart".  Sign up information is provided on this After Visit Summary.  MyChart is used to connect with patients for Virtual Visits (Telemedicine).  Patients are able to view lab/test results, encounter notes, upcoming appointments, etc.  Non-urgent messages can be sent to your provider as well.   To learn more about what you can do with MyChart, go to [email or web address].    Your next appointment:   6  month(s)  The format for your next appointment:   In Person  Provider:   Kirk Ruths, MD

## 2021-09-23 ENCOUNTER — Other Ambulatory Visit: Payer: Self-pay

## 2021-09-23 ENCOUNTER — Ambulatory Visit (HOSPITAL_COMMUNITY): Payer: 59 | Attending: Cardiology

## 2021-09-23 DIAGNOSIS — I25119 Atherosclerotic heart disease of native coronary artery with unspecified angina pectoris: Secondary | ICD-10-CM | POA: Diagnosis present

## 2021-09-23 LAB — COMPREHENSIVE METABOLIC PANEL
ALT: 22 IU/L (ref 0–32)
AST: 21 IU/L (ref 0–40)
Albumin/Globulin Ratio: 1.2 (ref 1.2–2.2)
Albumin: 4.3 g/dL (ref 3.8–4.9)
Alkaline Phosphatase: 51 IU/L (ref 44–121)
BUN/Creatinine Ratio: 10 (ref 9–23)
BUN: 20 mg/dL (ref 6–24)
Bilirubin Total: 0.4 mg/dL (ref 0.0–1.2)
CO2: 23 mmol/L (ref 20–29)
Calcium: 9.5 mg/dL (ref 8.7–10.2)
Chloride: 102 mmol/L (ref 96–106)
Creatinine, Ser: 1.94 mg/dL — ABNORMAL HIGH (ref 0.57–1.00)
Globulin, Total: 3.7 g/dL (ref 1.5–4.5)
Glucose: 172 mg/dL — ABNORMAL HIGH (ref 70–99)
Potassium: 4.2 mmol/L (ref 3.5–5.2)
Sodium: 137 mmol/L (ref 134–144)
Total Protein: 8 g/dL (ref 6.0–8.5)
eGFR: 31 mL/min/{1.73_m2} — ABNORMAL LOW (ref >59)

## 2021-09-23 LAB — ECHOCARDIOGRAM COMPLETE
Area-P 1/2: 4.06 cm2
S' Lateral: 4.05 cm

## 2021-09-23 LAB — LIPID PANEL
Chol/HDL Ratio: 4.7 ratio — ABNORMAL HIGH (ref 0.0–4.4)
Cholesterol, Total: 183 mg/dL (ref 100–199)
HDL: 39 mg/dL — ABNORMAL LOW (ref >39)
LDL Chol Calc (NIH): 101 mg/dL — ABNORMAL HIGH (ref 0–99)
Triglycerides: 255 mg/dL — ABNORMAL HIGH (ref 0–149)
VLDL Cholesterol Cal: 43 mg/dL — ABNORMAL HIGH (ref 5–40)

## 2021-09-24 ENCOUNTER — Ambulatory Visit: Payer: 59 | Admitting: Family Medicine

## 2021-09-24 ENCOUNTER — Encounter: Payer: Self-pay | Admitting: Cardiology

## 2021-09-24 ENCOUNTER — Other Ambulatory Visit: Payer: Self-pay | Admitting: *Deleted

## 2021-09-24 DIAGNOSIS — E785 Hyperlipidemia, unspecified: Secondary | ICD-10-CM

## 2021-09-24 MED ORDER — EZETIMIBE 10 MG PO TABS: 10.0000 mg | ORAL_TABLET | Freq: Every day | ORAL | 3 refills | Status: DC

## 2021-09-26 ENCOUNTER — Telehealth: Payer: Self-pay | Admitting: *Deleted

## 2021-09-26 DIAGNOSIS — I2510 Atherosclerotic heart disease of native coronary artery without angina pectoris: Secondary | ICD-10-CM

## 2021-09-26 DIAGNOSIS — Z9861 Coronary angioplasty status: Secondary | ICD-10-CM

## 2021-09-26 MED ORDER — ATORVASTATIN CALCIUM 80 MG PO TABS: 80.0000 mg | ORAL_TABLET | Freq: Every day | ORAL | 3 refills | Status: DC

## 2021-09-26 NOTE — Telephone Encounter (Signed)
-----   Message from Lelon Perla, MD sent at 09/24/2021  7:35 AM EST ----- Add zetia 10 mg daily; lipids and liver 8 weeks Kirk Ruths

## 2021-09-26 NOTE — Telephone Encounter (Signed)
Spoke with pt, she reports she is only taking 40 mg of atorvastatin. Patient aware NOT to pick up the ezetimibe that has already been sent to the pharmacy,Patient voiced understanding. Lab orders mailed to the pt

## 2021-09-28 ENCOUNTER — Other Ambulatory Visit: Payer: Self-pay | Admitting: Family Medicine

## 2021-09-28 DIAGNOSIS — F419 Anxiety disorder, unspecified: Secondary | ICD-10-CM

## 2021-10-06 ENCOUNTER — Other Ambulatory Visit: Payer: Self-pay | Admitting: Family Medicine

## 2021-10-07 ENCOUNTER — Other Ambulatory Visit (HOSPITAL_COMMUNITY): Payer: Self-pay

## 2021-10-07 MED ORDER — HYDRALAZINE HCL 100 MG PO TABS
100.0000 mg | ORAL_TABLET | Freq: Four times a day (QID) | ORAL | 1 refills | Status: DC
  Filled 2021-10-07: qty 360, 90d supply, fill #0

## 2021-10-08 ENCOUNTER — Other Ambulatory Visit (HOSPITAL_COMMUNITY): Payer: Self-pay

## 2021-10-09 ENCOUNTER — Other Ambulatory Visit (HOSPITAL_COMMUNITY): Payer: Self-pay

## 2021-10-14 ENCOUNTER — Ambulatory Visit: Payer: 59 | Admitting: Physician Assistant

## 2021-10-17 ENCOUNTER — Other Ambulatory Visit (HOSPITAL_COMMUNITY): Payer: Self-pay

## 2021-10-18 ENCOUNTER — Encounter (HOSPITAL_COMMUNITY): Payer: Self-pay | Admitting: Emergency Medicine

## 2021-10-18 ENCOUNTER — Other Ambulatory Visit: Payer: Self-pay

## 2021-10-18 ENCOUNTER — Observation Stay (HOSPITAL_COMMUNITY)
Admission: EM | Admit: 2021-10-18 | Discharge: 2021-10-19 | Disposition: A | Discharge status: Home / Self Care | Payer: Self-pay | Attending: Internal Medicine | Admitting: Internal Medicine

## 2021-10-18 ENCOUNTER — Emergency Department (HOSPITAL_COMMUNITY): Payer: Self-pay

## 2021-10-18 DIAGNOSIS — Z20822 Contact with and (suspected) exposure to covid-19: Secondary | ICD-10-CM | POA: Insufficient documentation

## 2021-10-18 DIAGNOSIS — Z23 Encounter for immunization: Secondary | ICD-10-CM | POA: Insufficient documentation

## 2021-10-18 DIAGNOSIS — Z79899 Other long term (current) drug therapy: Secondary | ICD-10-CM | POA: Insufficient documentation

## 2021-10-18 DIAGNOSIS — I2510 Atherosclerotic heart disease of native coronary artery without angina pectoris: Secondary | ICD-10-CM | POA: Insufficient documentation

## 2021-10-18 DIAGNOSIS — Z7982 Long term (current) use of aspirin: Secondary | ICD-10-CM | POA: Insufficient documentation

## 2021-10-18 DIAGNOSIS — E785 Hyperlipidemia, unspecified: Secondary | ICD-10-CM | POA: Insufficient documentation

## 2021-10-18 DIAGNOSIS — E1165 Type 2 diabetes mellitus with hyperglycemia: Secondary | ICD-10-CM | POA: Insufficient documentation

## 2021-10-18 DIAGNOSIS — I130 Hypertensive heart and chronic kidney disease with heart failure and stage 1 through stage 4 chronic kidney disease, or unspecified chronic kidney disease: Secondary | ICD-10-CM | POA: Insufficient documentation

## 2021-10-18 DIAGNOSIS — F419 Anxiety disorder, unspecified: Secondary | ICD-10-CM | POA: Diagnosis present

## 2021-10-18 DIAGNOSIS — I10 Essential (primary) hypertension: Secondary | ICD-10-CM | POA: Diagnosis present

## 2021-10-18 DIAGNOSIS — I200 Unstable angina: Secondary | ICD-10-CM

## 2021-10-18 DIAGNOSIS — R079 Chest pain, unspecified: Secondary | ICD-10-CM

## 2021-10-18 DIAGNOSIS — Z955 Presence of coronary angioplasty implant and graft: Secondary | ICD-10-CM | POA: Insufficient documentation

## 2021-10-18 DIAGNOSIS — E669 Obesity, unspecified: Secondary | ICD-10-CM | POA: Diagnosis present

## 2021-10-18 DIAGNOSIS — R0789 Other chest pain: Principal | ICD-10-CM | POA: Insufficient documentation

## 2021-10-18 DIAGNOSIS — Z7984 Long term (current) use of oral hypoglycemic drugs: Secondary | ICD-10-CM | POA: Insufficient documentation

## 2021-10-18 DIAGNOSIS — E1169 Type 2 diabetes mellitus with other specified complication: Secondary | ICD-10-CM | POA: Insufficient documentation

## 2021-10-18 DIAGNOSIS — N184 Chronic kidney disease, stage 4 (severe): Secondary | ICD-10-CM | POA: Insufficient documentation

## 2021-10-18 DIAGNOSIS — I5032 Chronic diastolic (congestive) heart failure: Secondary | ICD-10-CM | POA: Insufficient documentation

## 2021-10-18 LAB — LIPID PANEL
Cholesterol: 192 mg/dL (ref 0–200)
HDL: 37 mg/dL — ABNORMAL LOW (ref >40)
LDL Cholesterol: 109 mg/dL — ABNORMAL HIGH (ref 0–99)
Total CHOL/HDL Ratio: 5.2 RATIO
Triglycerides: 232 mg/dL — ABNORMAL HIGH (ref <150)
VLDL: 46 mg/dL — ABNORMAL HIGH (ref 0–40)

## 2021-10-18 LAB — BASIC METABOLIC PANEL
Anion gap: 12 (ref 5–15)
BUN: 20 mg/dL (ref 6–20)
CO2: 21 mmol/L — ABNORMAL LOW (ref 22–32)
Calcium: 9.4 mg/dL (ref 8.9–10.3)
Chloride: 104 mmol/L (ref 98–111)
Creatinine, Ser: 2.24 mg/dL — ABNORMAL HIGH (ref 0.44–1.00)
GFR, Estimated: 26 mL/min — ABNORMAL LOW (ref >60)
Glucose, Bld: 240 mg/dL — ABNORMAL HIGH (ref 70–99)
Potassium: 3.4 mmol/L — ABNORMAL LOW (ref 3.5–5.1)
Sodium: 137 mmol/L (ref 135–145)

## 2021-10-18 LAB — TROPONIN I (HIGH SENSITIVITY)
Troponin I (High Sensitivity): 23 ng/L — ABNORMAL HIGH (ref <18)
Troponin I (High Sensitivity): 21 ng/L — ABNORMAL HIGH (ref <18)
Troponin I (High Sensitivity): 17 ng/L (ref <18)

## 2021-10-18 LAB — HEMOGLOBIN A1C
Hgb A1c MFr Bld: 7.9 % — ABNORMAL HIGH (ref 4.8–5.6)
Mean Plasma Glucose: 180.03 mg/dL

## 2021-10-18 LAB — RESP PANEL BY RT-PCR (FLU A&B, COVID) ARPGX2
Influenza A by PCR: NEGATIVE
Influenza B by PCR: NEGATIVE
SARS Coronavirus 2 by RT PCR: NEGATIVE

## 2021-10-18 LAB — GLUCOSE, CAPILLARY
Glucose-Capillary: 156 mg/dL — ABNORMAL HIGH (ref 70–99)
Glucose-Capillary: 207 mg/dL — ABNORMAL HIGH (ref 70–99)

## 2021-10-18 LAB — I-STAT BETA HCG BLOOD, ED (MC, WL, AP ONLY): I-stat hCG, quantitative: <5 m[IU]/mL (ref <5)

## 2021-10-18 LAB — RAPID URINE DRUG SCREEN, HOSP PERFORMED
Amphetamines: NOT DETECTED
Barbiturates: NOT DETECTED
Benzodiazepines: NOT DETECTED
Cocaine: NOT DETECTED
Opiates: NOT DETECTED
Tetrahydrocannabinol: POSITIVE — AB

## 2021-10-18 LAB — CBC
HCT: 33.8 % — ABNORMAL LOW (ref 36.0–46.0)
Hemoglobin: 10.9 g/dL — ABNORMAL LOW (ref 12.0–15.0)
MCH: 26.5 pg (ref 26.0–34.0)
MCHC: 32.2 g/dL (ref 30.0–36.0)
MCV: 82 fL (ref 80.0–100.0)
Platelets: 359 10*3/uL (ref 150–400)
RBC: 4.12 MIL/uL (ref 3.87–5.11)
RDW: 15.4 % (ref 11.5–15.5)
WBC: 11.8 10*3/uL — ABNORMAL HIGH (ref 4.0–10.5)
nRBC: 0 % (ref 0.0–0.2)

## 2021-10-18 LAB — CBG MONITORING, ED: Glucose-Capillary: 178 mg/dL — ABNORMAL HIGH (ref 70–99)

## 2021-10-18 LAB — HIV ANTIBODY (ROUTINE TESTING W REFLEX): HIV Screen 4th Generation wRfx: NONREACTIVE

## 2021-10-18 LAB — TSH: TSH: 1.426 u[IU]/mL (ref 0.350–4.500)

## 2021-10-18 MED ORDER — INSULIN ASPART 100 UNIT/ML IJ SOLN: 0.0000 [IU] | Freq: Every day | INTRAMUSCULAR | Status: DC

## 2021-10-18 MED ORDER — NITROGLYCERIN 0.4 MG SL SUBL: 0.4000 mg | SUBLINGUAL_TABLET | SUBLINGUAL | Status: DC | PRN

## 2021-10-18 MED ORDER — CYCLOBENZAPRINE HCL 10 MG PO TABS: 5.0000 mg | ORAL_TABLET | Freq: Three times a day (TID) | ORAL | Status: DC | PRN

## 2021-10-18 MED ORDER — PNEUMOCOCCAL VAC POLYVALENT 25 MCG/0.5ML IJ INJ: 0.5000 mL | INJECTION | INTRAMUSCULAR | Status: DC

## 2021-10-18 MED ORDER — INFLUENZA VAC SPLIT QUAD 0.5 ML IM SUSY
0.5000 mL | PREFILLED_SYRINGE | INTRAMUSCULAR | Status: AC
  Administered 2021-10-19: 0.5 mL via INTRAMUSCULAR
  Filled 2021-10-18: qty 0.5

## 2021-10-18 MED ORDER — COVID-19MRNA BIVAL VACC PFIZER 30 MCG/0.3ML IM SUSP
0.3000 mL | Freq: Once | INTRAMUSCULAR | Status: AC
  Administered 2021-10-19: 0.3 mL via INTRAMUSCULAR
  Filled 2021-10-18 (×2): qty 0.3

## 2021-10-18 MED ORDER — CARVEDILOL 25 MG PO TABS
25.0000 mg | ORAL_TABLET | Freq: Two times a day (BID) | ORAL | Status: DC
  Administered 2021-10-18 – 2021-10-19 (×3): 25 mg via ORAL
  Filled 2021-10-18 (×2): qty 1
  Filled 2021-10-18: qty 8

## 2021-10-18 MED ORDER — SERTRALINE HCL 100 MG PO TABS
100.0000 mg | ORAL_TABLET | Freq: Every day | ORAL | Status: DC
  Administered 2021-10-18: 100 mg via ORAL
  Filled 2021-10-18: qty 1

## 2021-10-18 MED ORDER — SODIUM CHLORIDE 0.9% FLUSH: 3.0000 mL | INTRAVENOUS | Status: DC | PRN

## 2021-10-18 MED ORDER — SODIUM CHLORIDE 0.9 % IV SOLN: 250.0000 mL | INTRAVENOUS | Status: DC | PRN

## 2021-10-18 MED ORDER — HYDRALAZINE HCL 50 MG PO TABS
100.0000 mg | ORAL_TABLET | Freq: Three times a day (TID) | ORAL | Status: DC
  Administered 2021-10-18 – 2021-10-19 (×4): 100 mg via ORAL
  Filled 2021-10-18 (×4): qty 2

## 2021-10-18 MED ORDER — ACETAMINOPHEN 325 MG PO TABS
650.0000 mg | ORAL_TABLET | ORAL | Status: DC | PRN
  Administered 2021-10-19: 650 mg via ORAL
  Filled 2021-10-18: qty 2

## 2021-10-18 MED ORDER — ATORVASTATIN CALCIUM 80 MG PO TABS
80.0000 mg | ORAL_TABLET | Freq: Every day | ORAL | Status: DC
  Administered 2021-10-18: 80 mg via ORAL
  Filled 2021-10-18: qty 1

## 2021-10-18 MED ORDER — HYDROXYZINE HCL 25 MG PO TABS: 50.0000 mg | ORAL_TABLET | Freq: Four times a day (QID) | ORAL | Status: DC | PRN

## 2021-10-18 MED ORDER — SODIUM CHLORIDE 0.9% FLUSH
3.0000 mL | Freq: Two times a day (BID) | INTRAVENOUS | Status: DC
  Administered 2021-10-18 – 2021-10-19 (×3): 3 mL via INTRAVENOUS

## 2021-10-18 MED ORDER — PNEUMOCOCCAL 20-VAL CONJ VACC 0.5 ML IM SUSY
0.5000 mL | PREFILLED_SYRINGE | INTRAMUSCULAR | Status: AC
  Administered 2021-10-19: 0.5 mL via INTRAMUSCULAR
  Filled 2021-10-18 (×3): qty 0.5

## 2021-10-18 MED ORDER — CLONIDINE HCL 0.2 MG/24HR TD PTWK
0.2000 mg | MEDICATED_PATCH | TRANSDERMAL | Status: DC
  Administered 2021-10-18: 0.2 mg via TRANSDERMAL
  Filled 2021-10-18: qty 1

## 2021-10-18 MED ORDER — INSULIN ASPART 100 UNIT/ML IJ SOLN
0.0000 [IU] | Freq: Three times a day (TID) | INTRAMUSCULAR | Status: DC
  Administered 2021-10-18: 3 [IU] via SUBCUTANEOUS
  Administered 2021-10-18 – 2021-10-19 (×3): 5 [IU] via SUBCUTANEOUS

## 2021-10-18 MED ORDER — ASPIRIN EC 81 MG PO TBEC
81.0000 mg | DELAYED_RELEASE_TABLET | Freq: Every day | ORAL | Status: DC
  Administered 2021-10-18 – 2021-10-19 (×2): 81 mg via ORAL
  Filled 2021-10-18 (×2): qty 1

## 2021-10-18 MED ORDER — AMLODIPINE BESYLATE 10 MG PO TABS
10.0000 mg | ORAL_TABLET | Freq: Every day | ORAL | Status: DC
  Administered 2021-10-18 – 2021-10-19 (×2): 10 mg via ORAL
  Filled 2021-10-18: qty 2
  Filled 2021-10-18: qty 1

## 2021-10-18 MED ORDER — ONDANSETRON HCL 4 MG/2ML IJ SOLN: 4.0000 mg | Freq: Four times a day (QID) | INTRAMUSCULAR | Status: DC | PRN

## 2021-10-18 MED ORDER — ENOXAPARIN SODIUM 40 MG/0.4ML IJ SOSY
40.0000 mg | PREFILLED_SYRINGE | Freq: Every day | INTRAMUSCULAR | Status: DC
  Administered 2021-10-18 – 2021-10-19 (×2): 40 mg via SUBCUTANEOUS
  Filled 2021-10-18 (×2): qty 0.4

## 2021-10-18 NOTE — ED Provider Triage Note (Signed)
Emergency Medicine Provider Triage Evaluation Note  Casey Acosta , a 51 y.o. female  was evaluated in triage.  Pt complains of chest pain which woke her from sleep. Reports pressure in her chest with sensation of SOB, restlessness. Symptoms did not improve with SL NTG x 3. Had some improvement after EMS gave 324mg  ASA and an additional NTG in transport. Currently is chest pain free. Denies shoulder pain, jaw pain, diaphoresis, vomiting. Hx of CAD with DES x2 in 2021.  Review of Systems  Positive: As above Negative: As above  Physical Exam  BP (!) 149/89 (BP Location: Right Arm)    Pulse 93    Temp 98.2 F (36.8 C) (Oral)    Resp 16    SpO2 94%  Gen:   Awake, no distress   Resp:  Normal effort  MSK:   Moves extremities without difficulty  Other:  Heart RRR  Medical Decision Making  Medically screening exam initiated at 1:40 AM.  Appropriate orders placed.  Casey Acosta was informed that the remainder of the evaluation will be completed by another provider, this initial triage assessment does not replace that evaluation, and the importance of remaining in the ED until their evaluation is complete.  Chest pain - pending labs, CXR, EKG. Discussed with patient need for delta troponin draw after 2 hours for trending. She verbalizes understanding.   Antonietta Breach, PA-C 10/18/21 [phone]

## 2021-10-18 NOTE — Consult Note (Signed)
Cardiology Consultation:   Patient ID: Casey Acosta MRN: [id number]; DOB: 17-Jan-1971  Admit date: 10/18/2021 Date of Consult: 10/18/2021  PCP:  Billie Ruddy, MD   [facility] Providers Cardiologist:  Kirk Ruths, MD   {     Patient Profile:   Casey Acosta is a 51 y.o. female with a hx of CAD with prior MI and stent to LCX who is being seen 10/18/2021 for the evaluation of chest pain at the request of Dr Lorin Mercy.  History of Present Illness:   Casey Acosta 51 yo female history of CAD with prior inferolateral MI Dec 2021, had stent to LCX. HTN, HL, CKD IV presents with chest pain.  Reports episode awoke her from sleep. Sharp pain left side chest 10/10 with some SOB, nausea. Not positional.Took NG x 3 at home without relief. After additional NG with EMS pain did improve. Pain lasted about 1 hour from onset. Reports pain similar to what she had in 2021 during her MI   K 3.4 BUN 20 Cr 2.24 WBC 11.8 Hgb 10.9 Plt 359  Trop 23-->21 COVID neg flu neg EKG SR, LVH, lateral TWIs more prominent      - December 2021 Cardiac catheterization:  revealed 60% proximal LAD, 50% mid LAD, 80% first diagonal, 50% ramus, occluded circumflex, 80% proximal to mid right coronary artery, 80% LPAV, 90% third posterior lateral.  Patient had PCI of left circumflex at that time  - 07/2020 echo LVEF 55-60%, grade III - Jan 2023 echo: LVEF 50%, no WMAs, grade I dd, normal RV, small pericaridal effusion,  mild MR     Past Medical History:  Diagnosis Date   Anemia    Anxiety and depression    Chronic kidney disease    Colon polyp    Diabetes mellitus without complication (HCC)    High risk sexual behavior    History of syncope    Hyperlipidemia    Hypertension    Internal hemorrhoids    LVH (left ventricular hypertrophy)    Obesity (BMI 30-39.9)    Ovarian mass    Panic disorder    PCOS (polycystic ovarian syndrome)    STEMI (ST elevation myocardial infarction) ([place]) 07/2020    Surgical menopause    Vaginal trichomoniasis     Past Surgical History:  Procedure Laterality Date   ABDOMINAL HYSTERECTOMY     partial   APPENDECTOMY     CHOLECYSTECTOMY     CORONARY/GRAFT ACUTE MI REVASCULARIZATION N/A 08/04/2020   Procedure: Coronary/Graft Acute MI Revascularization;  Surgeon: Martinique, Peter M, MD;  Location: [facility];  Service: Cardiovascular;  Laterality: N/A;   LEFT HEART CATH AND CORONARY ANGIOGRAPHY N/A 08/04/2020   Procedure: LEFT HEART CATH AND CORONARY ANGIOGRAPHY;  Surgeon: Martinique, Peter M, MD;  Location: [place] [facility];  Service: Cardiovascular;  Laterality: N/A;   TEE WITHOUT CARDIOVERSION N/A 05/27/2020   Procedure: TRANSESOPHAGEAL ECHOCARDIOGRAM (TEE);  Surgeon: Josue Hector, MD;  Location: [facility];  Service: Cardiovascular;  Laterality: N/A;   TUBAL LIGATION        Inpatient Medications: Scheduled Meds:  amLODipine  10 mg Oral Daily   aspirin EC  81 mg Oral Daily   atorvastatin  80 mg Oral QHS   carvedilol  25 mg Oral BID WC   cloNIDine  0.2 mg Transdermal Q Sat   enoxaparin (LOVENOX) injection  40 mg Subcutaneous Daily   hydrALAZINE  100 mg Oral TID   [START ON 10/19/2021] influenza vac split quadrivalent  PF  0.5 mL Intramuscular Tomorrow-1000   insulin aspart  0-15 Units Subcutaneous TID WC   insulin aspart  0-5 Units Subcutaneous QHS   [START ON 10/19/2021] pneumococcal 20-valent conjugate vaccine  0.5 mL Intramuscular Tomorrow-1000   sertraline  100 mg Oral QHS   sodium chloride flush  3 mL Intravenous Q12H   Continuous Infusions:  sodium chloride     PRN Meds: sodium chloride, acetaminophen, cyclobenzaprine, hydrOXYzine, nitroGLYCERIN, ondansetron (ZOFRAN) IV, sodium chloride flush  Allergies:    Allergies  Allergen Reactions   Dilaudid [Hydromorphone] Nausea And Vomiting   Prozac [Fluoxetine Hcl]     hallucinations    Social History:   Social History   Socioeconomic History   Marital status: Married     Spouse name: Not on file   Number of children: Not on file   Years of education: Not on file   Highest education level: Not on file  Occupational History   Occupation: nurse  Tobacco Use   Smoking status: Never   Smokeless tobacco: Never  Vaping Use   Vaping Use: Never used  Substance and Sexual Activity   Alcohol use: Not Currently    Comment: Occas. wine   Drug use: No   Sexual activity: Yes    Birth control/protection: Surgical    Comment: HYST.-1st intercourse 51 yo-More than 5 partners  Other Topics Concern   Not on file  Social History Narrative   Not on file   Social Determinants of Health   Financial Resource Strain: Not on file  Food Insecurity: Not on file  Transportation Needs: Not on file  Physical Activity: Not on file  Stress: Not on file  Social Connections: Not on file  Intimate Partner Violence: Not on file    Family History:   Family History  Problem Relation Age of Onset   Breast cancer Mother    Anxiety disorder Mother    Hyperlipidemia Mother    Depression Mother    Hypertension Mother    Diabetes Mother    Thyroid disease Mother    ALS Father    Hypertension Sister    Diabetes Sister    Crohn's disease Sister    Breast cancer Maternal Aunt    Breast cancer Maternal Aunt    Crohn's disease Brother      ROS:  Please see the history of present illness.   All other ROS reviewed and negative.     Physical Exam/Data:   Vitals:   10/18/21 1000 10/18/21 1111 10/18/21 1202 10/18/21 1239  BP: (!) 150/79 (!) 143/91 136/81 (!) 147/94  Pulse: 79  81 79  Resp: 17  13 15   Temp:    97.6 F (36.4 C)  TempSrc:    Oral  SpO2: 98%  97% 100%  Weight:    92 kg  Height:    5\' 6"  (1.676 m)   No intake or output data in the 24 hours ending 10/18/21 1345 Last 3 Weights 10/18/2021 09/17/2021 06/09/2021  Weight (lbs) 202 lb 14.4 oz 213 lb 201 lb  Weight (kg) 92.035 kg 96.616 kg 91.173 kg     Body mass index is 32.75 kg/m.  General:  Well  nourished, well developed, in no acute distress HEENT: normal Neck: no JVD Vascular: No carotid bruits; Distal pulses 2+ bilaterally Cardiac:  normal S1, S2; RRR; no murmur  Lungs:  clear to auscultation bilaterally, no wheezing, rhonchi or rales  Abd: soft, nontender, no hepatomegaly  Ext: no edema Musculoskeletal:  No  deformities, BUE and BLE strength normal and equal Skin: warm and dry  Neuro:  CNs 2-12 intact, no focal abnormalities noted Psych:  Normal affect     Laboratory Data:  High Sensitivity Troponin:   Recent Labs  Lab 10/18/21 0130 10/18/21 0418  TROPONINIHS 23* 21*     Chemistry Recent Labs  Lab 10/18/21 0130  NA 137  K 3.4*  CL 104  CO2 21*  GLUCOSE 240*  BUN 20  CREATININE 2.24*  CALCIUM 9.4  GFRNONAA 26*  ANIONGAP 12    No results for input(s): PROT, ALBUMIN, AST, ALT, ALKPHOS, BILITOT in the last 168 hours. Lipids  Recent Labs  Lab 10/18/21 1244  CHOL 192  TRIG 232*  HDL 37*  LDLCALC 109*  CHOLHDL 5.2    Hematology Recent Labs  Lab 10/18/21 0130  WBC 11.8*  RBC 4.12  HGB 10.9*  HCT 33.8*  MCV 82.0  MCH 26.5  MCHC 32.2  RDW 15.4  PLT 359   Thyroid  Recent Labs  Lab 10/18/21 1244  TSH 1.426    BNPNo results for input(s): BNP, PROBNP in the last 168 hours.  DDimer No results for input(s): DDIMER in the last 168 hours.   Radiology/Studies:  DG Chest 2 View  Result Date: 10/18/2021 CLINICAL DATA:  Chest pain. EXAM: CHEST - 2 VIEW COMPARISON:  08/04/2020. FINDINGS: The heart is mildly enlarged and the mediastinal contour is within normal limits. No consolidation, effusion, or pneumothorax. No acute osseous abnormality. Surgical clips are present in the right upper quadrant. IMPRESSION: Mild cardiomegaly with no acute process. Electronically Signed   By: Brett Fairy M.D.   On: 10/18/2021 01:42     Assessment and Plan:   1.CAD - history of MI in 2021 with stent to LCX - presents with chest pain. Mild flat trop thus far  not consistent with ACS. Chronic lateral ST/T chagnes more prominent on presentation - follow trop trend. Recent echo would not repeat. With renal dysfunction higher threshold for cath. Likey plan for lexisscan in the AM pending trop trends.  - if further elevation of troponin or recurrent pain could start hep gtt - continue medical therapy with ASA 81, atorva 80, coreg 25mg  bid  NPO at midnight.    Risk Assessment/Risk Scores:   For questions or updates, please contact [place] Please consult [email or web address] for contact info under    Signed, Carlyle Dolly, MD  10/18/2021 1:45 PM

## 2021-10-18 NOTE — ED Provider Notes (Signed)
[facility] EMERGENCY DEPARTMENT Provider Note   CSN: [id number] Arrival date & time: 10/18/21  0116     History  Chief Complaint  Patient presents with   Chest Pain    Casey Acosta is a 51 y.o. female.  The history is provided by the patient.  Chest Pain Pain location:  L chest Pain quality: pressure   Pain radiates to:  Does not radiate Pain severity:  Moderate Onset quality:  Gradual Timing:  Constant Progression:  Improving Chronicity:  New Relieved by:  Aspirin and nitroglycerin Worsened by:  Nothing Associated symptoms: nausea and shortness of breath   Associated symptoms: no abdominal pain, no cough, no diaphoresis and no vomiting   Risk factors: coronary artery disease   Patient with known history of CAD presents with chest pain.  She reports approximately 6 hours ago she had onset of left-sided chest pain and pressure.  There was some sharp component to the pain as well.  She also reports shortness of breath.  Initial nitroglycerin did not improve her pain, but after for nitroglycerin and aspirin she improved. She does not get this chest pain frequently    Home Medications Prior to Admission medications   Medication Sig Start Date End Date Taking? Authorizing Provider  hydrALAZINE (APRESOLINE) 100 MG tablet Take 1 tablet by mouth 4 times daily. 10/07/21   Billie Ruddy, MD  amLODipine (NORVASC) 10 MG tablet Take 1 tablet (10 mg total) by mouth daily. 06/09/21   Billie Ruddy, MD  aspirin EC 81 MG tablet Take 81 mg by mouth daily.    [provider]  atorvastatin (LIPITOR) 80 MG tablet Take 1 tablet (80 mg total) by mouth daily. 09/26/21   Lelon Perla, MD  blood glucose meter kit and supplies KIT Dispense based on patient and insurance preference. Use up to four times daily as directed. 06/09/21   Billie Ruddy, MD  carvedilol (COREG) 25 MG tablet TAKE ONE TABLET BY MOUTH TWICE A DAY WITH A MEAL 06/09/21   Billie Ruddy,  MD  cloNIDine (CATAPRES - DOSED IN MG/24 HR) 0.2 mg/24hr patch Place 1 patch (0.2 mg total) onto the skin once a week. 09/17/21   Lelon Perla, MD  cyclobenzaprine (FLEXERIL) 5 MG tablet Take 1 tablet (5 mg total) by mouth at bedtime. Patient taking differently: Take 5 mg by mouth as needed. 06/09/21   Billie Ruddy, MD  glimepiride (AMARYL) 1 MG tablet TAKE ONE TABLET BY MOUTH DAILY WITH BREAKFAST. 07/21/21   Billie Ruddy, MD  glucose blood test strip One-step Ultra meter.  Use as instructed 12/02/20   Billie Ruddy, MD  hydrOXYzine (ATARAX/VISTARIL) 50 MG tablet Take 1 tablet (50 mg total) by mouth every 6 (six) hours as needed for anxiety. 06/09/21   Billie Ruddy, MD  Multiple Vitamin (MULTIVITAMIN PO) Take by mouth.    [provider]  nitroGLYCERIN (NITROSTAT) 0.4 MG SL tablet Place 1 tablet (0.4 mg total) under the tongue every 5 (five) minutes as needed. 05/27/21   Almyra Deforest, PA  OZEMPIC, 0.25 OR 0.5 MG/DOSE, 2 MG/1.5ML SOPN DIAL AND INJECT UNDER THE SKIN 0.5 MG WEEKLY 04/21/21   Billie Ruddy, MD  sertraline (ZOLOFT) 100 MG tablet TAKE 1/2 TABLET BY MOUTH DAILY 09/29/21   Billie Ruddy, MD      Allergies    Dilaudid [hydromorphone] and Prozac [fluoxetine hcl]    Review of Systems   Review of Systems  Constitutional:  Negative for diaphoresis.  Respiratory:  Positive for shortness of breath. Negative for cough.   Cardiovascular:  Positive for chest pain and leg swelling.  Gastrointestinal:  Positive for nausea. Negative for abdominal pain and vomiting.  All other systems reviewed and are negative.  Physical Exam Updated Vital Signs BP (!) 143/77    Pulse 87    Temp 98.2 F (36.8 C) (Oral)    Resp 16    SpO2 95%  Physical Exam CONSTITUTIONAL: Well developed/well nourished, resting comfortably HEAD: Normocephalic/atraumatic EYES: EOMI/PERRL ENMT: Mucous membranes moist NECK: supple no meningeal signs SPINE/BACK:entire spine nontender CV: S1/S2  noted, no murmurs/rubs/gallops noted LUNGS: Lungs are clear to auscultation bilaterally, no apparent distress ABDOMEN: soft, nontender, no rebound or guarding, bowel sounds noted throughout abdomen GU:no cva tenderness NEURO: Pt is awake/alert/appropriate, moves all extremitiesx4.  No facial droop.   EXTREMITIES: pulses normal/equalx4, full ROM, symmetric edema to bilateral lower extremities SKIN: warm, color normal PSYCH: no abnormalities of mood noted, alert and oriented to situation  ED Results / Procedures / Treatments   Labs (all labs ordered are listed, but only abnormal results are displayed) Labs Reviewed  BASIC METABOLIC PANEL - Abnormal; Notable for the following components:      Result Value   Potassium 3.4 (*)    CO2 21 (*)    Glucose, Bld 240 (*)    Creatinine, Ser 2.24 (*)    GFR, Estimated 26 (*)    All other components within normal limits  CBC - Abnormal; Notable for the following components:   WBC 11.8 (*)    Hemoglobin 10.9 (*)    HCT 33.8 (*)    All other components within normal limits  TROPONIN I (HIGH SENSITIVITY) - Abnormal; Notable for the following components:   Troponin I (High Sensitivity) 23 (*)    All other components within normal limits  TROPONIN I (HIGH SENSITIVITY) - Abnormal; Notable for the following components:   Troponin I (High Sensitivity) 21 (*)    All other components within normal limits  I-STAT BETA HCG BLOOD, ED ([facility], [facility], [facility] ONLY)    EKG EKG Interpretation  Date/Time:  Saturday October 18 2021 01:16:16 EST Ventricular Rate:  95 PR Interval:  144 QRS Duration: 82 QT Interval:  370 QTC Calculation: 464 R Axis:   -19 Text Interpretation: Normal sinus rhythm Moderate voltage criteria for LVH, may be normal variant ( R in aVL , Cornell product ) T wave abnormality, consider lateral ischemia Prolonged QT Abnormal ECG Confirmed by Ripley Fraise [id number]) on 10/18/2021 4:49:36 AM  Radiology DG Chest 2 View  Result Date:  10/18/2021 CLINICAL DATA:  Chest pain. EXAM: CHEST - 2 VIEW COMPARISON:  08/04/2020. FINDINGS: The heart is mildly enlarged and the mediastinal contour is within normal limits. No consolidation, effusion, or pneumothorax. No acute osseous abnormality. Surgical clips are present in the right upper quadrant. IMPRESSION: Mild cardiomegaly with no acute process. Electronically Signed   By: Brett Fairy M.D.   On: 10/18/2021 01:42    Procedures Procedures    Medications Ordered in ED Medications - No data to display  ED Course/ Medical Decision Making/ A&P                           Medical Decision Making Amount and/or Complexity of Data Reviewed Labs: ordered. Radiology: ordered.  Risk Decision regarding hospitalization.   This patient presents to the ED for concern of chest pain, this  involves an extensive number of treatment options, and is a complaint that carries with it a high risk of complications and morbidity.  The differential diagnosis includes acute coronary syndrome, aortic dissection, pulmonary embolism, pneumothorax, pericarditis  Comorbidities that complicate the patient evaluation: Patients presentation is complicated by their history of CAD    Additional history obtained: Additional history obtained from spouse Records reviewed previous admission documents  Lab Tests: I Ordered, and personally interpreted labs.  The pertinent results include: Renal insufficiency, elevated troponin  Imaging Studies ordered: I ordered imaging studies including X-ray chest I independently visualized and interpreted imaging which showed no acute findings I agree with the radiologist interpretation  Cardiac Monitoring: The patient was maintained on a cardiac monitor.  I personally viewed and interpreted the cardiac monitor which showed an underlying rhythm of:  sinus rhythm   Consultations Obtained: I requested consultation with the admitting physician Dr. Marlowe Sax and consultant  Dr. Gaynelle Arabian, and discussed  findings as well as pertinent plan - they recommend: Admission to the medical service Patient history of CAD, with elevated troponin in the setting of renal sufficiency.  Will need further evaluation for chest pain.  Reevaluation: After the interventions noted above, I reevaluated the patient and found that they have :improved  Complexity of problems addressed: Patients presentation is most consistent with  acute presentation with potential threat to life or bodily function      Disposition: After consideration of the diagnostic results and the patients response to treatment,  I feel that the patent would benefit from admission .            Final Clinical Impression(s) / ED Diagnoses Final diagnoses:  Unstable angina [facility])    Rx / DC Orders ED Discharge Orders     None         Ripley Fraise, MD 10/18/21 [phone]

## 2021-10-18 NOTE — Assessment & Plan Note (Addendum)
Creatinine 2.26, close to baseline.  Recommend repeat BMP in next PCP/specialist visit.

## 2021-10-18 NOTE — ED Notes (Signed)
Pt ambulated to bathroom independently and in NAD

## 2021-10-18 NOTE — Assessment & Plan Note (Addendum)
Body mass index is 32.75 kg/m.  Discussed with patient needs for aggressive lifestyle changes/weight loss as this complicates all facets of care.  Outpatient follow-up with PCP.

## 2021-10-18 NOTE — H&P (Signed)
History and Physical    Patient: Casey Acosta LDJ:[id number] DOB: 1970/11/07 DOA: 10/18/2021 DOS: the patient was seen and examined on 10/18/2021 PCP: Billie Ruddy, MD  Patient coming from: Home - lives with husband; NOK: Husband, [phone]   Chief Complaint: Chest pain  HPI: Casey Acosta is a 51 y.o. female with medical history significant of anxiety/depression; HTN; HLD; DM; CAD; and obesity presenting with chest pain.  Echo on 09/23/21 showed low-normal EF and grade 1 diastolic dysfunction.  She was sleeping last night and couldn't get comfortable, kept waking up.  She got up but it felt similar to her last MI.  She was having L-sided CP.  She took NTG x3 without improvement.  EMS came and gave her 4 ASA without reflief.  She got one more NTG and she finally got relief.  It resolved and has not recurred, although she still feels like something isn't quite right.   She is always SOB - just a little bit of exertion, she has to sit down to catch her breath.  Her STEMI was in 07/2020 and she required 2 stents.  She has not had an ischemic evaluation since then.    ER Course:  Carryover per Dr. Marlowe Sax:  51 year old with history of hypertension, hyperlipidemia, CAD, CKD stage IV presenting with chest pain which was relieved after nitroglycerin x4.  She was also given aspirin.  Troponin mildly elevated but stable.  Cardiology recommended admission for further work-up.     Review of Systems: As mentioned in the history of present illness. All other systems reviewed and are negative. Past Medical History:  Diagnosis Date   Anemia    Anxiety and depression    Chronic kidney disease    Colon polyp    Diabetes mellitus without complication (HCC)    High risk sexual behavior    History of syncope    Hyperlipidemia    Hypertension    Internal hemorrhoids    LVH (left ventricular hypertrophy)    Obesity (BMI 30-39.9)    Ovarian mass    Panic disorder    PCOS (polycystic ovarian  syndrome)    STEMI (ST elevation myocardial infarction) ([place]) 07/2020   Surgical menopause    Vaginal trichomoniasis    Past Surgical History:  Procedure Laterality Date   ABDOMINAL HYSTERECTOMY     partial   APPENDECTOMY     CHOLECYSTECTOMY     CORONARY/GRAFT ACUTE MI REVASCULARIZATION N/A 08/04/2020   Procedure: Coronary/Graft Acute MI Revascularization;  Surgeon: Martinique, Peter M, MD;  Location: [facility];  Service: Cardiovascular;  Laterality: N/A;   LEFT HEART CATH AND CORONARY ANGIOGRAPHY N/A 08/04/2020   Procedure: LEFT HEART CATH AND CORONARY ANGIOGRAPHY;  Surgeon: Martinique, Peter M, MD;  Location: [facility];  Service: Cardiovascular;  Laterality: N/A;   TEE WITHOUT CARDIOVERSION N/A 05/27/2020   Procedure: TRANSESOPHAGEAL ECHOCARDIOGRAM (TEE);  Surgeon: Josue Hector, MD;  Location[facility] END[facility];  Service: Cardiovascular;  Laterality: N/A;   TUBAL LIGATION     Social History:  reports that she has never smoked. She has never used smokeless tobacco. She reports that she does not currently use alcohol. She reports that she does not use drugs.  Allergies  Allergen Reactions   Dilaudid [Hydromorphone] Nausea And Vomiting   Prozac [Fluoxetine Hcl]     hallucinations    Family History  Problem Relation Age of Onset   Breast cancer Mother    Anxiety disorder Mother    Hyperlipidemia Mother  Depression Mother    Hypertension Mother    Diabetes Mother    Thyroid disease Mother    ALS Father    Hypertension Sister    Diabetes Sister    Crohn's disease Sister    Breast cancer Maternal Aunt    Breast cancer Maternal Aunt    Crohn's disease Brother     Prior to Admission medications   Medication Sig Start Date End Date Taking? Authorizing Provider  amLODipine (NORVASC) 10 MG tablet Take 1 tablet (10 mg total) by mouth daily. 06/09/21  Yes Billie Ruddy, MD  aspirin EC 81 MG tablet Take 81 mg by mouth daily.   Yes [provider]   aspirin-acetaminophen-caffeine (EXCEDRIN MIGRAINE) [phone] MG tablet Take 2 tablets by mouth every 6 (six) hours as needed for headache or migraine.   Yes [provider]  atorvastatin (LIPITOR) 80 MG tablet Take 1 tablet (80 mg total) by mouth daily. Patient taking differently: Take 80 mg by mouth at bedtime. 09/26/21  Yes Lelon Perla, MD  blood glucose meter kit and supplies KIT Dispense based on patient and insurance preference. Use up to four times daily as directed. 06/09/21  Yes Billie Ruddy, MD  carvedilol (COREG) 25 MG tablet TAKE ONE TABLET BY MOUTH TWICE A DAY WITH A MEAL Patient taking differently: Take 25 mg by mouth 2 (two) times daily with a meal. 06/09/21  Yes Billie Ruddy, MD  cloNIDine (CATAPRES - DOSED IN MG/24 HR) 0.2 mg/24hr patch Place 1 patch (0.2 mg total) onto the skin once a week. Patient taking differently: Place 0.2 mg onto the skin every Saturday. 09/17/21  Yes Lelon Perla, MD  cyclobenzaprine (FLEXERIL) 5 MG tablet Take 1 tablet (5 mg total) by mouth at bedtime. Patient taking differently: Take 5 mg by mouth 3 (three) times daily as needed for muscle spasms. 06/09/21  Yes Billie Ruddy, MD  glimepiride (AMARYL) 1 MG tablet TAKE ONE TABLET BY MOUTH DAILY WITH BREAKFAST. Patient taking differently: Take 1 mg by mouth daily with breakfast. 07/21/21  Yes Billie Ruddy, MD  glucose blood test strip One-step Ultra meter.  Use as instructed 12/02/20  Yes Billie Ruddy, MD  hydrALAZINE (APRESOLINE) 100 MG tablet Take 1 tablet by mouth 4 times daily. Patient taking differently: Take 100 mg by mouth 3 (three) times daily. 10/07/21  Yes Billie Ruddy, MD  hydrOXYzine (ATARAX/VISTARIL) 50 MG tablet Take 1 tablet (50 mg total) by mouth every 6 (six) hours as needed for anxiety. 06/09/21  Yes Billie Ruddy, MD  loratadine (CLARITIN) 10 MG tablet Take 10 mg by mouth daily as needed for allergies.   Yes [provider]  Multiple  Vitamin (MULTIVITAMIN PO) Take 1 tablet by mouth daily.   Yes [provider]  Multiple Vitamins-Minerals (HAIR SKIN AND NAILS FORMULA PO) Take 1 tablet by mouth daily.   Yes [provider]  nitroGLYCERIN (NITROSTAT) 0.4 MG SL tablet Place 1 tablet (0.4 mg total) under the tongue every 5 (five) minutes as needed. Patient taking differently: Place 0.4 mg under the tongue every 5 (five) minutes as needed for chest pain. 05/27/21  Yes Almyra Deforest, PA  sertraline (ZOLOFT) 100 MG tablet TAKE 1/2 TABLET BY MOUTH DAILY Patient taking differently: Take 100 mg by mouth at bedtime. 09/29/21  Yes Billie Ruddy, MD  OZEMPIC, 0.25 OR 0.5 MG/DOSE, 2 MG/1.5ML SOPN DIAL AND INJECT UNDER THE SKIN 0.5 MG WEEKLY Patient not taking: Reported  on 10/18/2021 04/21/21   Billie Ruddy, MD    Physical Exam: Vitals:   10/18/21 1000 10/18/21 1111 10/18/21 1202 10/18/21 1239  BP: (!) 150/79 (!) 143/91 136/81 (!) 147/94  Pulse: 79  81 79  Resp: _0 Temp:    97.6 F (36.4 C)  TempSrc:    Oral  SpO2: 98%  97% 100%  Weight:    92 kg  Height:    _1  (1.676 m)   General:  Appears calm and comfortable and is in NAD Eyes:  PERRL, EOMI, normal iris, +exophthalmos ENT:  grossly normal hearing, lips & tongue, mmm; appropriate dentition Neck:  no LAD, masses or thyromegaly Cardiovascular:  RRR, no m/r/g. No LE edema.  Respiratory:   CTA bilaterally with no wheezes/rales/rhonchi.  Normal respiratory effort. Abdomen:  soft, NT, ND Skin:  no rash or induration seen on limited exam Musculoskeletal:  grossly normal tone BUE/BLE, good ROM, no bony abnormality Psychiatric:  grossly normal mood and affect, speech fluent and appropriate, AOx3 Neurologic:  CN 2-12 grossly intact, moves all extremities in coordinated fashion   Radiological Exams on Admission: Independently reviewed - see discussion in A/P where applicable  DG Chest 2 View  Result Date: 10/18/2021 CLINICAL DATA:  Chest pain. EXAM:  CHEST - 2 VIEW COMPARISON:  08/04/2020. FINDINGS: The heart is mildly enlarged and the mediastinal contour is within normal limits. No consolidation, effusion, or pneumothorax. No acute osseous abnormality. Surgical clips are present in the right upper quadrant. IMPRESSION: Mild cardiomegaly with no acute process. Electronically Signed   By: Brett Fairy M.D.   On: 10/18/2021 01:42    EKG: Independently reviewed.  NSR with rate 95; nonspecific ST changes with no evidence of acute ischemia   Labs on Admission: I have personally reviewed the available labs and imaging studies at the time of the admission.  Pertinent labs:    K+ 3.4 Glucose 240 BUN 20/Creatinine 2.4/GFR 26; 20/1.94/31 on 1/24 HS troponin 23, 21 Lipids on 1/24: 183/39/101/255 WBC 11.8 Hgb 10.9 A1c on 10/10 was 7.8 HCG negative    Assessment and Plan: * Chest pain with high risk for cardiac etiology- (present on admission) -Patient with left-sided chest pressure that started with sleep, required 4 NTG to resolve; this is similar to her prior index symptoms when she had STEMI in 07/2020 -2/3 typical symptoms suggestive of atypical cardiac chest pain.  -CXR unremarkable.   -Initial cardiac HS troponin minimally elevated with a negative delta.  -EKG not indicative of acute ischemia.   -HEART pathway score is 5, indicating that the patient has an elevated risk score and requires further evaluation. -Will plan to place in observation status on telemetry to rule out ACS by overnight observation.  -Start ASA 81 mg daily -Risk factor stratification with HgbA1c and FLP; will also check TSH and UDS -Cardiology consultation   HTN -Takes amlodipine, carvedilol, Catapres TTS, and hydralazine at home  -Will also add prn hydralazine  HLD -Continue Lipitor 80 mg -LDL is 109 -She reports daily compliance -She is likely to benefit from the lipid clinic  DM -A1c is 7.9, indicating poor control -Hold Amaryl -Will cover with  moderate-scale SSI for now -Diabetes coordinator consulted  Class 1 obesity- (present on admission) -Body mass index is 32.75 kg/m..  -Weight loss should be encouraged -Outpatient PCP/bariatric medicine f/u encouraged  CKD (chronic kidney disease) stage 4, GFR 15-29 ml/min (HCC)- (present on admission) -Creatinine is mildly worse than prior but likely  at/near baseline -Avoid nephrotoxic medications when possible -Will follow BMP  Anxiety and depression- (present on admission) -Continue sertraline, hydroxyzine     Advance Care Planning:   Code Status: Full Code   Consults: Cardiology  DVT Prophylaxis: Lovenox  Family Communication: None present; she is capable of communicating with her family at this time  Severity of Illness: The appropriate patient status for this patient is OBSERVATION. Observation status is judged to be reasonable and necessary in order to provide the required intensity of service to ensure the patient's safety. The patient's presenting symptoms, physical exam findings, and initial radiographic and laboratory data in the context of their medical condition is felt to place them at decreased risk for further clinical deterioration. Furthermore, it is anticipated that the patient will be medically stable for discharge from the hospital within 2 midnights of admission.   Author: Karmen Bongo, MD 10/18/2021 4:32 PM  For on call review [email or web address].

## 2021-10-18 NOTE — Assessment & Plan Note (Addendum)
Continue sertraline 100 mg p.o. daily, hydroxyzine 50 mg p.o. every 6 hours as needed anxiety.

## 2021-10-18 NOTE — ED Triage Notes (Signed)
Patient from home, having chest pain that started about one hour ago, woke her from her sleep.  She took 3 nitro pta of EMS.  No shortness of breath.  No nausea or vomiting.  Patient was hypertensive with EMS.  NSR on ekg.  She was tachy rate of 113 with EMS.  She is now 73.  Patient is 2/10 at this time with pain.  Patient was given 324mg  ASA en route by EMS.

## 2021-10-18 NOTE — ED Notes (Signed)
Pt ambulated to BR with steady gait.

## 2021-10-18 NOTE — Assessment & Plan Note (Addendum)
Patient presenting to ED with left-sided chest pressure on sleep onset during sleep with requirement of 4 nitroglycerin tablets to resolve.  History of CAD with STEMI 08/19/2020 s/p PCI/stents.  Chest x-ray unrevealing with high sensitive troponin mildly elevated.  EKG with no concerning dynamic changes.  Cardiology is consulted and followed during hospital course.  Patient underwent Lexiscan nuclear medicine stress test with results of no ST deviation, LVEF 45- 54%, with inferior lateral perfusion defects in rest and stress consistent with prior LCx or RCA infarct and small area of ischemia in the true apex.  Discussed with cardiology, Dr. Harl Bowie; stress/mainly showing old damage from prior MI and very mild area of potentially new blockage in the apex considered low risk and recommend medical management given her advanced kidney disease.  Started on Imdur 15 mg p.o. daily.  Outpatient follow-up with cardiology.

## 2021-10-18 NOTE — ED Notes (Signed)
Assumed pt care at this time

## 2021-10-19 ENCOUNTER — Observation Stay (HOSPITAL_BASED_OUTPATIENT_CLINIC_OR_DEPARTMENT_OTHER): Payer: Self-pay

## 2021-10-19 DIAGNOSIS — R079 Chest pain, unspecified: Secondary | ICD-10-CM

## 2021-10-19 LAB — NM MYOCAR MULTI W/SPECT W/WALL MOTION / EF
LV dias vol: 177 mL (ref 46–106)
LV sys vol: 86 mL
Peak HR: 90 {beats}/min
Rest HR: 75 {beats}/min
ST Depression (mm): 0 mm

## 2021-10-19 LAB — CBC
HCT: 33.5 % — ABNORMAL LOW (ref 36.0–46.0)
Hemoglobin: 10.6 g/dL — ABNORMAL LOW (ref 12.0–15.0)
MCH: 25.9 pg — ABNORMAL LOW (ref 26.0–34.0)
MCHC: 31.6 g/dL (ref 30.0–36.0)
MCV: 81.7 fL (ref 80.0–100.0)
Platelets: 341 10*3/uL (ref 150–400)
RBC: 4.1 MIL/uL (ref 3.87–5.11)
RDW: 15 % (ref 11.5–15.5)
WBC: 11 10*3/uL — ABNORMAL HIGH (ref 4.0–10.5)
nRBC: 0 % (ref 0.0–0.2)

## 2021-10-19 LAB — BASIC METABOLIC PANEL
Anion gap: 10 (ref 5–15)
BUN: 24 mg/dL — ABNORMAL HIGH (ref 6–20)
CO2: 20 mmol/L — ABNORMAL LOW (ref 22–32)
Calcium: 8.9 mg/dL (ref 8.9–10.3)
Chloride: 103 mmol/L (ref 98–111)
Creatinine, Ser: 2.26 mg/dL — ABNORMAL HIGH (ref 0.44–1.00)
GFR, Estimated: 25 mL/min — ABNORMAL LOW (ref >60)
Glucose, Bld: 209 mg/dL — ABNORMAL HIGH (ref 70–99)
Potassium: 3.7 mmol/L (ref 3.5–5.1)
Sodium: 133 mmol/L — ABNORMAL LOW (ref 135–145)

## 2021-10-19 LAB — PROTIME-INR
INR: 1 (ref 0.8–1.2)
Prothrombin Time: 12.8 seconds (ref 11.4–15.2)

## 2021-10-19 LAB — GLUCOSE, CAPILLARY
Glucose-Capillary: 158 mg/dL — ABNORMAL HIGH (ref 70–99)
Glucose-Capillary: 213 mg/dL — ABNORMAL HIGH (ref 70–99)
Glucose-Capillary: 219 mg/dL — ABNORMAL HIGH (ref 70–99)

## 2021-10-19 LAB — TROPONIN I (HIGH SENSITIVITY): Troponin I (High Sensitivity): 13 ng/L (ref <18)

## 2021-10-19 MED ORDER — REGADENOSON 0.4 MG/5ML IV SOLN
INTRAVENOUS | Status: AC
  Administered 2021-10-19: 0.4 mg via INTRAVENOUS
  Filled 2021-10-19: qty 5

## 2021-10-19 MED ORDER — TECHNETIUM TC 99M TETROFOSMIN IV KIT
10.5000 | PACK | Freq: Once | INTRAVENOUS | Status: AC | PRN
  Administered 2021-10-19: 10.5 via INTRAVENOUS

## 2021-10-19 MED ORDER — TECHNETIUM TC 99M TETROFOSMIN IV KIT
30.3000 | PACK | Freq: Once | INTRAVENOUS | Status: AC | PRN
  Administered 2021-10-19: 30.3 via INTRAVENOUS

## 2021-10-19 MED ORDER — ISOSORBIDE MONONITRATE ER 30 MG PO TB24
15.0000 mg | ORAL_TABLET | Freq: Every day | ORAL | Status: DC
  Administered 2021-10-19: 15 mg via ORAL
  Filled 2021-10-19: qty 1

## 2021-10-19 MED ORDER — REGADENOSON 0.4 MG/5ML IV SOLN: 0.4000 mg | Freq: Once | INTRAVENOUS | Status: AC

## 2021-10-19 MED ORDER — ISOSORBIDE MONONITRATE ER 30 MG PO TB24: 15.0000 mg | ORAL_TABLET | Freq: Every day | ORAL | 2 refills | Status: DC

## 2021-10-19 NOTE — Assessment & Plan Note (Signed)
Continue atorvastatin 80 mg p.o. daily.

## 2021-10-19 NOTE — Discharge Summary (Signed)
Physician Discharge Summary  Bettyjo Lundblad VQX:[id number] DOB: 03-10-1971 DOA: 10/18/2021  PCP: Billie Ruddy, MD  Admit date: 10/18/2021 Discharge date: 10/19/2021  Admitted From: Home Disposition: Home  Recommendations for Outpatient Follow-up:  Follow up with PCP in 1-2 weeks Follow-up with cardiology as scheduled; office will call for an appointment Started on Imdur 50 mg p.o. daily Please obtain BMP in one week to reassess renal function  Home Health: No Equipment/Devices: None  Discharge Condition: Stable CODE STATUS: Full code Diet recommendation: Heart healthy/consistent carb regular diet  History of present illness:  Nuriyah Hanline is a 51 year old female with past medical history significant for anxiety/depression, essential hypertension, hyperlipidemia, type 2 diabetes mellitus, chronic diastolic congestive heart failure, CAD/STEMI s/p PCI and stents 07/2020, obesity who presents to [facility] ED on 2/18 with complaints of chest pain.  Patient reports onset during sleep, localized to the left chest.  Associated with mild shortness of breath.  She reportedly took 3 nitroglycerin tablets without improvement.  EMS was called and was given 4 aspirin without relief.  She was also given 1 more nitroglycerin with now reported improvement in chest pain.  In the ED, temperature 98.2 F, HR 93, RR 16, BP 149/89, SPO2 94% on room air.  Sodium 137, potassium 3.4, chloride 104, CO2 21, glucose 240, BUN 20, creatinine 2.40.  WBC 11.8, hemoglobin 10.9, platelets 359.  hCG negative.  UDS positive for THC.  Chest x-ray with mild cardiomegaly, no acute cardiopulmonary disease process.  EKG with normal sinus rhythm, T wave inversions lead I/aVL; which is similar in appearance to EKG dated 02/17/2021.  Cardiology was consulted.  Hospital service consulted for further evaluation and management of chest pain.   Hospital course:  Assessment and Plan: * Chest pain with high risk for cardiac etiology-  (present on admission) Patient presenting to ED with left-sided chest pressure on sleep onset during sleep with requirement of 4 nitroglycerin tablets to resolve.  History of CAD with STEMI 08/19/2020 s/p PCI/stents.  Chest x-ray unrevealing with high sensitive troponin mildly elevated.  EKG with no concerning dynamic changes.  Cardiology is consulted and followed during hospital course.  Patient underwent Lexiscan nuclear medicine stress test with results of no ST deviation, LVEF 45- 54%, with inferior lateral perfusion defects in rest and stress consistent with prior LCx or RCA infarct and small area of ischemia in the true apex.  Discussed with cardiology, Dr. Harl Bowie; stress/mainly showing old damage from prior MI and very mild area of potentially new blockage in the apex considered low risk and recommend medical management given her advanced kidney disease.  Started on Imdur 15 mg p.o. daily.  Outpatient follow-up with cardiology.       Essential hypertension- (present on admission) Continue amlodipine 10 mg p.o. daily, carvedilol 25 mg p.o. twice daily, clonidine patch 0.2 mg weekly, hydralazine 100 mg p.o. 3 times daily, started on Imdur 15 mg p.o. daily by cardiology.  Outpatient follow-up with cardiology and PCP.  Uncontrolled type 2 diabetes mellitus with hyperglycemia, without long-term current use of insulin ([place])- (present on admission) Hemoglobin A1c 7.9, not optimally controlled.  Continue home glimepiride 1 mg p.o. daily.  Outpatient follow-up PCP.  CKD (chronic kidney disease) stage 4, GFR 15-29 ml/min (HCC)- (present on admission) Creatinine 2.26, close to baseline.  Recommend repeat BMP in next PCP/specialist visit.  Anxiety and depression- (present on admission) Continue sertraline 100 mg p.o. daily, hydroxyzine 50 mg p.o. every 6 hours as needed anxiety.  Mixed diabetic hyperlipidemia associated  with type 2 diabetes mellitus ([place])- (present on admission) Continue atorvastatin  80 mg p.o. daily.  Class 1 obesity- (present on admission) Body mass index is 32.75 kg/m.  Discussed with patient needs for aggressive lifestyle changes/weight loss as this complicates all facets of care.  Outpatient follow-up with PCP.         Discharge Diagnoses:  Principal Problem:   Chest pain with high risk for cardiac etiology Active Problems:   Essential hypertension   Uncontrolled type 2 diabetes mellitus with hyperglycemia, without long-term current use of insulin (HCC)   CKD (chronic kidney disease) stage 4, GFR 15-29 ml/min (HCC)   Anxiety and depression   Mixed diabetic hyperlipidemia associated with type 2 diabetes mellitus (HCC)   Class 1 obesity    Discharge Instructions  Discharge Instructions     Call MD for:  difficulty breathing, headache or visual disturbances   Complete by: As directed    Call MD for:  extreme fatigue   Complete by: As directed    Call MD for:  persistant dizziness or light-headedness   Complete by: As directed    Call MD for:  persistant nausea and vomiting   Complete by: As directed    Call MD for:  severe uncontrolled pain   Complete by: As directed    Call MD for:  temperature >100.4   Complete by: As directed    Diet - low sodium heart healthy   Complete by: As directed    Increase activity slowly   Complete by: As directed       Allergies as of 10/19/2021       Reactions   Dilaudid [hydromorphone] Nausea And Vomiting   Prozac [fluoxetine Hcl]    hallucinations        Medication List     TAKE these medications    amLODipine 10 MG tablet Commonly known as: NORVASC Take 1 tablet (10 mg total) by mouth daily.   aspirin EC 81 MG tablet Take 81 mg by mouth daily.   aspirin-acetaminophen-caffeine 250-250-65 MG tablet Commonly known as: EXCEDRIN MIGRAINE Take 2 tablets by mouth every 6 (six) hours as needed for headache or migraine.   atorvastatin 80 MG tablet Commonly known as: LIPITOR Take 1 tablet (80 mg  total) by mouth daily. What changed: when to take this   blood glucose meter kit and supplies Kit Dispense based on patient and insurance preference. Use up to four times daily as directed.   carvedilol 25 MG tablet Commonly known as: COREG TAKE ONE TABLET BY MOUTH TWICE A DAY WITH A MEAL What changed:  how much to take how to take this when to take this additional instructions   cloNIDine 0.2 mg/24hr patch Commonly known as: CATAPRES - Dosed in mg/24 hr Place 1 patch (0.2 mg total) onto the skin once a week. What changed: when to take this   cyclobenzaprine 5 MG tablet Commonly known as: FLEXERIL Take 1 tablet (5 mg total) by mouth at bedtime. What changed:  when to take this reasons to take this   glimepiride 1 MG tablet Commonly known as: AMARYL TAKE ONE TABLET BY MOUTH DAILY WITH BREAKFAST.   glucose blood test strip One-step Ultra meter.  Use as instructed   HAIR SKIN AND NAILS FORMULA PO Take 1 tablet by mouth daily.   hydrALAZINE 100 MG tablet Commonly known as: APRESOLINE Take 1 tablet by mouth 4 times daily. What changed: when to take this   hydrOXYzine 50 MG tablet  Commonly known as: ATARAX Take 1 tablet (50 mg total) by mouth every 6 (six) hours as needed for anxiety.   isosorbide mononitrate 30 MG 24 hr tablet Commonly known as: IMDUR Take 0.5 tablets (15 mg total) by mouth daily.   loratadine 10 MG tablet Commonly known as: CLARITIN Take 10 mg by mouth daily as needed for allergies.   MULTIVITAMIN PO Take 1 tablet by mouth daily.   nitroGLYCERIN 0.4 MG SL tablet Commonly known as: Nitrostat Place 1 tablet (0.4 mg total) under the tongue every 5 (five) minutes as needed. What changed: reasons to take this   sertraline 100 MG tablet Commonly known as: ZOLOFT TAKE 1/2 TABLET BY MOUTH DAILY What changed:  how much to take when to take this        Follow-up Information     Lelon Perla, MD Follow up.   Specialty: Cardiology Why:  [facility] - cardiology office will call you to arrange follow-up appointment. Contact information: [address] [place] [address] [phone]         Billie Ruddy, MD. Schedule an appointment as soon as possible for a visit in 1 week(s).   Specialty: Family Medicine Contact information: [place] [address] [phone]                Allergies  Allergen Reactions   Dilaudid [Hydromorphone] Nausea And Vomiting   Prozac [Fluoxetine Hcl]     hallucinations    Consultations: Cardiology, Dr. Harl Bowie   Procedures/Studies: DG Chest 2 View  Result Date: 10/18/2021 CLINICAL DATA:  Chest pain. EXAM: CHEST - 2 VIEW COMPARISON:  08/04/2020. FINDINGS: The heart is mildly enlarged and the mediastinal contour is within normal limits. No consolidation, effusion, or pneumothorax. No acute osseous abnormality. Surgical clips are present in the right upper quadrant. IMPRESSION: Mild cardiomegaly with no acute process. Electronically Signed   By: Brett Fairy M.D.   On: 10/18/2021 01:42   NM Myocar Multi W/Spect Tamela Oddi Motion / EF  Result Date: 10/19/2021   Findings are consistent with prior myocardial infarction. The study is high risk.   No ST deviation was noted.   Left ventricular function is normal. The left ventricular ejection fraction is mildly decreased (45-54%). End diastolic cavity size is moderately enlarged. End systolic cavity size is moderately enlarged.   Prior study available for comparison from 05/26/2020. There are changes compared to prior study which appear to be worse. Findings: There are inferolateral perfusion defects in rest and stress with associated wall motion abnormalities suggestive of prior LCX or RCA infarct. There is a small area of ischemia in the true apex. LVEF is mild diminished with moderate dilation by volume Conclusions: Stress test is suggestive of infarction. High risk study due to degree of  infarction. New infarction form 05/2020 study.  ECHOCARDIOGRAM COMPLETE  Result Date: 09/23/2021    ECHOCARDIOGRAM REPORT   Patient Name:   Sherrice Creekmore Date of Exam: 09/23/2021 Medical Rec #:  [id number]       Height:       66.0 in Accession #:    [id number]      Weight:       213.0 lb Date of Birth:  11-29-1970       BSA:          2.054 m Patient Age:    51 years        BP:  170/86 mmHg Patient Gender: F               HR:           76 bpm. Exam Location:  [address] Procedure: 2D Echo, 3D Echo, Cardiac Doppler, Color Doppler and Strain Analysis Indications:    I25.10 CAD  History:        Patient has prior history of Echocardiogram examinations, most                 recent 08/05/2020. CAD and Previous Myocardial Infarction,                 Signs/Symptoms:Syncope, Shortness of Breath and Edema; Risk                 Factors:Hypertension, Diabetes and Dyslipidemia.  Sonographer:    Deliah Boston RDCS Referring Phys: [place]  1. Left ventricular ejection fraction by 3D volume is 51 %. The left ventricle has low normal function. The left ventricle has no regional wall motion abnormalities. There is mild left ventricular hypertrophy. Left ventricular diastolic parameters are consistent with Grade I diastolic dysfunction (impaired relaxation). The average left ventricular global longitudinal strain is 23.4 %. The global longitudinal strain is normal.  2. Right ventricular systolic function is normal. The right ventricular size is normal.  3. Left atrial size was moderately dilated.  4. A small pericardial effusion is present. The pericardial effusion is circumferential. There is no evidence of cardiac tamponade.  5. The mitral valve is normal in structure. Mild mitral valve regurgitation. No evidence of mitral stenosis.  6. Calcification (small nodule) on left coronary cusp unchanged from 2021. The aortic valve is calcified. There is moderate calcification of the aortic valve. There  is mild thickening of the aortic valve. Aortic valve regurgitation is not visualized. No  aortic stenosis is present.  7. The inferior vena cava is normal in size with greater than 50% respiratory variability, suggesting right atrial pressure of 3 mmHg. Comparison(s): No significant change from prior study. Prior images reviewed side by side. FINDINGS  Left Ventricle: Left ventricular ejection fraction by 3D volume is 51 %. The left ventricle has low normal function. The left ventricle has no regional wall motion abnormalities. The average left ventricular global longitudinal strain is 23.4 %. The global longitudinal strain is normal. The left ventricular internal cavity size was normal in size. There is mild left ventricular hypertrophy. Left ventricular diastolic parameters are consistent with Grade I diastolic dysfunction (impaired relaxation). Right Ventricle: The right ventricular size is normal. No increase in right ventricular wall thickness. Right ventricular systolic function is normal. Left Atrium: Left atrial size was moderately dilated. Right Atrium: Right atrial size was normal in size. Pericardium: A small pericardial effusion is present. The pericardial effusion is circumferential. There is no evidence of cardiac tamponade. Mitral Valve: The mitral valve is normal in structure. Mild mitral valve regurgitation. No evidence of mitral valve stenosis. Tricuspid Valve: The tricuspid valve is normal in structure. Tricuspid valve regurgitation is not demonstrated. No evidence of tricuspid stenosis. Aortic Valve: Calcification (small nodule) on left coronary cusp unchanged from 2021. The aortic valve is calcified. There is moderate calcification of the aortic valve. There is mild thickening of the aortic valve. Aortic valve regurgitation is not visualized. No aortic stenosis is present. Pulmonic Valve: The pulmonic valve was normal in structure. Pulmonic valve regurgitation is not visualized. No evidence of  pulmonic stenosis. Aorta: The aortic root is normal in  size and structure. Venous: The inferior vena cava is normal in size with greater than 50% respiratory variability, suggesting right atrial pressure of 3 mmHg. IAS/Shunts: No atrial level shunt detected by color flow Doppler.  LEFT VENTRICLE PLAX 2D LVIDd:         5.55 cm         Diastology LVIDs:         4.05 cm         LV e' medial:    6.85 cm/s LV PW:         0.90 cm         LV E/e' medial:  14.6 LV IVS:        1.25 cm         LV e' lateral:   9.14 cm/s LVOT diam:     2.20 cm         LV E/e' lateral: 11.0 LV SV:         76 LV SV Index:   37              2D LVOT Area:     3.80 cm        Longitudinal                                Strain                                2D Strain GLS  19.3 %                                (A2C):                                2D Strain GLS  25.6 %                                (A3C):                                2D Strain GLS  25.1 %                                (A4C):                                2D Strain GLS  23.4 %                                Avg:                                 3D Volume EF                                LV 3D EF:    Left  ventricul                                             ar                                             ejection                                             fraction                                             by 3D                                             volume is                                             51 %.                                 3D Volume EF:                                3D EF:        51 %                                LV EDV:       194 ml                                LV ESV:       95 ml                                LV SV:        99 ml LEFT ATRIUM              Index        RIGHT ATRIUM           Index LA diam:        5.00 cm  2.43 cm/m   RA Area:     21.60 cm LA Vol (A2C):   64.6 ml  31.45 ml/m  RA Volume:    59.70 ml  29.06 ml/m LA Vol (A4C):   101.0 ml 49.17 ml/m LA Biplane Vol: 86.1 ml  41.92 ml/m  AORTIC VALVE LVOT Vmax:   86.65 cm/s LVOT Vmean:  59.500 cm/s LVOT VTI:    0.199 m  AORTA Ao Root diam: 3.40  cm Ao Asc diam:  3.40 cm MITRAL VALVE MV Area (PHT): cm          SHUNTS MV Decel Time: 187 msec     Systemic VTI:  0.20 m MV E velocity: 100.35 cm/s  Systemic Diam: 2.20 cm MV A velocity: 93.80 cm/s MV E/A ratio:  1.07 Candee Furbish MD Electronically signed by Candee Furbish MD Signature Date/Time: 09/23/2021/9:45:55 AM    Final      Subjective: Patient seen examined bedside, resting comfortably.  Remains chest pain-free.  Completed Lexiscan stress test, discussed with cardiology, Dr. Harl Bowie and believes findings mostly consistent with old infarct and given her renal dysfunction plan to manage medically with addition of Imdur.  Discussed with patient findings and cardiology recommendations.  Okay for discharge home with outpatient follow-up with cardiology.  Patient denies headache, no dizziness, no current chest pain, no palpitations, no shortness of breath, no abdominal pain, no fever/chills/night sweats, no nausea/vomiting/diarrhea, no weakness, no fatigue, no paresthesias.  No acute events or concerns per nursing staff.  Discharge Exam: Vitals:   10/19/21 1110 10/19/21 1240  BP: (!) 158/89 (!) 145/80  Pulse:  75  Resp:  15  Temp:  97.6 F (36.4 C)  SpO2:  100%   Vitals:   10/19/21 1106 10/19/21 1108 10/19/21 1110 10/19/21 1240  BP: (!) 152/86 (!) 159/92 (!) 158/89 (!) 145/80  Pulse:    75  Resp:    15  Temp:    97.6 F (36.4 C)  TempSrc:    Oral  SpO2:    100%  Weight:      Height:        Physical Exam: GEN: NAD, alert and oriented x 3, obese HEENT: NCAT, PERRL, EOMI, sclera clear, MMM PULM: CTAB w/o wheezes/crackles, normal respiratory effort, on room air CV: RRR w/o M/G/R GI: abd soft, NTND, NABS, no R/G/M MSK: no peripheral edema, muscle strength globally intact 5/5 bilateral  upper/lower extremities NEURO: CN II-XII intact, no focal deficits, sensation to light touch intact PSYCH: normal mood/affect Integumentary: dry/intact, no rashes or wounds    The results of significant diagnostics from this hospitalization (including imaging, microbiology, ancillary and laboratory) are listed below for reference.     Microbiology: Recent Results (from the past 240 hour(s))  Resp Panel by RT-PCR (Flu A&B, Covid) Nasopharyngeal Swab     Status: None   Collection Time: 10/18/21  9:05 AM   Specimen: Nasopharyngeal Swab; Nasopharyngeal(NP) swabs in vial transport medium  Result Value Ref Range Status   SARS Coronavirus 2 by RT PCR NEGATIVE NEGATIVE Final    Comment: (NOTE) SARS-CoV-2 target nucleic acids are NOT DETECTED.  The SARS-CoV-2 RNA is generally detectable in upper respiratory specimens during the acute phase of infection. The lowest concentration of SARS-CoV-2 viral copies this assay can detect is 138 copies/mL. A negative result does not preclude SARS-Cov-2 infection and should not be used as the sole basis for treatment or other patient management decisions. A negative result may occur with  improper specimen collection/handling, submission of specimen other than nasopharyngeal swab, presence of viral mutation(s) within the areas targeted by this assay, and inadequate number of viral copies(<138 copies/mL). A negative result must be combined with clinical observations, patient history, and epidemiological information. The expected result is Negative.  Fact Sheet for Patients:  EntrepreneurPulse.com.au  Fact Sheet for Healthcare Providers:  IncredibleEmployment.be  This test is no t yet approved or cleared by the [place] FDA and  has been authorized for detection  and/or diagnosis of SARS-CoV-2 by FDA under an Emergency Use Authorization (EUA). This EUA will remain  in effect (meaning this test can be used) for  the duration of the COVID-19 declaration under Section 564(b)(1) of the Act, 21 U.S.C.section 360bbb-3(b)(1), unless the authorization is terminated  or revoked sooner.       Influenza A by PCR NEGATIVE NEGATIVE Final   Influenza B by PCR NEGATIVE NEGATIVE Final    Comment: (NOTE) The Xpert Xpress SARS-CoV-2/FLU/RSV plus assay is intended as an aid in the diagnosis of influenza from Nasopharyngeal swab specimens and should not be used as a sole basis for treatment. Nasal washings and aspirates are unacceptable for Xpert Xpress SARS-CoV-2/FLU/RSV testing.  Fact Sheet for Patients: EntrepreneurPulse.com.au  Fact Sheet for Healthcare Providers: IncredibleEmployment.be  This test is not yet approved or cleared by the [place] FDA and has been authorized for detection and/or diagnosis of SARS-CoV-2 by FDA under an Emergency Use Authorization (EUA). This EUA will remain in effect (meaning this test can be used) for the duration of the COVID-19 declaration under Section 564(b)(1) of the Act, 21 U.S.C. section 360bbb-3(b)(1), unless the authorization is terminated or revoked.  Performed at [facility], [place] [address], [place],  [address]      Labs: BNP (last 3 results) No results for input(s): BNP in the last 8760 hours. Basic Metabolic Panel: Recent Labs  Lab 10/18/21 0130 10/19/21 0336  NA 137 133*  K 3.4* 3.7  CL 104 103  CO2 21* 20*  GLUCOSE 240* 209*  BUN 20 24*  CREATININE 2.24* 2.26*  CALCIUM 9.4 8.9   Liver Function Tests: No results for input(s): AST, ALT, ALKPHOS, BILITOT, PROT, ALBUMIN in the last 168 hours. No results for input(s): LIPASE, AMYLASE in the last 168 hours. No results for input(s): AMMONIA in the last 168 hours. CBC: Recent Labs  Lab 10/18/21 0130 10/19/21 0336  WBC 11.8* 11.0*  HGB 10.9* 10.6*  HCT 33.8* 33.5*  MCV 82.0 81.7  PLT 359 341   Cardiac Enzymes: No results for  input(s): CKTOTAL, CKMB, CKMBINDEX, TROPONINI in the last 168 hours. BNP: Invalid input(s): POCBNP CBG: Recent Labs  Lab 10/18/21 1247 10/18/21 1632 10/18/21 2106 10/19/21 0857 10/19/21 1248  GLUCAP 156* 207* 158* 213* 219*   D-Dimer No results for input(s): DDIMER in the last 72 hours. Hgb A1c Recent Labs    10/18/21 1244  HGBA1C 7.9*   Lipid Profile Recent Labs    10/18/21 1244  CHOL 192  HDL 37*  LDLCALC 109*  TRIG 232*  CHOLHDL 5.2   Thyroid function studies Recent Labs    10/18/21 1244  TSH 1.426   Anemia work up No results for input(s): VITAMINB12, FOLATE, FERRITIN, TIBC, IRON, RETICCTPCT in the last 72 hours. Urinalysis    Component Value Date/Time   COLORURINE YELLOW 02/23/2018 1601   APPEARANCEUR Sl Cloudy (A) 02/23/2018 1601   LABSPEC >=1.030 (A) 02/23/2018 1601   PHURINE 5.5 02/23/2018 1601   GLUCOSEU 100 (A) 02/23/2018 1601   HGBUR NEGATIVE 02/23/2018 1601   BILIRUBINUR NEGATIVE 02/23/2018 1601   KETONESUR NEGATIVE 02/23/2018 1601   PROTEINUR 100 (A) 10/16/2017 1043   UROBILINOGEN 0.2 02/23/2018 1601   NITRITE NEGATIVE 02/23/2018 1601   LEUKOCYTESUR NEGATIVE 02/23/2018 1601   Sepsis Labs Invalid input(s): PROCALCITONIN,  WBC,  LACTICIDVEN Microbiology Recent Results (from the past 240 hour(s))  Resp Panel by RT-PCR (Flu A&B, Covid) Nasopharyngeal Swab     Status: None   Collection  Time: 10/18/21  9:05 AM   Specimen: Nasopharyngeal Swab; Nasopharyngeal(NP) swabs in vial transport medium  Result Value Ref Range Status   SARS Coronavirus 2 by RT PCR NEGATIVE NEGATIVE Final    Comment: (NOTE) SARS-CoV-2 target nucleic acids are NOT DETECTED.  The SARS-CoV-2 RNA is generally detectable in upper respiratory specimens during the acute phase of infection. The lowest concentration of SARS-CoV-2 viral copies this assay can detect is 138 copies/mL. A negative result does not preclude SARS-Cov-2 infection and should not be used as the sole basis  for treatment or other patient management decisions. A negative result may occur with  improper specimen collection/handling, submission of specimen other than nasopharyngeal swab, presence of viral mutation(s) within the areas targeted by this assay, and inadequate number of viral copies(<138 copies/mL). A negative result must be combined with clinical observations, patient history, and epidemiological information. The expected result is Negative.  Fact Sheet for Patients:  EntrepreneurPulse.com.au  Fact Sheet for Healthcare Providers:  IncredibleEmployment.be  This test is no t yet approved or cleared by the [place] FDA and  has been authorized for detection and/or diagnosis of SARS-CoV-2 by FDA under an Emergency Use Authorization (EUA). This EUA will remain  in effect (meaning this test can be used) for the duration of the COVID-19 declaration under Section 564(b)(1) of the Act, 21 U.S.C.section 360bbb-3(b)(1), unless the authorization is terminated  or revoked sooner.       Influenza A by PCR NEGATIVE NEGATIVE Final   Influenza B by PCR NEGATIVE NEGATIVE Final    Comment: (NOTE) The Xpert Xpress SARS-CoV-2/FLU/RSV plus assay is intended as an aid in the diagnosis of influenza from Nasopharyngeal swab specimens and should not be used as a sole basis for treatment. Nasal washings and aspirates are unacceptable for Xpert Xpress SARS-CoV-2/FLU/RSV testing.  Fact Sheet for Patients: EntrepreneurPulse.com.au  Fact Sheet for Healthcare Providers: IncredibleEmployment.be  This test is not yet approved or cleared by the [place] FDA and has been authorized for detection and/or diagnosis of SARS-CoV-2 by FDA under an Emergency Use Authorization (EUA). This EUA will remain in effect (meaning this test can be used) for the duration of the COVID-19 declaration under Section 564(b)(1) of the Act, 21  U.S.C. section 360bbb-3(b)(1), unless the authorization is terminated or revoked.  Performed at [facility], [place] [address], [place], [place] [address]      Time coordinating discharge: Over 30 minutes  SIGNED:   Donnamarie Poag [place] DO  [facility] 10/19/2021, 2:16 PM

## 2021-10-19 NOTE — Assessment & Plan Note (Signed)
Continue amlodipine 10 mg p.o. daily, carvedilol 25 mg p.o. twice daily, clonidine patch 0.2 mg weekly, hydralazine 100 mg p.o. 3 times daily, started on Imdur 15 mg p.o. daily by cardiology.  Outpatient follow-up with cardiology and PCP.

## 2021-10-19 NOTE — Progress Notes (Signed)
° °  Casey Acosta presented for a nuclear stress test today.  No immediate complications.  Stress imaging is pending at this time.  Preliminary EKG findings may be listed in the chart, but the stress test result will not be finalized until perfusion imaging is complete.  Charlie Pitter, PA-C 10/19/2021, 11:12 AM

## 2021-10-19 NOTE — Assessment & Plan Note (Signed)
Hemoglobin A1c 7.9, not optimally controlled.  Continue home glimepiride 1 mg p.o. daily.  Outpatient follow-up PCP.

## 2021-10-19 NOTE — Progress Notes (Signed)
Dr. Harl Bowie reviewed nuc and will be relaying recommendations to primary team shortly. Per his request I have sent a message to our office's scheduling team requesting a follow-up appointment, and our office will call the patient with this information.

## 2021-10-19 NOTE — TOC Transition Note (Signed)
Transition of Care [facility]) - CM/SW Discharge Note   Patient Details  Name: Casey Acosta MRN: [id number] Date of Birth: [birth year]  Transition of Care [facility]) CM/SW Contact:  Konrad Penta, RN Phone Number: [phone] 10/19/2021, 3:04 PM   Clinical Narrative:  [name] as if patient does not have insurance in chart. Spoke with [name] to confirm and to discuss TOC needs. [name] reports that she does have insurance and is able to get her prescriptions filled. She has PCP and Cardiology follow up post hospitalization.  No TOC needs identified.    Final next level of care: Home/Self Care Barriers to Discharge: No Barriers Identified   Patient Goals and CMS Choice Patient states their goals for this hospitalization and ongoing recovery are:: return home      Discharge Placement                       Discharge Plan and Services                                     Social Determinants of Health (SDOH) Interventions     Readmission Risk Interventions No flowsheet data found.

## 2021-10-19 NOTE — Progress Notes (Addendum)
Progress Note  Patient Name: Casey Acosta Date of Encounter: 10/19/2021  [facility] Cardiologist: Kirk Ruths, MD   Subjective   No complaints  Inpatient Medications    Scheduled Meds:  amLODipine  10 mg Oral Daily   aspirin EC  81 mg Oral Daily   atorvastatin  80 mg Oral QHS   carvedilol  25 mg Oral BID WC   cloNIDine  0.2 mg Transdermal Q Sat   COVID-19 mRNA bivalent vaccine (Pfizer)  0.3 mL Intramuscular ONCE-1600   enoxaparin (LOVENOX) injection  40 mg Subcutaneous Daily   hydrALAZINE  100 mg Oral TID   influenza vac split quadrivalent PF  0.5 mL Intramuscular Tomorrow-1000   insulin aspart  0-15 Units Subcutaneous TID WC   insulin aspart  0-5 Units Subcutaneous QHS   pneumococcal 20-valent conjugate vaccine  0.5 mL Intramuscular Tomorrow-1000   sertraline  100 mg Oral QHS   sodium chloride flush  3 mL Intravenous Q12H   Continuous Infusions:  sodium chloride     PRN Meds: sodium chloride, acetaminophen, cyclobenzaprine, hydrOXYzine, nitroGLYCERIN, ondansetron (ZOFRAN) IV, sodium chloride flush   Vital Signs    Vitals:   10/18/21 1726 10/18/21 2111 10/19/21 0051 10/19/21 0456  BP: (!) 145/83 (!) 141/85 137/77 138/74  Pulse:   73 75  Resp:      Temp:   97.9 F (36.6 C) 98 F (36.7 C)  TempSrc:   Oral Oral  SpO2:   96% 95%  Weight:      Height:        Intake/Output Summary (Last 24 hours) at 10/19/2021 0109 Last data filed at 10/18/2021 2049 Gross per 24 hour  Intake 120 ml  Output --  Net 120 ml   Last 3 Weights 10/18/2021 09/17/2021 06/09/2021  Weight (lbs) 202 lb 14.4 oz 213 lb 201 lb  Weight (kg) 92.035 kg 96.616 kg 91.173 kg      Telemetry    SR - Personally Reviewed  ECG    SR, chronic lateral ST/T changes mild - Personally Reviewed  Physical Exam   GEN: No acute distress.   Neck: No JVD Cardiac: RRR, no murmurs, rubs, or gallops.  Respiratory: Clear to auscultation bilaterally. GI: Soft, nontender, non-distended  MS: No  edema; No deformity. Neuro:  Nonfocal  Psych: Normal affect   Labs    High Sensitivity Troponin:   Recent Labs  Lab 10/18/21 0130 10/18/21 0418 10/18/21 1634 10/19/21 0336  TROPONINIHS 23* 21* 17 13     Chemistry Recent Labs  Lab 10/18/21 0130 10/19/21 0336  NA 137 133*  K 3.4* 3.7  CL 104 103  CO2 21* 20*  GLUCOSE 240* 209*  BUN 20 24*  CREATININE 2.24* 2.26*  CALCIUM 9.4 8.9  GFRNONAA 26* 25*  ANIONGAP 12 10    Lipids  Recent Labs  Lab 10/18/21 1244  CHOL 192  TRIG 232*  HDL 37*  LDLCALC 109*  CHOLHDL 5.2    Hematology Recent Labs  Lab 10/18/21 0130 10/19/21 0336  WBC 11.8* 11.0*  RBC 4.12 4.10  HGB 10.9* 10.6*  HCT 33.8* 33.5*  MCV 82.0 81.7  MCH 26.5 25.9*  MCHC 32.2 31.6  RDW 15.4 15.0  PLT 359 341   Thyroid  Recent Labs  Lab 10/18/21 1244  TSH 1.426    BNPNo results for input(s): BNP, PROBNP in the last 168 hours.  DDimer No results for input(s): DDIMER in the last 168 hours.   Radiology    DG Chest  2 View  Result Date: 10/18/2021 CLINICAL DATA:  Chest pain. EXAM: CHEST - 2 VIEW COMPARISON:  08/04/2020. FINDINGS: The heart is mildly enlarged and the mediastinal contour is within normal limits. No consolidation, effusion, or pneumothorax. No acute osseous abnormality. Surgical clips are present in the right upper quadrant. IMPRESSION: Mild cardiomegaly with no acute process. Electronically Signed   By: Brett Fairy M.D.   On: 10/18/2021 01:42    Cardiac Studies     Patient Profile     Casey Acosta is a 51 y.o. female with a hx of CAD with prior MI and stent to LCX who is being seen 10/18/2021 for the evaluation of chest pain at the request of Dr Lorin Mercy.  Assessment & Plan    1.CAD - history of MI in 2021 with stent to LCX - presents with chest pain. Mild flat trop thus far not consistent with ACS. Chronic lateral ST/T chagnes more prominent on presentation - very mild trop on admission has resolved -  Recent echo would not  repeat. - In general with renal dysfunction higher threshold for cath.  - if further elevation of troponin or recurrent pain could start hep gtt - continue medical therapy with ASA 81, atorva 80, coreg 25mg  bid  - plan for lexiscan this AM, if low or perhaps even if intermediate risk would manage just medically due to significant kidney dysfunction. Could imdur at discharge.    Addendum 147 pm Stress shows primarily old damage from prior infarct, mild apical ischemia. High risk based on prior infarct but from management standpoint given just mild current ischemia management would be just medical therapy as opposed to cath, particularly given her renal function. Added imdur 15mg  daily, ok for discharge.   For questions or updates, please contact [place] Please consult [email or web address] for contact info under        Signed, Carlyle Dolly, MD  10/19/2021, 7:12 AM

## 2021-10-19 NOTE — Hospital Course (Signed)
Casey Acosta is a 51 year old female with past medical history significant for anxiety/depression, essential hypertension, hyperlipidemia, type 2 diabetes mellitus, chronic diastolic congestive heart failure, CAD/STEMI s/p PCI and stents 07/2020, obesity who presents to [facility] ED on 2/18 with complaints of chest pain.  Patient reports onset during sleep, localized to the left chest.  Associated with mild shortness of breath.  She reportedly took 3 nitroglycerin tablets without improvement.  EMS was called and was given 4 aspirin without relief.  She was also given 1 more nitroglycerin with now reported improvement in chest pain.  In the ED, temperature 98.2 F, HR 93, RR 16, BP 149/89, SPO2 94% on room air.  Sodium 137, potassium 3.4, chloride 104, CO2 21, glucose 240, BUN 20, creatinine 2.40.  WBC 11.8, hemoglobin 10.9, platelets 359.  hCG negative.  UDS positive for THC.  Chest x-ray with mild cardiomegaly, no acute cardiopulmonary disease process.  EKG with normal sinus rhythm, T wave inversions lead I/aVL; which is similar in appearance to EKG dated 02/17/2021.  Cardiology was consulted.  Hospital service consulted for further evaluation and management of chest pain.

## 2021-10-20 ENCOUNTER — Ambulatory Visit (HOSPITAL_COMMUNITY): Payer: Self-pay | Attending: Cardiology

## 2021-10-22 ENCOUNTER — Other Ambulatory Visit: Payer: Self-pay | Admitting: Family Medicine

## 2021-11-03 ENCOUNTER — Ambulatory Visit: Payer: Self-pay | Admitting: Nurse Practitioner

## 2021-11-03 NOTE — Progress Notes (Unsigned)
Office Visit    Patient Name: Casey Acosta Date of Encounter: 11/03/2021  Primary Care Provider:  Billie Ruddy, MD Primary Cardiologist:  Kirk Ruths, MD  Chief Complaint    51 year old female with a history of CAD s/p NSTEMI, DESx2 overlapping-mLCx in 2021, hypertension, hyperlipidemia, aortic valve mass, type 2 diabetes, CKD stage IV, anxiety and depression who presents for hospital follow-up related to chest pain and CAD.  Past Medical History    Past Medical History:  Diagnosis Date   Anemia    Anxiety and depression    Chronic kidney disease    Colon polyp    Diabetes mellitus without complication (HCC)    High risk sexual behavior    History of syncope    Hyperlipidemia    Hypertension    Internal hemorrhoids    LVH (left ventricular hypertrophy)    Obesity (BMI 30-39.9)    Ovarian mass    Panic disorder    PCOS (polycystic ovarian syndrome)    STEMI (ST elevation myocardial infarction) ([place]) 07/2020   Surgical menopause    Vaginal trichomoniasis    Past Surgical History:  Procedure Laterality Date   ABDOMINAL HYSTERECTOMY     partial   APPENDECTOMY     CHOLECYSTECTOMY     CORONARY/GRAFT ACUTE MI REVASCULARIZATION N/A 08/04/2020   Procedure: Coronary/Graft Acute MI Revascularization;  Surgeon: Martinique, Peter M, MD;  Location: [facility];  Service: Cardiovascular;  Laterality: N/A;   LEFT HEART CATH AND CORONARY ANGIOGRAPHY N/A 08/04/2020   Procedure: LEFT HEART CATH AND CORONARY ANGIOGRAPHY;  Surgeon: Martinique, Peter M, MD;  Location: [facility];  Service: Cardiovascular;  Laterality: N/A;   TEE WITHOUT CARDIOVERSION N/A 05/27/2020   Procedure: TRANSESOPHAGEAL ECHOCARDIOGRAM (TEE);  Surgeon: Josue Hector, MD;  Location: [facility];  Service: Cardiovascular;  Laterality: N/A;   TUBAL LIGATION      Allergies  Allergies  Allergen Reactions   Dilaudid [Hydromorphone] Nausea And Vomiting   Prozac [Fluoxetine Hcl]     hallucinations     History of Present Illness    51 year old female with the above past medical history including CAD s/p NSTEMI, DESx2 overlapping-mLCx in 2021, hypertension, hyperlipidemia, aortic valve mass, type 2 diabetes, CKD stage IV, anxiety and depression.  Echocardiogram in 2021 showed EF 60 to 65%, 2 DD, and a small mass attached to the left coronary cusp of the aortic valve.  Follow-up TEE showed nodular thickening of the edge of the left coronary cusp, no independent motion, not likely to be fibroelastoma, consider clinically insignificant, ASA 81 mg was recommended.  Stress test at the time was negative for ischemia.  She was hospitalized in December 2021 in the setting of STEMI.  Cardiac catheterization showed pLAD 60%, mLAD 50%, D1 80%, RI 50%, m-dCx 100-0% s/p DESx2 overlapping, p-mRCA 80%, dRCA 50%, RPDA 40%, LPAV 80%, 3rd LPL 90%, and 1st LPL 50%.  Essentially diffuse disease diabetic coronary arteries, management was recommended for residual disease.  It was stated that should she have refractory angina in the future, she would need to be considered for CABG.  Echo at the time was unchanged from prior.  She was last seen in the office on 09/17/2021 and was stable overall from a cardiac standpoint.  She did have new onset lower extremity edema. Repeat echocardiogram was essentially unchanged from prior and showed an EF of 51%, low normal LV function, no RWMA, mild LVH, G1 DD, moderate LAE, small pericardial effusion without  evidence of tamponade, small nodule on left coronary cusp of aortic valve, unchanged from 2021, moderate calcification of aortic valve, no evidence of aortic stenosis. Unfortunately, she presented to the ED on 10/18/2021 with complaints of left chest pressure, relieved with nitroglycerin.  The pain lasted for approximately 1 hour and was similar to her prior anginal equivalent.  Chest x-ray was unremarkable.  Troponin was mildly elevated with flat trend (23 > 21).  EKG showed NSR,  LVH, more prominent lateral TWI.  Cardiology was consulted and patient underwent Lexiscan Myoview on 10/19/2021 showed inferior lateral perfusion defects at rest and stress consistent with prior LCx or RCA infarct as well a small area of ischemia in the true apex.  This was thought to be consistent with old damage from prior MI and very mild area of potentially new blockage in the apex considered low risk, medical management recommended.  She was started on Imdur 15 milligrams daily and discharged home in stable condition.  She presents today for follow-up.  Since her ED visit  CAD/chest pain: S/p STEMI, DESx 2 overlapping to m-dLCx, medical management was recommended for residual disease.  Recent ED visit for chest pain. Lexiscan Myoview on 10/19/2021 showed inferior lateral perfusion defects at rest and stress consistent with prior LCx or RCA infarct as well a small area of ischemia in the true apex, consistent with old damage from prior MI and very mild area of potentially new blockage in the apex considered low risk, medical management recommended.  She was started on Imdur.  Grade 1 diastolic dysfunction: Most recent echo in January 2023 was essentially unchanged from prior and showed an EF of 51%, low normal LV function, no RWMA, mild LVH, G1 DD, moderate LAE, small pericardial effusion without evidence of tamponade, small nodule on left coronary cusp of aortic valve, unchanged from 2021, moderate calcification of aortic valve, no evidence of aortic stenosis. Euvolemic and well compensated on exam.  Aortic valve mass: Recent echo as above.  Continue aspirin. Hypertension: BP well controlled. Continue current antihypertensive regimen.   Hyperlipidemia: LDL 109 on 10/18/2021. Repeat lipids.  CKD stage IV: Creatinine 2.26 on 10/19/2021 Type 2 diabetes: A1c 7.9 on 10/18/2021. Disposition:  Home Medications    Current Outpatient Medications  Medication Sig Dispense Refill   amLODipine (NORVASC) 10  MG tablet Take 1 tablet (10 mg total) by mouth daily. 90 tablet 3   aspirin EC 81 MG tablet Take 81 mg by mouth daily.     aspirin-acetaminophen-caffeine (EXCEDRIN MIGRAINE) 250-250-65 MG tablet Take 2 tablets by mouth every 6 (six) hours as needed for headache or migraine.     atorvastatin (LIPITOR) 80 MG tablet Take 1 tablet (80 mg total) by mouth daily. (Patient taking differently: Take 80 mg by mouth at bedtime.) 90 tablet 3   blood glucose meter kit and supplies KIT Dispense based on patient and insurance preference. Use up to four times daily as directed. 1 each 0   carvedilol (COREG) 25 MG tablet TAKE ONE TABLET BY MOUTH TWICE A DAY WITH A MEAL (Patient taking differently: Take 25 mg by mouth 2 (two) times daily with a meal.) 180 tablet 2   cloNIDine (CATAPRES - DOSED IN MG/24 HR) 0.2 mg/24hr patch Place 1 patch (0.2 mg total) onto the skin once a week. (Patient taking differently: Place 0.2 mg onto the skin every Saturday.) 12 patch 3   cyclobenzaprine (FLEXERIL) 5 MG tablet Take 1 tablet (5 mg total) by mouth at bedtime. (Patient taking differently:  Take 5 mg by mouth 3 (three) times daily as needed for muscle spasms.) 30 tablet 1   glimepiride (AMARYL) 1 MG tablet TAKE ONE TABLET BY MOUTH DAILY WITH BREAKFAST. (Patient taking differently: Take 1 mg by mouth daily with breakfast.) 90 tablet 1   glucose blood test strip One-step Ultra meter.  Use as instructed 100 each 12   hydrALAZINE (APRESOLINE) 100 MG tablet Take 1 tablet by mouth 4 times daily. (Patient taking differently: Take 100 mg by mouth 3 (three) times daily.) 360 tablet 1   hydrOXYzine (ATARAX/VISTARIL) 50 MG tablet Take 1 tablet (50 mg total) by mouth every 6 (six) hours as needed for anxiety. 60 tablet 3   isosorbide mononitrate (IMDUR) 30 MG 24 hr tablet Take 0.5 tablets (15 mg total) by mouth daily. 15 tablet 2   loratadine (CLARITIN) 10 MG tablet Take 10 mg by mouth daily as needed for allergies.     Multiple Vitamin  (MULTIVITAMIN PO) Take 1 tablet by mouth daily.     Multiple Vitamins-Minerals (HAIR SKIN AND NAILS FORMULA PO) Take 1 tablet by mouth daily.     nitroGLYCERIN (NITROSTAT) 0.4 MG SL tablet Place 1 tablet (0.4 mg total) under the tongue every 5 (five) minutes as needed. (Patient taking differently: Place 0.4 mg under the tongue every 5 (five) minutes as needed for chest pain.) 25 tablet 2   sertraline (ZOLOFT) 100 MG tablet TAKE 1/2 TABLET BY MOUTH DAILY (Patient taking differently: Take 100 mg by mouth at bedtime.) 45 tablet 0   No current facility-administered medications for this visit.     Review of Systems    ***.  All other systems reviewed and are otherwise negative except as noted above.    Physical Exam    VS:  There were no vitals taken for this visit. , BMI There is no height or weight on file to calculate BMI.     GEN: Well nourished, well developed, in no acute distress. HEENT: normal. Neck: Supple, no JVD, carotid bruits, or masses. Cardiac: RRR, no murmurs, rubs, or gallops. No clubbing, cyanosis, edema.  Radials/DP/PT 2+ and equal bilaterally.  Respiratory:  Respirations regular and unlabored, clear to auscultation bilaterally. GI: Soft, nontender, nondistended, BS + x 4. MS: no deformity or atrophy. Skin: warm and dry, no rash. Neuro:  Strength and sensation are intact. Psych: Normal affect.  Accessory Clinical Findings    ECG personally reviewed by me today - *** - no acute changes.  Lab Results  Component Value Date   WBC 11.0 (H) 10/19/2021   HGB 10.6 (L) 10/19/2021   HCT 33.5 (L) 10/19/2021   MCV 81.7 10/19/2021   PLT 341 10/19/2021   Lab Results  Component Value Date   CREATININE 2.26 (H) 10/19/2021   BUN 24 (H) 10/19/2021   NA 133 (L) 10/19/2021   K 3.7 10/19/2021   CL 103 10/19/2021   CO2 20 (L) 10/19/2021   Lab Results  Component Value Date   ALT 22 09/23/2021   AST 21 09/23/2021   ALKPHOS 51 09/23/2021   BILITOT 0.4 09/23/2021   Lab  Results  Component Value Date   CHOL 192 10/18/2021   HDL 37 (L) 10/18/2021   LDLCALC 109 (H) 10/18/2021   LDLDIRECT 206.7 (H) 08/04/2020   TRIG 232 (H) 10/18/2021   CHOLHDL 5.2 10/18/2021    Lab Results  Component Value Date   HGBA1C 7.9 (H) 10/18/2021    Assessment & Plan    1.  ***  Lenna Sciara, NP 11/03/2021, 5:04 AM

## 2021-11-12 ENCOUNTER — Encounter: Payer: Self-pay | Admitting: Nurse Practitioner

## 2021-11-21 ENCOUNTER — Ambulatory Visit: Payer: 59 | Admitting: Cardiology

## 2021-11-24 ENCOUNTER — Encounter: Payer: Self-pay | Admitting: Family Medicine

## 2021-11-24 DIAGNOSIS — E1122 Type 2 diabetes mellitus with diabetic chronic kidney disease: Secondary | ICD-10-CM

## 2021-11-24 MED ORDER — GLIMEPIRIDE 1 MG PO TABS: 1.0000 mg | ORAL_TABLET | Freq: Every day | ORAL | 1 refills | Status: DC

## 2021-11-27 ENCOUNTER — Other Ambulatory Visit: Payer: Self-pay | Admitting: Family Medicine

## 2022-01-04 ENCOUNTER — Other Ambulatory Visit: Payer: Self-pay | Admitting: Family Medicine

## 2022-01-04 DIAGNOSIS — I10 Essential (primary) hypertension: Secondary | ICD-10-CM

## 2022-01-13 ENCOUNTER — Other Ambulatory Visit: Payer: Self-pay | Admitting: Family Medicine

## 2022-01-13 DIAGNOSIS — F419 Anxiety disorder, unspecified: Secondary | ICD-10-CM

## 2022-01-13 DIAGNOSIS — I10 Essential (primary) hypertension: Secondary | ICD-10-CM

## 2022-01-14 MED ORDER — HYDRALAZINE HCL 100 MG PO TABS: 100.0000 mg | ORAL_TABLET | Freq: Three times a day (TID) | ORAL | 0 refills | Status: DC

## 2022-01-14 MED ORDER — SERTRALINE HCL 100 MG PO TABS: 50.0000 mg | ORAL_TABLET | Freq: Every day | ORAL | 0 refills | Status: DC

## 2022-01-16 ENCOUNTER — Other Ambulatory Visit: Payer: Self-pay | Admitting: Family Medicine

## 2022-01-16 DIAGNOSIS — F32A Depression, unspecified: Secondary | ICD-10-CM

## 2022-02-10 ENCOUNTER — Other Ambulatory Visit: Payer: Self-pay | Admitting: Family Medicine

## 2022-02-10 DIAGNOSIS — I10 Essential (primary) hypertension: Secondary | ICD-10-CM

## 2022-03-04 ENCOUNTER — Encounter: Payer: Self-pay | Admitting: Family Medicine

## 2022-03-04 MED ORDER — ISOSORBIDE MONONITRATE ER 30 MG PO TB24: 15.0000 mg | ORAL_TABLET | Freq: Every day | ORAL | 2 refills | Status: DC

## 2022-04-11 ENCOUNTER — Other Ambulatory Visit: Payer: Self-pay | Admitting: Family Medicine

## 2022-05-22 ENCOUNTER — Other Ambulatory Visit: Payer: Self-pay | Admitting: Family Medicine

## 2022-05-22 DIAGNOSIS — I10 Essential (primary) hypertension: Secondary | ICD-10-CM

## 2022-05-26 ENCOUNTER — Other Ambulatory Visit: Payer: Self-pay | Admitting: Family Medicine

## 2022-05-26 DIAGNOSIS — N184 Chronic kidney disease, stage 4 (severe): Secondary | ICD-10-CM

## 2022-06-04 NOTE — Progress Notes (Signed)
HPI: Follow-up coronary artery disease. Transesophageal echocardiogram September 2021 showed normal LV function, nodular thickening of the aortic valve. Patient suffered an inferolateral myocardial infarction December 2021. Cardiac catheterization revealed 60% proximal LAD, 50% mid LAD, 80% first diagonal, 50% ramus, occluded circumflex, 80% proximal to mid right coronary artery, 80% LPAV, 90% third posterior lateral. Patient had PCI of left circumflex at that time.  Also has stage IV kidney disease. Echocardiogram January 2023 showed low normal LV function, mild left ventricular hypertrophy, grade 1 diastolic dysfunction, moderate left atrial enlargement, small pericardial effusion, mild mitral regurgitation, calcification on the aortic valve unchanged.  Nuclear study February 2023 showed prior myocardial infarction and small area of ischemia at the apex; study interpreted as high risk.  Since last seen she ran out of all of her medications 1 week ago.  Her blood pressure has been running high.  She does note increased dyspnea on exertion since running out of her medicines.  No orthopnea or PND.  Mild pedal edema.  She denies chest pain but has had some shoulder discomfort since running out of her medications.  No syncope.  Current Outpatient Medications  Medication Sig Dispense Refill   amLODipine (NORVASC) 10 MG tablet Take 1 tablet (10 mg total) by mouth daily. 90 tablet 3   aspirin EC 81 MG tablet Take 81 mg by mouth daily.     aspirin-acetaminophen-caffeine (EXCEDRIN MIGRAINE) 250-250-65 MG tablet Take 2 tablets by mouth every 6 (six) hours as needed for headache or migraine.     atorvastatin (LIPITOR) 80 MG tablet Take 1 tablet (80 mg total) by mouth daily. (Patient taking differently: Take 80 mg by mouth at bedtime.) 90 tablet 3   carvedilol (COREG) 25 MG tablet TAKE ONE TABLET BY MOUTH TWO TIMES A DAY **SCHEDULE APPOINTMENT FOR REFILLS** 180 tablet 0   cloNIDine (CATAPRES - DOSED IN MG/24  HR) 0.2 mg/24hr patch Place 1 patch (0.2 mg total) onto the skin once a week. (Patient taking differently: Place 0.2 mg onto the skin every Saturday.) 12 patch 3   cyclobenzaprine (FLEXERIL) 5 MG tablet Take 1 tablet (5 mg total) by mouth at bedtime. (Patient taking differently: Take 5 mg by mouth 3 (three) times daily as needed for muscle spasms.) 30 tablet 1   glimepiride (AMARYL) 1 MG tablet TAKE ONE TABLET BY MOUTH DAILY WITH BREAKFAST 90 tablet 0   hydrALAZINE (APRESOLINE) 100 MG tablet TAKE ONE TABLET BY MOUTH THREE TIMES A DAY 270 tablet 0   hydrOXYzine (ATARAX/VISTARIL) 50 MG tablet Take 1 tablet (50 mg total) by mouth every 6 (six) hours as needed for anxiety. 60 tablet 3   isosorbide mononitrate (IMDUR) 30 MG 24 hr tablet Take 0.5 tablets (15 mg total) by mouth daily. 15 tablet 1   loratadine (CLARITIN) 10 MG tablet Take 10 mg by mouth daily as needed for allergies.     Multiple Vitamin (MULTIVITAMIN PO) Take 1 tablet by mouth daily.     nitroGLYCERIN (NITROSTAT) 0.4 MG SL tablet Place 1 tablet (0.4 mg total) under the tongue every 5 (five) minutes as needed. (Patient taking differently: Place 0.4 mg under the tongue every 5 (five) minutes as needed for chest pain.) 25 tablet 2   sertraline (ZOLOFT) 100 MG tablet TAKE ONE-HALF (0.5) TABLET BY MOUTH DAILY 45 tablet 0   blood glucose meter kit and supplies KIT Dispense based on patient and insurance preference. Use up to four times daily as directed. 1 each 0   glucose  blood test strip One-step Ultra meter.  Use as instructed 100 each 12   Multiple Vitamins-Minerals (HAIR SKIN AND NAILS FORMULA PO) Take 1 tablet by mouth daily.     No current facility-administered medications for this visit.     Past Medical History:  Diagnosis Date   Anemia    Anxiety and depression    Chronic kidney disease    Colon polyp    Diabetes mellitus without complication (HCC)    High risk sexual behavior    History of syncope    Hyperlipidemia     Hypertension    Internal hemorrhoids    LVH (left ventricular hypertrophy)    Obesity (BMI 30-39.9)    Ovarian mass    Panic disorder    PCOS (polycystic ovarian syndrome)    STEMI (ST elevation myocardial infarction) ([facility]) 07/2020   Surgical menopause    Vaginal trichomoniasis     Past Surgical History:  Procedure Laterality Date   ABDOMINAL HYSTERECTOMY     partial   APPENDECTOMY     CHOLECYSTECTOMY     CORONARY/GRAFT ACUTE MI REVASCULARIZATION N/A 08/04/2020   Procedure: Coronary/Graft Acute MI Revascularization;  Surgeon: Martinique, Peter M, MD;  Location: [facility];  Service: Cardiovascular;  Laterality: N/A;   LEFT HEART CATH AND CORONARY ANGIOGRAPHY N/A 08/04/2020   Procedure: LEFT HEART CATH AND CORONARY ANGIOGRAPHY;  Surgeon: Martinique, Peter M, MD;  Location: [facility];  Service: Cardiovascular;  Laterality: N/A;   TEE WITHOUT CARDIOVERSION N/A 05/27/2020   Procedure: TRANSESOPHAGEAL ECHOCARDIOGRAM (TEE);  Surgeon: Josue Hector, MD;  Location: [facility];  Service: Cardiovascular;  Laterality: N/A;   TUBAL LIGATION      Social History   Socioeconomic History   Marital status: Married    Spouse name: Not on file   Number of children: Not on file   Years of education: Not on file   Highest education level: Not on file  Occupational History   Occupation: nurse  Tobacco Use   Smoking status: Never   Smokeless tobacco: Never  Vaping Use   Vaping Use: Never used  Substance and Sexual Activity   Alcohol use: Not Currently    Comment: Occas. wine   Drug use: No   Sexual activity: Yes    Birth control/protection: Surgical    Comment: HYST.-1st intercourse 51 yo-More than 5 partners  Other Topics Concern   Not on file  Social History Narrative   Not on file   Social Determinants of Health   Financial Resource Strain: Not on file  Food Insecurity: Not on file  Transportation Needs: Not on file  Physical Activity: Not on file  Stress: Not on  file  Social Connections: Not on file  Intimate Partner Violence: Not on file    Family History  Problem Relation Age of Onset   Breast cancer Mother    Anxiety disorder Mother    Hyperlipidemia Mother    Depression Mother    Hypertension Mother    Diabetes Mother    Thyroid disease Mother    ALS Father    Hypertension Sister    Diabetes Sister    Crohn's disease Sister    Breast cancer Maternal Aunt    Breast cancer Maternal Aunt    Crohn's disease Brother     ROS: no fevers or chills, productive cough, hemoptysis, dysphasia, odynophagia, melena, hematochezia, dysuria, hematuria, rash, seizure activity, orthopnea, PND, claudication. Remaining systems are negative.  Physical Exam: Well-developed well-nourished in  no acute distress.  Skin is warm and dry.  HEENT is normal.  Neck is supple.  Chest is clear to auscultation with normal expansion.  Cardiovascular exam is regular rate and rhythm.  Abdominal exam nontender or distended. No masses palpated. Extremities show trace edema. neuro grossly intact  A/P  1 coronary artery disease-no recent chest pain.  Most recent nuclear study showed small area of ischemia.  LV function is low normal on echocardiogram.  Plan medical therapy.  Continue aspirin and resume statin.  2 hypertension-blood pressure is elevated but she has not taken her medications in the past 1 week.  We will resume all of her previous medications.  She also has noticed pedal edema.  We will begin Lasix 20 mg daily.  In 1 week check potassium and renal function.  Follow blood pressure and adjust regimen as needed.  3 hyperlipidemia-resume Lipitor 80 mg daily.  In 8 weeks check lipids and liver.  Goal LDL less than 55.  4 aortic valve calcification-nodular thickening noted on previous transesophageal echocardiogram.  5 chronic stage IV kidney disease-we will check potassium and renal function 1 week after initiating Lasix 20 mg daily.  6 lower extremity  edema-begin Lasix as outlined.  Patient has moved to [place].  I have asked her to establish with a primary care physician and cardiologist there.  Kirk Ruths, MD

## 2022-06-08 ENCOUNTER — Other Ambulatory Visit: Payer: Self-pay

## 2022-06-13 ENCOUNTER — Other Ambulatory Visit: Payer: Self-pay | Admitting: Cardiology

## 2022-06-13 ENCOUNTER — Other Ambulatory Visit: Payer: Self-pay | Admitting: Family Medicine

## 2022-06-13 DIAGNOSIS — I10 Essential (primary) hypertension: Secondary | ICD-10-CM

## 2022-06-13 DIAGNOSIS — F419 Anxiety disorder, unspecified: Secondary | ICD-10-CM

## 2022-06-15 MED ORDER — ISOSORBIDE MONONITRATE ER 30 MG PO TB24: 15.0000 mg | ORAL_TABLET | Freq: Every day | ORAL | 1 refills | Status: DC

## 2022-06-18 ENCOUNTER — Encounter: Payer: Self-pay | Admitting: Cardiology

## 2022-06-18 ENCOUNTER — Ambulatory Visit: Payer: Self-pay | Attending: Cardiology | Admitting: Cardiology

## 2022-06-18 VITALS — BP 182/98 | HR 105 | Wt 214.2 lb

## 2022-06-18 DIAGNOSIS — R072 Precordial pain: Secondary | ICD-10-CM

## 2022-06-18 DIAGNOSIS — F419 Anxiety disorder, unspecified: Secondary | ICD-10-CM

## 2022-06-18 DIAGNOSIS — Z9861 Coronary angioplasty status: Secondary | ICD-10-CM

## 2022-06-18 DIAGNOSIS — E1122 Type 2 diabetes mellitus with diabetic chronic kidney disease: Secondary | ICD-10-CM

## 2022-06-18 DIAGNOSIS — I2510 Atherosclerotic heart disease of native coronary artery without angina pectoris: Secondary | ICD-10-CM

## 2022-06-18 DIAGNOSIS — F32A Depression, unspecified: Secondary | ICD-10-CM

## 2022-06-18 DIAGNOSIS — E785 Hyperlipidemia, unspecified: Secondary | ICD-10-CM

## 2022-06-18 DIAGNOSIS — I10 Essential (primary) hypertension: Secondary | ICD-10-CM

## 2022-06-18 DIAGNOSIS — N184 Chronic kidney disease, stage 4 (severe): Secondary | ICD-10-CM

## 2022-06-18 MED ORDER — CLONIDINE 0.2 MG/24HR TD PTWK: 0.2000 mg | MEDICATED_PATCH | TRANSDERMAL | 3 refills | Status: AC

## 2022-06-18 MED ORDER — ISOSORBIDE MONONITRATE ER 30 MG PO TB24: 15.0000 mg | ORAL_TABLET | Freq: Every day | ORAL | 3 refills | Status: DC

## 2022-06-18 MED ORDER — AMLODIPINE BESYLATE 10 MG PO TABS: 10.0000 mg | ORAL_TABLET | Freq: Every day | ORAL | 3 refills | Status: DC

## 2022-06-18 MED ORDER — CARVEDILOL 25 MG PO TABS: ORAL_TABLET | ORAL | 3 refills | Status: AC

## 2022-06-18 MED ORDER — ATORVASTATIN CALCIUM 80 MG PO TABS: 80.0000 mg | ORAL_TABLET | Freq: Every evening | ORAL | 3 refills | Status: AC

## 2022-06-18 MED ORDER — NITROGLYCERIN 0.4 MG SL SUBL: 0.4000 mg | SUBLINGUAL_TABLET | SUBLINGUAL | 11 refills | Status: AC | PRN

## 2022-06-18 MED ORDER — HYDRALAZINE HCL 100 MG PO TABS: 100.0000 mg | ORAL_TABLET | Freq: Three times a day (TID) | ORAL | 3 refills | Status: AC

## 2022-06-18 MED ORDER — GLIMEPIRIDE 1 MG PO TABS: 1.0000 mg | ORAL_TABLET | Freq: Every day | ORAL | 3 refills | Status: AC

## 2022-06-18 MED ORDER — SERTRALINE HCL 100 MG PO TABS: ORAL_TABLET | ORAL | 0 refills | Status: DC

## 2022-06-18 MED ORDER — FUROSEMIDE 20 MG PO TABS: 20.0000 mg | ORAL_TABLET | Freq: Every day | ORAL | 3 refills | Status: AC

## 2022-06-18 NOTE — Patient Instructions (Signed)
Medication Instructions:   Refill sent to the pharmacy electronically.   START FUROSEMIDE 20 MG ONCE DAILY  *If you need a refill on your cardiac medications before your next appointment, please call your pharmacy*   Lab Work:  Your physician recommends that you return for lab work in: ONE WEEK-DO NOT NEED TO FAST  Your physician recommends that you return for lab work in: 8 [facility]  If you have labs (blood work) drawn today and your tests are completely normal, you will receive your results only by: [place] (if you have MyChart) OR A paper copy in the mail If you have any lab test that is abnormal or we need to change your treatment, we will call you to review the results.   Follow-Up: At [facility], you and your health needs are our priority.  As part of our continuing mission to provide you with exceptional heart care, we have created designated Provider Care Teams.  These Care Teams include your primary Cardiologist (physician) and Advanced Practice Providers (APPs -  Physician Assistants and Nurse Practitioners) who all work together to provide you with the care you need, when you need it.  We recommend signing up for the patient portal called "MyChart".  Sign up information is provided on this After Visit Summary.  MyChart is used to connect with patients for Virtual Visits (Telemedicine).  Patients are able to view lab/test results, encounter notes, upcoming appointments, etc.  Non-urgent messages can be sent to your provider as well.   To learn more about what you can do with MyChart, go to [email or web address].    Your next appointment:    AS NEEDED

## 2022-06-21 ENCOUNTER — Encounter: Payer: Self-pay | Admitting: Cardiology

## 2022-07-21 ENCOUNTER — Encounter: Payer: Self-pay | Admitting: *Deleted

## 2022-08-05 ENCOUNTER — Telehealth: Payer: Self-pay | Admitting: Cardiology

## 2022-08-05 NOTE — Telephone Encounter (Signed)
08-05-2022 - Left voicemail to verify mailing address due to returned mail - J. Vevelyn Royals

## 2022-08-07 ENCOUNTER — Telehealth: Payer: Self-pay | Admitting: Cardiology

## 2022-08-07 NOTE — Telephone Encounter (Signed)
Left voicemail (2x) to verify mailing address due to returned mail. - [name] (08-07-2022)

## 2022-08-12 ENCOUNTER — Telehealth: Payer: Self-pay | Admitting: Cardiology

## 2022-08-12 NOTE — Telephone Encounter (Signed)
Left voicemail to verify address (3x) due to returned mail. 08-12-2022 Noreene Larsson)

## 2022-09-09 ENCOUNTER — Other Ambulatory Visit: Payer: Self-pay | Admitting: Cardiology

## 2022-09-09 DIAGNOSIS — F419 Anxiety disorder, unspecified: Secondary | ICD-10-CM

## 2022-09-10 MED ORDER — SERTRALINE HCL 100 MG PO TABS: ORAL_TABLET | ORAL | 0 refills | Status: AC

## 2022-09-14 ENCOUNTER — Encounter: Payer: Self-pay | Admitting: *Deleted

## 2022-10-08 IMAGING — DX DG CHEST 2V
2 series · 2 of 2 positions shown · non-contrast
Comparison: 08/04/2020.

CLINICAL DATA: Chest pain.

EXAM:
CHEST - 2 VIEW

[w chest pa]
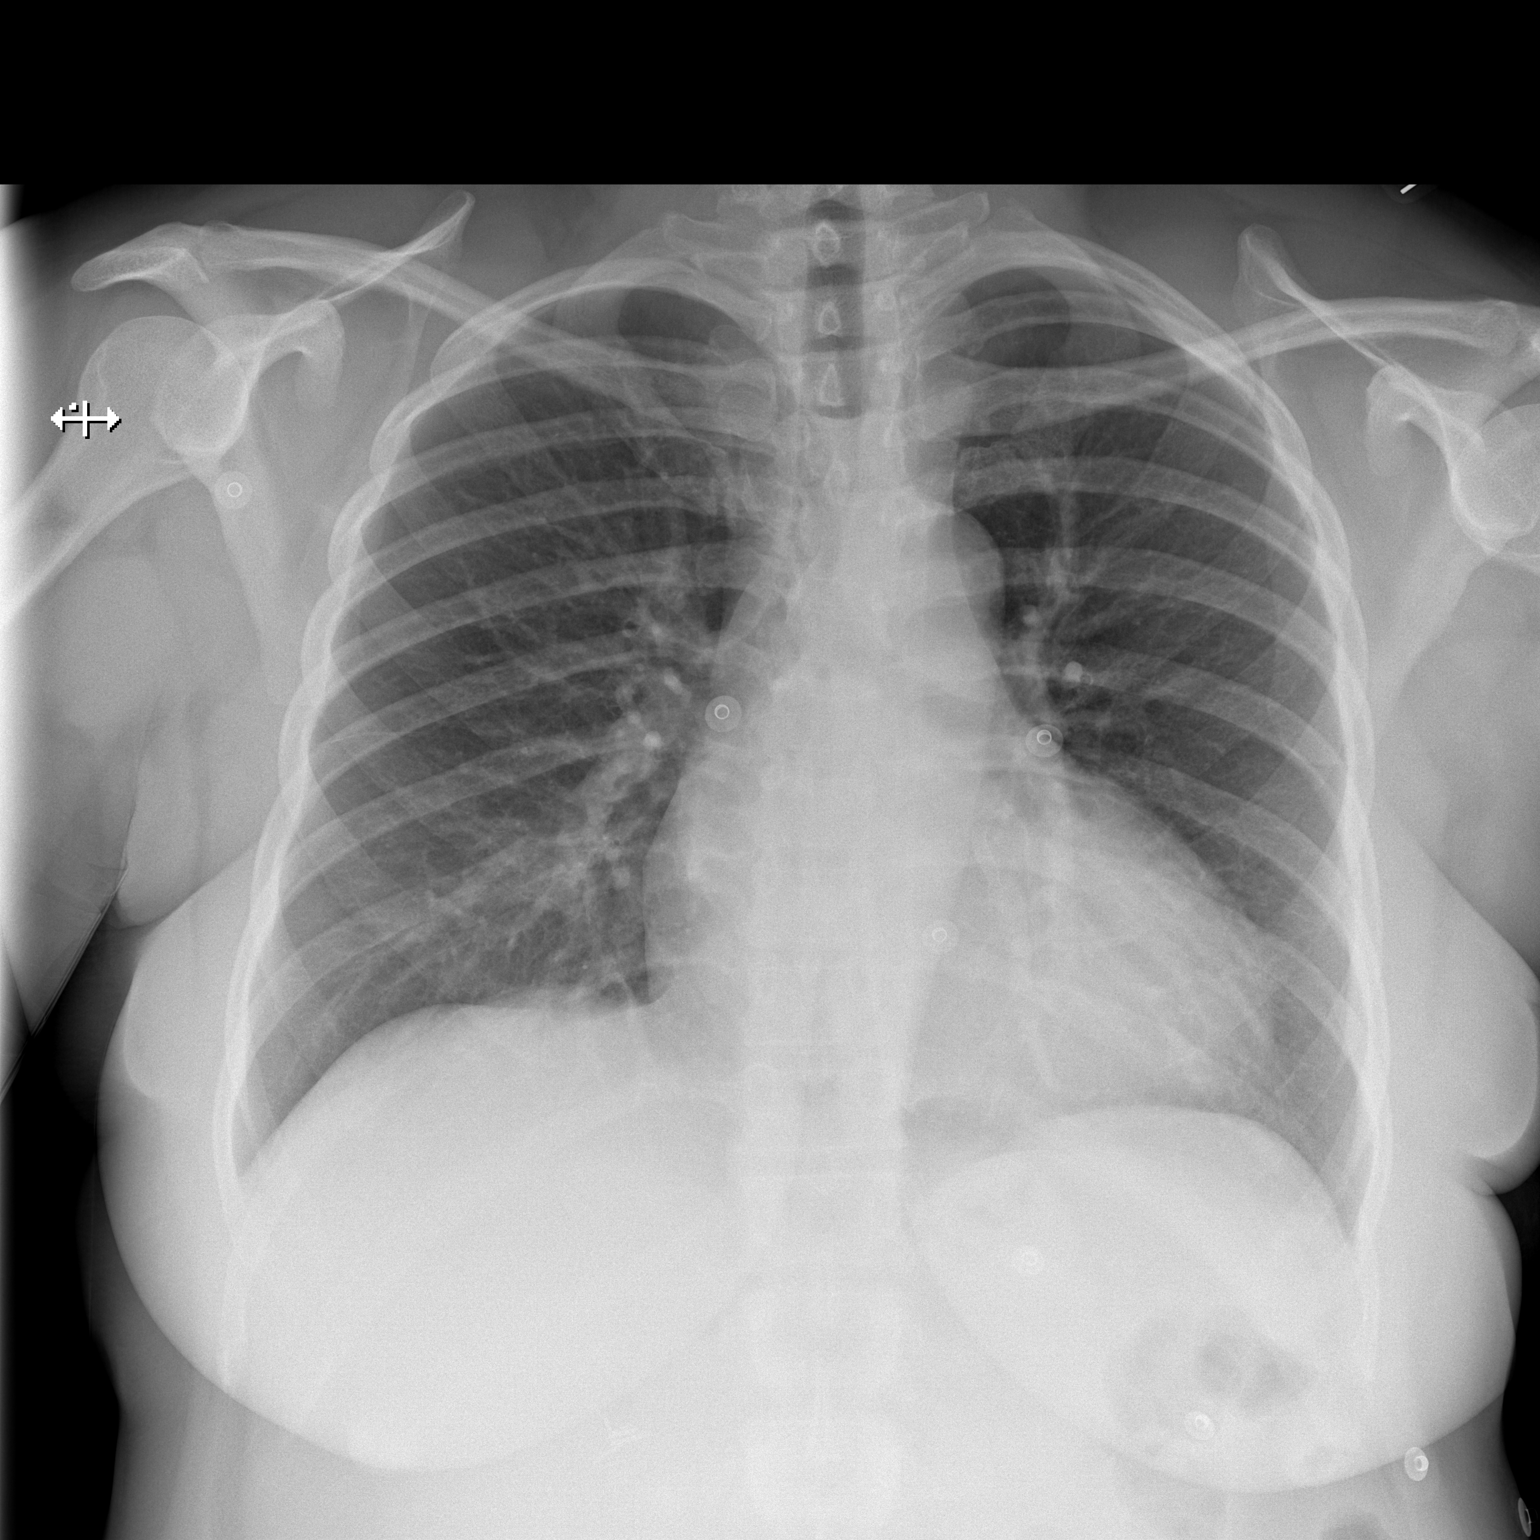

[w chest lat]
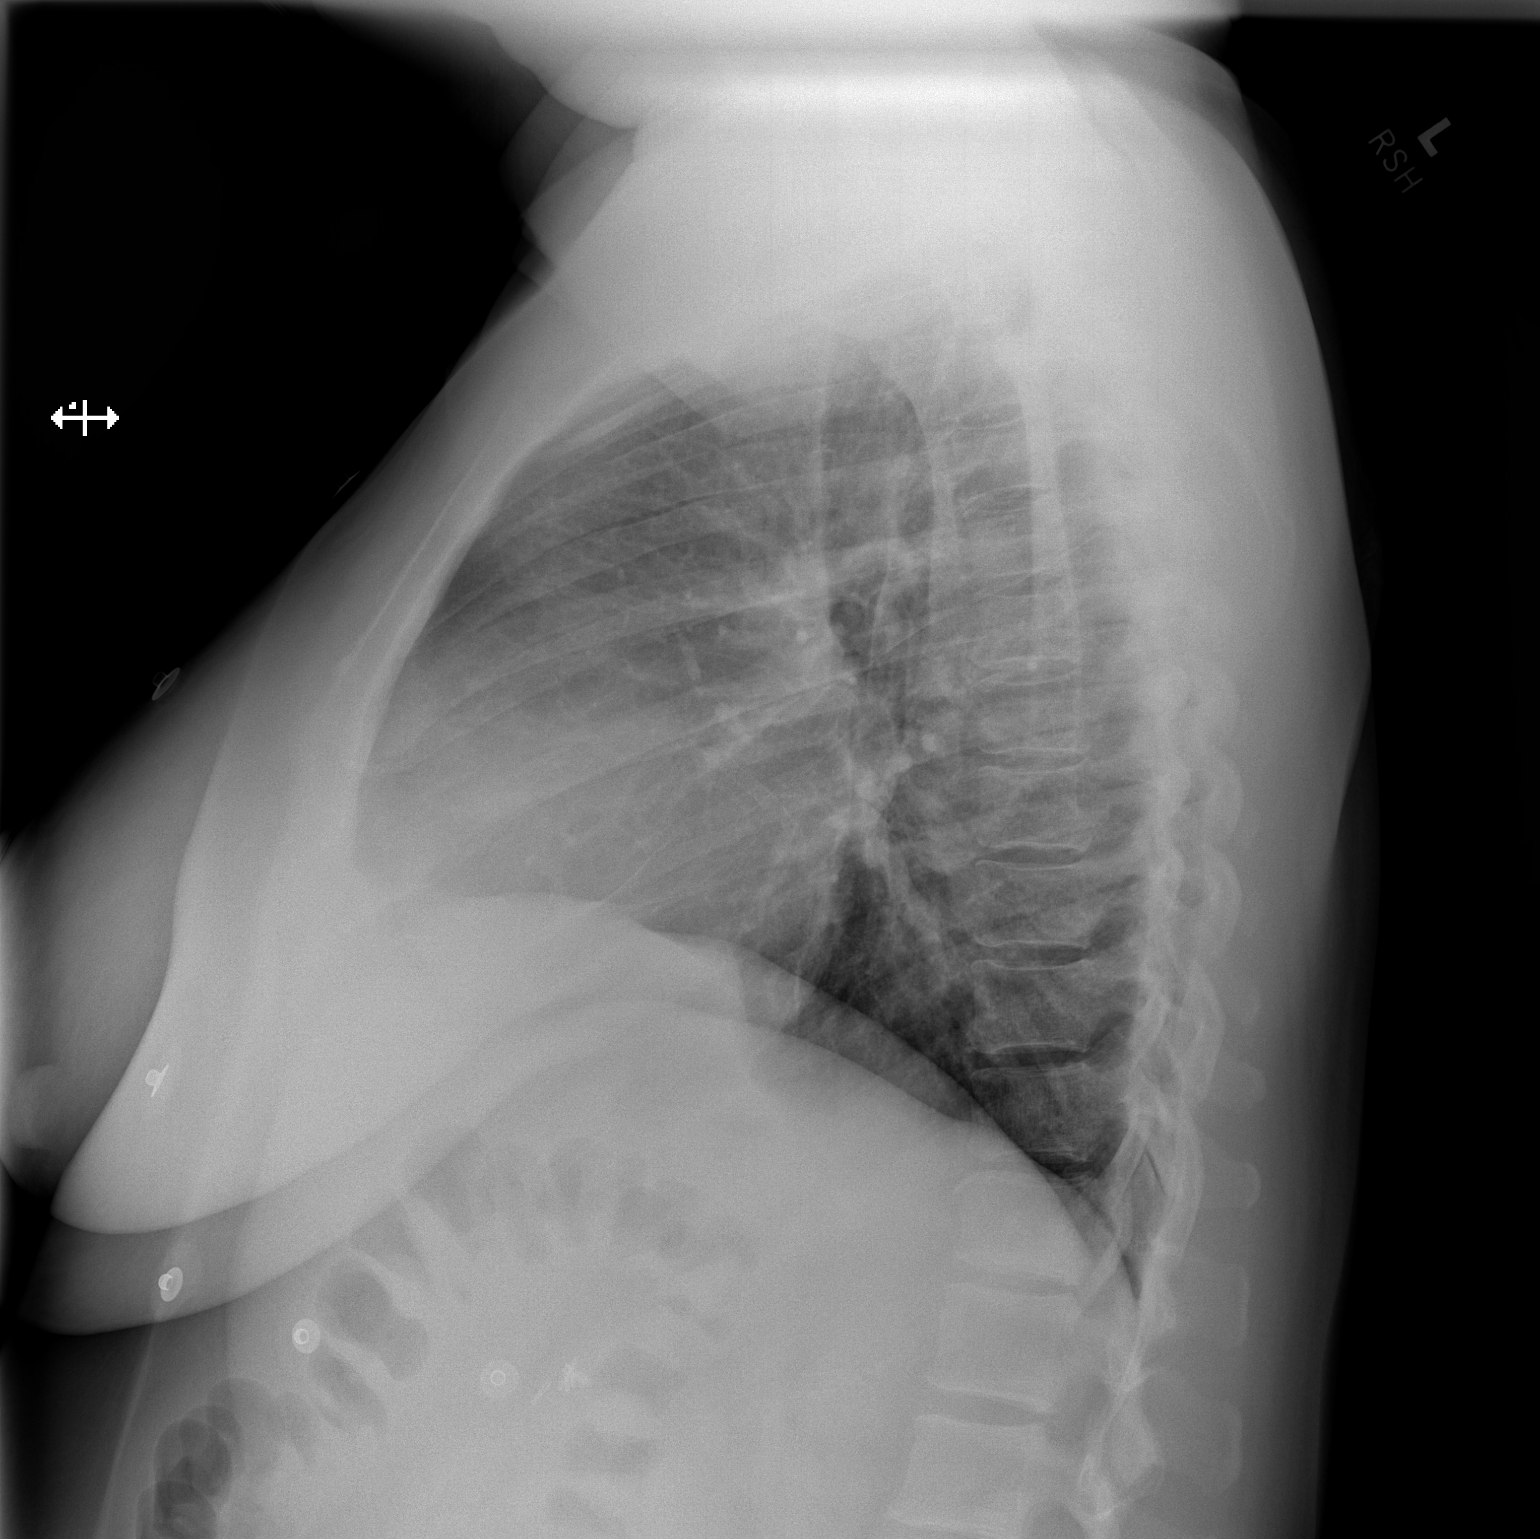

[2 of 2 positions shown; findings below may reference images not displayed]

FINDINGS: The heart is mildly enlarged and the mediastinal contour is within
normal limits. No consolidation, effusion, or pneumothorax. No acute
osseous abnormality. Surgical clips are present in the right upper
quadrant.
IMPRESSION: Mild cardiomegaly with no acute process.

## 2023-01-11 ENCOUNTER — Other Ambulatory Visit: Payer: Self-pay | Admitting: Family Medicine

## 2023-01-11 DIAGNOSIS — E1122 Type 2 diabetes mellitus with diabetic chronic kidney disease: Secondary | ICD-10-CM

## 2023-03-11 ENCOUNTER — Other Ambulatory Visit: Payer: Self-pay | Admitting: Family Medicine

## 2023-03-11 DIAGNOSIS — E1122 Type 2 diabetes mellitus with diabetic chronic kidney disease: Secondary | ICD-10-CM

## 2023-03-12 ENCOUNTER — Other Ambulatory Visit: Payer: Self-pay | Admitting: Family Medicine

## 2023-03-12 DIAGNOSIS — F32A Depression, unspecified: Secondary | ICD-10-CM

## 2023-03-23 ENCOUNTER — Other Ambulatory Visit: Payer: Self-pay | Admitting: Family Medicine

## 2023-03-23 DIAGNOSIS — E1122 Type 2 diabetes mellitus with diabetic chronic kidney disease: Secondary | ICD-10-CM

## 2023-03-24 ENCOUNTER — Other Ambulatory Visit: Payer: Self-pay | Admitting: Family Medicine

## 2023-03-24 DIAGNOSIS — F419 Anxiety disorder, unspecified: Secondary | ICD-10-CM

## 2023-04-26 ENCOUNTER — Other Ambulatory Visit: Payer: Self-pay | Admitting: Family Medicine

## 2023-04-26 DIAGNOSIS — F419 Anxiety disorder, unspecified: Secondary | ICD-10-CM

## 2023-07-15 ENCOUNTER — Other Ambulatory Visit: Payer: Self-pay | Admitting: Cardiology

## 2023-07-15 DIAGNOSIS — I10 Essential (primary) hypertension: Secondary | ICD-10-CM

## 2024-06-27 ENCOUNTER — Other Ambulatory Visit: Payer: Self-pay

## 2024-06-27 DIAGNOSIS — Z1231 Encounter for screening mammogram for malignant neoplasm of breast: Secondary | ICD-10-CM

## 2024-07-03 ENCOUNTER — Encounter: Payer: Self-pay | Admitting: *Deleted

## 2024-07-05 ENCOUNTER — Encounter: Payer: Self-pay | Admitting: *Deleted

## 2024-07-06 ENCOUNTER — Ambulatory Visit: Payer: Self-pay | Attending: Emergency Medicine | Admitting: Emergency Medicine

## 2024-07-06 NOTE — Progress Notes (Deleted)
 Cardiology Office Note:    Date:  07/06/2024  ID:  Casey Acosta, DOB [birth year], MRN [id number] PCP: No primary care provider on file.  [facility] Providers Cardiologist:  Redell Shallow, MD { Click to update primary MD,subspecialty MD or APP then REFRESH:1}    {Click to Open Review  :1}   Patient Profile:       Chief Complaint: *** History of Present Illness:  Casey Acosta is a 53 y.o. female with visit-pertinent history of coronary artery disease, hypertension, hyperlipidemia, aortic valve calcification, chronic stage IV kidney disease, lower extremity edema  Transesophageal echocardiogram in September 2021 showed normal LV function, nodular thickening of the aortic valve.  Patient suffered an inferolateral myocardial infarction in December 2021.  Cardiac catheterization showed 60% proximal LAD, 50% mid LAD, 80% first diagonal, 50% ramus, occluded circumflex, 80% proximal to mid right coronary artery, 80% LPAV, 90% third posterior lateral.  She underwent PCI of the left circumflex at that time.  She also has stage IV chronic kidney disease.  Echocardiogram January 2023 showed normal LV function, mild LVH, grade 1 DD, moderate left atrial enlargement, small pericardial effusion, mild mitral regurgitation, calcification of the aortic valve.  Nuclear study in February 2023 showed prior myocardial infarction and small area of ischemia at the apex, study interpreted as high risk.  She was last seen in clinic on 06/18/2022.  Plan was for medical therapy given that most recent nuclear study showed small area of ischemia.  She was continued on aspirin  and statin.  Her blood pressure was elevated during office visit but she had not taken her medications in 1 week.  She was to resume all of her previous medications.  She had noticed some pedal edema.  She was started on Lasix  20 mg daily.  It was noted patient was moving to [place]  when she was to find a cardiologist there.  She underwent  echocardiogram in [place]  on 06/14/2024 showing mildly reduced systolic function with a EF of 50%, LV mildly dilated, small pericardial effusion, no evidence of tamponade.  Followed by transplant team in regard to potential suitability for kidney transplant at [facility] with last visit 10/15 and noted at that time.  She was recently seen by general surgery with [facility] on 06/21/2024 for preoperative visit for laparoscopic catheter insertion.  Patient had 1 episode of chest discomfort in the setting of being off antihypertensive therapy.  Her medications were resumed.  Patient does not see a cardiologist regularly since moving back here from [place] .  She was advised to reestablish with cardiology in [place].  Discussed the use of AI scribe software for clinical note transcription with the patient, who gave verbal consent to proceed.  History of Present Illness     Review of systems:  Please see the history of present illness. All other systems are reviewed and otherwise negative. ***      Studies Reviewed:        ***  Risk Assessment/Calculations:   {Does this patient have ATRIAL FIBRILLATION?:[phone]} No BP recorded.  {Refresh Note OR Click here to enter BP  :1}***        Physical Exam:   VS:  There were no vitals taken for this visit.   Wt Readings from Last 3 Encounters:  06/18/22 214 lb 3.2 oz (97.2 kg)  10/18/21 202 lb 14.4 oz (92 kg)  09/17/21 213 lb (96.6 kg)    GEN: Well nourished, well developed in no acute distress NECK: No JVD; No carotid bruits CARDIAC: ***  RRR, no murmurs, rubs, gallops RESPIRATORY:  Clear to auscultation without rales, wheezing or rhonchi  ABDOMEN: Soft, non-tender, non-distended EXTREMITIES:  No edema; No acute deformity ***      Assessment and Plan:   Assessment & Plan      {Are you ordering a CV Procedure (e.g. stress test, cath, DCCV, TEE, etc)?   Press F2        :[id number]}  Dispo:  No follow-ups on file.  Signed, Lum LITTIE Louis, NP

## 2024-07-12 ENCOUNTER — Ambulatory Visit (HOSPITAL_COMMUNITY)
Admission: RE | Admit: 2024-07-12 | Discharge: 2024-07-12 | Disposition: A | Discharge status: Home / Self Care | Source: Ambulatory Visit

## 2024-07-12 ENCOUNTER — Other Ambulatory Visit (HOSPITAL_COMMUNITY): Payer: Self-pay

## 2024-07-12 DIAGNOSIS — Z01818 Encounter for other preprocedural examination: Secondary | ICD-10-CM | POA: Insufficient documentation

## 2024-07-13 ENCOUNTER — Telehealth: Payer: Self-pay | Admitting: Cardiology

## 2024-07-13 NOTE — Telephone Encounter (Signed)
 Attempted to call; no answer (Mobile sounds like fax line and Home is invalid)

## 2024-07-13 NOTE — Telephone Encounter (Signed)
 Pt c/o BP issue: STAT if pt c/o blurred vision, one-sided weakness or slurred speech.  STAT if BP is GREATER than 180/120 TODAY.  STAT if BP is LESS than 90/60 and SYMPTOMATIC TODAY  1. What is your BP concern? hypertension  2. Have you taken any BP medication today?Unknown   3. What are your last 5 BP readings?n/a   4. Are you having any other symptoms (ex. Dizziness, headache, blurred vision, passed out)? No   Pt would like a c/b from Dr Pietro nurse she had additional questions that she would like to discuss  Please Advise

## 2024-07-18 ENCOUNTER — Ambulatory Visit: Admission: RE | Admit: 2024-07-18 | Discharge: 2024-07-18 | Disposition: A | Payer: Self-pay | Source: Ambulatory Visit

## 2024-07-18 DIAGNOSIS — Z1231 Encounter for screening mammogram for malignant neoplasm of breast: Secondary | ICD-10-CM

## 2024-07-19 NOTE — Telephone Encounter (Signed)
 Attempted to contact patient, no answer (intermittent fax sounds came over the line). Home phone number is not in service.

## 2024-07-24 ENCOUNTER — Other Ambulatory Visit: Payer: Self-pay

## 2024-07-24 DIAGNOSIS — R928 Other abnormal and inconclusive findings on diagnostic imaging of breast: Secondary | ICD-10-CM

## 2024-07-24 NOTE — Telephone Encounter (Signed)
 Unable to reach pt or leave a message, mobile number is a fax machine and home number is disconnected.

## 2024-09-04 NOTE — Progress Notes (Signed)
 "    HPI: FU coronary artery disease. Transesophageal echocardiogram September 2021 showed normal LV function, nodular thickening of the aortic valve. Patient suffered an inferolateral myocardial infarction December 2021. Cardiac catheterization revealed 60% proximal LAD, 50% mid LAD, 80% first diagonal, 50% ramus, occluded circumflex, 80% proximal to mid right coronary artery, 80% LPAV, 90% third posterior lateral. Patient had PCI of left circumflex at that time.  Also has stage IV kidney disease. Nuclear study February 2023 showed prior myocardial infarction and small area of ischemia at the apex; study interpreted as high risk.  Echocardiogram October 2025 at [facility] showed ejection fraction 50%, mild left ventricular hypertrophy, mild mitral regurgitation and small pericardial effusion.  Since last seen October 2023 she has started dialysis.  She has dyspnea with more vigorous activities but not routine activities.  No orthopnea, PND, pedal edema or chest pain.  Current Outpatient Medications  Medication Sig Dispense Refill   amLODipine  (NORVASC ) 10 MG tablet Take 1 tablet by mouth once daily 90 tablet 3   aspirin  EC 81 MG tablet Take 81 mg by mouth daily.     aspirin -acetaminophen -caffeine (EXCEDRIN MIGRAINE) 250-250-65 MG tablet Take 2 tablets by mouth every 6 (six) hours as needed for headache or migraine.     atorvastatin  (LIPITOR ) 80 MG tablet Take 1 tablet (80 mg total) by mouth at bedtime. 90 tablet 3   calcitRIOL (ROCALTROL) 0.25 MCG capsule Take 0.25 mcg by mouth daily.     carvedilol  (COREG ) 25 MG tablet TAKE ONE TABLET BY MOUTH TWO TIMES A DAY 180 tablet 3   cloNIDine  (CATAPRES  - DOSED IN MG/24 HR) 0.2 mg/24hr patch Place 1 patch (0.2 mg total) onto the skin every Saturday. 12 patch 3   cyclobenzaprine  (FLEXERIL ) 5 MG tablet Take 1 tablet (5 mg total) by mouth at bedtime. 30 tablet 1   hydrALAZINE  (APRESOLINE ) 100 MG tablet Take 1 tablet (100 mg total) by mouth 3 (three) times daily. 270  tablet 3   hydrOXYzine  (ATARAX /VISTARIL ) 50 MG tablet Take 1 tablet (50 mg total) by mouth every 6 (six) hours as needed for anxiety. 60 tablet 3   loratadine (CLARITIN) 10 MG tablet Take 10 mg by mouth daily as needed for allergies.     Multiple Vitamin (MULTIVITAMIN PO) Take 1 tablet by mouth daily.     nitroGLYCERIN  (NITROSTAT ) 0.4 MG SL tablet Place 1 tablet (0.4 mg total) under the tongue every 5 (five) minutes as needed for chest pain. 25 tablet 11   blood glucose meter kit and supplies KIT Dispense based on patient and insurance preference. Use up to four times daily as directed. 1 each 0   furosemide  (LASIX ) 20 MG tablet Take 1 tablet (20 mg total) by mouth daily. (Patient not taking: Reported on 09/14/2024) 90 tablet 3   glimepiride  (AMARYL ) 1 MG tablet Take 1 tablet (1 mg total) by mouth daily with breakfast. (Patient not taking: Reported on 09/14/2024) 90 tablet 3   glucose blood test strip One-step Ultra meter.  Use as instructed 100 each 12   isosorbide  mononitrate (IMDUR ) 30 MG 24 hr tablet Take 1 tablet (30 mg total) by mouth daily. 45 tablet 1   Multiple Vitamins-Minerals (HAIR SKIN AND NAILS FORMULA PO) Take 1 tablet by mouth daily.     sertraline  (ZOLOFT ) 100 MG tablet TAKE ONE-HALF (0.5) TABLET BY MOUTH DAILY 45 tablet 0   torsemide (DEMADEX) 100 MG tablet Take 100 mg by mouth daily.     valsartan (DIOVAN) 320 MG tablet  Take 320 mg by mouth daily.     No current facility-administered medications for this visit.     Past Medical History:  Diagnosis Date   Anemia    Anxiety and depression    Chronic kidney disease    Colon polyp    Diabetes mellitus without complication (HCC)    High risk sexual behavior    History of syncope    Hyperlipidemia    Hypertension    Internal hemorrhoids    LVH (left ventricular hypertrophy)    Obesity (BMI 30-39.9)    Ovarian mass    Panic disorder    PCOS (polycystic ovarian syndrome)    STEMI (ST elevation myocardial infarction) (HCC)  07/2020   Surgical menopause    Vaginal trichomoniasis     Past Surgical History:  Procedure Laterality Date   ABDOMINAL HYSTERECTOMY     partial   APPENDECTOMY     CHOLECYSTECTOMY     CORONARY/GRAFT ACUTE MI REVASCULARIZATION N/A 08/04/2020   Procedure: Coronary/Graft Acute MI Revascularization;  Surgeon: Jordan, Peter M, MD;  Location: [facility] INVASIVE CV LAB;  Service: Cardiovascular;  Laterality: N/A;   LEFT HEART CATH AND CORONARY ANGIOGRAPHY N/A 08/04/2020   Procedure: LEFT HEART CATH AND CORONARY ANGIOGRAPHY;  Surgeon: Jordan, Peter M, MD;  Location: [facility] INVASIVE CV LAB;  Service: Cardiovascular;  Laterality: N/A;   TEE WITHOUT CARDIOVERSION N/A 05/27/2020   Procedure: TRANSESOPHAGEAL ECHOCARDIOGRAM (TEE);  Surgeon: Delford Maude BROCKS, MD;  Location: [facility] ENDOSCOPY;  Service: Cardiovascular;  Laterality: N/A;   TUBAL LIGATION      Social History   Socioeconomic History   Marital status: Married    Spouse name: Not on file   Number of children: Not on file   Years of education: Not on file   Highest education level: Not on file  Occupational History   Occupation: nurse  Tobacco Use   Smoking status: Never   Smokeless tobacco: Never  Vaping Use   Vaping status: Never Used  Substance and Sexual Activity   Alcohol use: Not Currently    Comment: Occas. wine   Drug use: No   Sexual activity: Yes    Birth control/protection: Surgical    Comment: HYST.-1st intercourse 54 yo-More than 5 partners  Other Topics Concern   Not on file  Social History Narrative   Not on file   Social Drivers of Health   Tobacco Use: Low Risk (09/14/2024)   Patient History    Smoking Tobacco Use: Never    Smokeless Tobacco Use: Never    Passive Exposure: Not on file  Financial Resource Strain: Patient Declined (06/19/2024)   Received from [facility]   Overall Financial Resource Strain (CARDIA)    How hard is it for you to pay for the very basics like food, housing, medical care, and heating?:  Patient declined  Food Insecurity: Patient Declined (06/26/2024)   Received from [facility]   Epic    Within the past 12 months, you worried that your food would run out before you got the money to buy more.: Patient declined    Within the past 12 months, the food you bought just didn't last and you didn't have money to get more.: Patient declined  Transportation Needs: Patient Declined (06/26/2024)   Received from [facility]    In the past 12 months, has lack of transportation kept you from medical appointments or from getting medications?: Patient declined    In the past 12 months, has  lack of transportation kept you from meetings, work, or from getting things needed for daily living?: Patient declined  Physical Activity: Not on file  Stress: No Stress Concern Present (06/26/2024)   Received from [facility] - Occupational Stress Questionnaire    Do you feel stress - tense, restless, nervous, or anxious, or unable to sleep at night because your mind is troubled all the time - these days?: Not at all  Social Connections: Not on file  Intimate Partner Violence: Not on file  Depression (PHQ2-9): Not on file  Alcohol Screen: Not on file  Housing: Unknown (06/26/2024)   Received from [facility]    In the last 12 months, was there a time when you were not able to pay the mortgage or rent on time?: Patient declined    In the past 12 months, how many times have you moved where you were living?: 0    At any time in the past 12 months, were you homeless or living in a shelter (including now)?: Patient declined  Utilities: Patient Declined (06/26/2024)   Received from [facility]    In the past 12 months has the electric, gas, oil, or water company threatened to shut off services in your home?: Patient declined  Health Literacy: Not on file    Family History  Problem Relation Age of Onset   Breast cancer Mother     Anxiety disorder Mother    Hyperlipidemia Mother    Depression Mother    Hypertension Mother    Diabetes Mother    Thyroid  disease Mother    ALS Father    Hypertension Sister    Diabetes Sister    Crohn's disease Sister    Breast cancer Maternal Aunt    Breast cancer Maternal Aunt    Crohn's disease Brother     ROS: no fevers or chills, productive cough, hemoptysis, dysphasia, odynophagia, melena, hematochezia, dysuria, hematuria, rash, seizure activity, orthopnea, PND, pedal edema, claudication. Remaining systems are negative.  Physical Exam: Well-developed well-nourished in no acute distress.  Skin is warm and dry.  HEENT is normal.  Neck is supple.  Chest is clear to auscultation with normal expansion.  Cardiovascular exam is regular rate and rhythm.  Abdominal exam nontender or distended. No masses palpated. Extremities show no edema. neuro grossly intact  EKG Interpretation Date/Time:  Thursday September 14 2024 10:00:53 EST Ventricular Rate:  85 PR Interval:  142 QRS Duration:  96 QT Interval:  372 QTC Calculation: 442 R Axis:   -23  Text Interpretation: Normal sinus rhythm Left ventricular hypertrophy with repolarization abnormality ( R in aVL , Cornell product ) Confirmed by Pietro Rogue ([id number]) on 09/14/2024 10:01:53 AM    A/P  1 coronary artery disease-patient denies chest pain.  Plan to continue medical therapy with aspirin  and statin.  2 hypertension-blood pressure controlled.  Continue present medical regimen.  3 hyperlipidemia-continue statin.  4 end-stage renal disease-patient has been initiated on peritoneal dialysis.  She has been placed on the transplant list.  She requires a functional study prior to full listing.  She also has dyspnea with more vigorous activities.  Will arrange stress PET both for preoperative restratification and to screen for ischemia as a cause of her dyspnea.  5 history of lower extremity edema-continue Lasix .  Rogue Pietro, MD    "

## 2024-09-12 ENCOUNTER — Ambulatory Visit: Admission: RE | Admit: 2024-09-12 | Discharge: 2024-09-12 | Disposition: A | Source: Ambulatory Visit

## 2024-09-12 ENCOUNTER — Other Ambulatory Visit: Payer: Self-pay

## 2024-09-12 DIAGNOSIS — R928 Other abnormal and inconclusive findings on diagnostic imaging of breast: Secondary | ICD-10-CM

## 2024-09-12 DIAGNOSIS — N6312 Unspecified lump in the right breast, upper inner quadrant: Secondary | ICD-10-CM

## 2024-09-14 ENCOUNTER — Encounter: Payer: Self-pay | Admitting: Cardiology

## 2024-09-14 ENCOUNTER — Ambulatory Visit: Payer: Self-pay | Attending: Cardiology | Admitting: Cardiology

## 2024-09-14 ENCOUNTER — Other Ambulatory Visit: Payer: Self-pay | Admitting: Nephrology

## 2024-09-14 ENCOUNTER — Other Ambulatory Visit: Payer: Self-pay | Admitting: Family Medicine

## 2024-09-14 VITALS — BP 163/94 | HR 85 | Ht 66.0 in | Wt 207.0 lb

## 2024-09-14 DIAGNOSIS — N6312 Unspecified lump in the right breast, upper inner quadrant: Secondary | ICD-10-CM

## 2024-09-14 DIAGNOSIS — I2510 Atherosclerotic heart disease of native coronary artery without angina pectoris: Secondary | ICD-10-CM

## 2024-09-14 DIAGNOSIS — R928 Other abnormal and inconclusive findings on diagnostic imaging of breast: Secondary | ICD-10-CM

## 2024-09-14 DIAGNOSIS — I10 Essential (primary) hypertension: Secondary | ICD-10-CM

## 2024-09-14 DIAGNOSIS — R0609 Other forms of dyspnea: Secondary | ICD-10-CM | POA: Diagnosis not present

## 2024-09-14 DIAGNOSIS — E7800 Pure hypercholesterolemia, unspecified: Secondary | ICD-10-CM | POA: Diagnosis not present

## 2024-09-14 DIAGNOSIS — Z01810 Encounter for preprocedural cardiovascular examination: Secondary | ICD-10-CM | POA: Diagnosis not present

## 2024-09-14 NOTE — Patient Instructions (Addendum)
 Medication Instructions:  No Changes *If you need a refill on your cardiac medications before your next appointment, please call your pharmacy*  Lab Work: None  Testing/Procedures:  Dr. Pietro has ordered a Cardiac PET Stress Test at one of the following locations (I have requested for the [facility] location); someone will call you for an appointment.   Please report to Radiology at the [facility] Main Entrance 30 minutes early for your test.  [address] [place], [place] [address]                         OR   Please report to Radiology at [facility] Main Entrance, medical mall, 30 mins prior to your test.  [address]  [place], [place]  How to Prepare for Your Cardiac PET/CT Stress Test:  Nothing to eat or drink, except water, 3 hours prior to arrival time.  NO caffeine/decaffeinated products, or chocolate 12 hours prior to arrival. (Please note decaffeinated beverages (teas/coffees) still contain caffeine).  If you have caffeine within 12 hours prior, the test will need to be rescheduled.  Medication instructions: Do not take erectile dysfunction medications for 72 hours prior to test (sildenafil, tadalafil) Do not take nitrates (isosorbide  mononitrate, Ranexa) the day before or day of test Do not take tamsulosin the day before or morning of test Hold theophylline containing medications for 12 hours. Hold Dipyridamole 48 hours prior to the test.  Diabetic Preparation: If able to eat breakfast prior to 3 hour fasting, you may take all medications, including your insulin . Do not worry if you miss your breakfast dose of insulin  - start at your next meal. If you do not eat prior to 3 hour fast-Hold all diabetes (oral and insulin ) medications. Patients who wear a continuous glucose monitor MUST remove the device prior to scanning.  You may take your remaining medications with water.  NO perfume, cologne or lotion on chest  or abdomen area. FEMALES - Please avoid wearing dresses to this appointment.  Total time is 1 to 2 hours; you may want to bring reading material for the waiting time.  IF YOU THINK YOU MAY BE PREGNANT, OR ARE NURSING PLEASE INFORM THE TECHNOLOGIST.  In preparation for your appointment, medication and supplies will be purchased.  Appointment availability is limited, so if you need to cancel or reschedule, please call the Radiology Department Scheduler at [phone] 24 hours in advance to avoid a cancellation fee of $100.00  What to Expect When you Arrive:  Once you arrive and check in for your appointment, you will be taken to a preparation room within the Radiology Department.  A technologist or Nurse will obtain your medical history, verify that you are correctly prepped for the exam, and explain the procedure.  Afterwards, an IV will be started in your arm and electrodes will be placed on your skin for EKG monitoring during the stress portion of the exam. Then you will be escorted to the PET/CT scanner.  There, staff will get you positioned on the scanner and obtain a blood pressure and EKG.  During the exam, you will continue to be connected to the EKG and blood pressure machines.  A small, safe amount of a radioactive tracer will be injected in your IV to obtain a series of pictures of your heart along with an injection of a stress agent.    After your Exam:  It is recommended that you  eat a meal and drink a caffeinated beverage to counter act any effects of the stress agent.  Drink plenty of fluids for the remainder of the day and urinate frequently for the first couple of hours after the exam.  Your doctor will inform you of your test results within 7-10 business days.  For more information and frequently asked questions, please visit our website: [email or web address]  For questions about your test or how to prepare for your test, please call: Cardiac Imaging Nurse  Navigators Office: [phone]   Follow-Up: At [facility], you and your health needs are our priority.  As part of our continuing mission to provide you with exceptional heart care, our providers are all part of one team.  This team includes your primary Cardiologist (physician) and Advanced Practice Providers or APPs (Physician Assistants and Nurse Practitioners) who all work together to provide you with the care you need, when you need it.  Your next appointment:   1 year(s)  Provider:   Redell Shallow, MD

## 2024-09-15 ENCOUNTER — Ambulatory Visit: Admission: RE | Admit: 2024-09-15 | Source: Ambulatory Visit

## 2024-09-15 ENCOUNTER — Inpatient Hospital Stay: Admission: RE | Admit: 2024-09-15

## 2024-09-15 DIAGNOSIS — N6312 Unspecified lump in the right breast, upper inner quadrant: Secondary | ICD-10-CM

## 2024-09-15 DIAGNOSIS — R928 Other abnormal and inconclusive findings on diagnostic imaging of breast: Secondary | ICD-10-CM

## 2024-09-15 HISTORY — PX: BREAST BIOPSY: SHX20

## 2024-09-18 LAB — SURGICAL PATHOLOGY

## 2024-09-19 ENCOUNTER — Telehealth: Payer: Self-pay

## 2024-09-19 NOTE — Telephone Encounter (Signed)
 Called patient and left a VM to return call, a Stat order was faxed over to our office from the [facility] center, Dr. Mercer wouldn't be able to sing the order due to patient starting care with  Arnulfo Jenna Sharps, MD (Aug. 15, 2014August 15, 2014 - Present) NPI: [id number] [phone] (Work) [phone] (Fax) [address] [place] [place], [place] [address] Family Medicine [facility] [address] [place], [place] [address]

## 2024-10-11 ENCOUNTER — Ambulatory Visit: Admitting: Family Medicine

## 2024-10-12 ENCOUNTER — Ambulatory Visit

## 2024-11-23 ENCOUNTER — Ambulatory Visit: Admitting: Family Medicine
